# Patient Record
Sex: Female | Born: 1985 | Race: White | Hispanic: No | Marital: Single | Smoking: Former smoker
Health system: Southern US, Community
[De-identification: ages and names within clinical notes are randomized; demographics above are authoritative.]

## PROBLEM LIST (undated history)

## (undated) DIAGNOSIS — L7682 Other postprocedural complications of skin and subcutaneous tissue: Principal | ICD-10-CM

## (undated) DIAGNOSIS — R42 Dizziness and giddiness: Secondary | ICD-10-CM

## (undated) DIAGNOSIS — J45909 Unspecified asthma, uncomplicated: Secondary | ICD-10-CM

## (undated) HISTORY — DX: Unspecified asthma, uncomplicated: J45.909

## (undated) HISTORY — DX: Other postprocedural complications of skin and subcutaneous tissue: L76.82

---
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
## 2010-10-15 ENCOUNTER — Emergency Department (HOSPITAL_COMMUNITY)
Admission: EM | Admit: 2010-10-15 | Discharge: 2010-10-15 | Disposition: A | Discharge status: Home / Self Care | Payer: Medicaid Other | Attending: Emergency Medicine | Admitting: Emergency Medicine

## 2010-10-15 DIAGNOSIS — H7290 Unspecified perforation of tympanic membrane, unspecified ear: Secondary | ICD-10-CM | POA: Insufficient documentation

## 2010-10-15 DIAGNOSIS — R112 Nausea with vomiting, unspecified: Secondary | ICD-10-CM | POA: Insufficient documentation

## 2010-10-15 DIAGNOSIS — R42 Dizziness and giddiness: Secondary | ICD-10-CM | POA: Insufficient documentation

## 2010-10-15 DIAGNOSIS — R0602 Shortness of breath: Secondary | ICD-10-CM | POA: Insufficient documentation

## 2010-10-15 DIAGNOSIS — H9209 Otalgia, unspecified ear: Secondary | ICD-10-CM | POA: Insufficient documentation

## 2010-10-15 LAB — POCT PREGNANCY, URINE: Preg Test, Ur: NEGATIVE

## 2010-10-26 ENCOUNTER — Emergency Department (HOSPITAL_COMMUNITY)
Admission: EM | Admit: 2010-10-26 | Discharge: 2010-10-26 | Disposition: A | Discharge status: Home / Self Care | Payer: Medicaid Other | Attending: Emergency Medicine | Admitting: Emergency Medicine

## 2010-10-26 DIAGNOSIS — J019 Acute sinusitis, unspecified: Secondary | ICD-10-CM | POA: Insufficient documentation

## 2010-10-26 DIAGNOSIS — H9209 Otalgia, unspecified ear: Secondary | ICD-10-CM | POA: Insufficient documentation

## 2011-01-07 NOTE — H&P (Signed)
NAMEYENNY, KOSA NO.:  [id number]   MEDICAL RECORD NO.:  [id number]          PATIENT TYPE:  INP   LOCATION:  0105                          FACILITY:  [facility]   PHYSICIAN:  Beverly Milch, MD     DATE OF BIRTH:  29-Apr-1986   DATE OF ADMISSION:  08/28/2004  DATE OF DISCHARGE:                         PSYCHIATRIC ADMISSION ASSESSMENT   IDENTIFICATION:  A 25 year old female, 11th grade student at [employer] is admitted emergently involuntarily on [facility] petition  for commitment and transfer from [facility] emergency department,  Dr. Jerilynn Birkenhead for inpatient stabilization of suicide risk and depression. The  patient had presented to the emergency room apparently with father's  girlfriend after passing out when with father's girlfriend at the  girlfriend's day care job the evening of admission. The patient arrived in  emergency room at 2051 on August 27, 2004 describing syncope that was  otherwise unexplained including an episode of fainting at school in what she  considered heat the day before. However, there were no additional  contributing physical factors at the time of admission. After negative  medical workup including CT scan of the head, the patient gradually  disclosed to the emergency room physician that she was under significant  family stress and wanted to die. She had been hyperventilating and then  struck her chest resulting in syncope in order to kill herself.   HISTORY OF PRESENT ILLNESS:  The patient hesitates to address the conflicts  and consequences in her life. She seems anxious, as well as dysphoric in  this regard. She seems very stressed that she has not seen her biological  mother in 6 years, though she knows the biological mother resides in  [place]. She thinks that mother has substance abuse disorder and is now  likely sober. However, she notes that her father does see the biological  mother in [place] and  portrays that the mother is still having problems.  The patient resides with father and his girlfriend, who apparently functions  as the patient's surrogate mother. The patient suggests that she has  conflicts with the stepmother but suggests that both of them are afraid of  the father. The patient will not be more specific about her father's anger  and angry acting out. The patient, therefore, has become progressively  dysphoric in life and just wants to die. She has nightmares of being killed  and of other means of death. The patient will not discuss all these matters.  She does describe a long history of difficulty getting an adequate breath.  She reports that she has been told by biological mother that she has asthma  in the past. She is known to have had either breath-holding spells or  hyperventilation spells when she was little, with the family conclusion that  she was making herself pass out. She has gained 20 pounds in the last 2  months. She notes particular difficulty early in the morning, feeling that  she is not getting a deep enough breath. She therefore seems to describe  more generalized anxiety with somatic consequences that continue to  exacerbate over time. She does not acknowledge definite purging, but such  must be in the differential diagnosis. Overeating and binge overeating must  be considered. The patient is poorly physically kept, being overweight and  having dental malocclusion and dental caries. She states she needs to got to  the dentist, but either seems too anxious or not provided the resources to  go, likely the former. The patient does not acknowledge any drug or alcohol  use. She has had no previous mental health care. She denies hallucinations  and does not manifest paranoia or delusions. She is not manifesting mania or  definite dissociation.   PAST MEDICAL HISTORY:  The patient reports a weight gain of 20 pounds over  the last 2 months. She has striae  on axillae and the inguinal areas. She has  dental caries and dental malocclusion. She wears contact lenses and reports  that she has failed a hearing test at school in the past due to something  wrong with her eardrum. She reports a history of possible asthma, as well as  apparent breath-holding or hyperventilation episodes. Her last menses was 1  week ago,  and she is not sexually active. In the emergency room prior to  transfer, she had a CT scan of the head that was negative. Her hemoglobin  was low as was her hematocrit with microcytic indices, but she did not have  the differential and her overall RBC count was adequate. She had a large  amount of blood in her urinalysis with 5-10 RBCs, and her last menses was 1  week ago. The patient is on no medications.   SHE HAS NO MEDICATION ALLERGIES.   She has had no definite seizure. She has had no definite heart murmur or  arrhythmia. She did not have an EKG or an arterial blood gas in the  emergency room. She did not present other definite medical differentials at  that time.   REVIEW OF SYSTEMS:  The patient denies difficulty with gait, gaze or  continence. She denies exposure to communicable disease or toxins. She  denies rash, jaundice or purpura. There is no chest pain, palpitations, or  current dyspnea. There is no abdominal pain, nausea, vomiting or diarrhea.  There is no dysuria or arthralgia.   IMMUNIZATIONS:  Up-to-date.   FAMILY HISTORY:  The patient's biological mother apparently dropped the  patient off at father's house 6 years ago, and the patient has not seen her  mother again. She thinks that biological father does see the mother in  [place] but suggests that mother is still having problems. The patient  thinks her mother likely had drug abuse but is now doing better. Father's  girlfriend serves as a surrogate mother for the patient. The patient suggests conflict with the father's girlfriend but even more with  father.  She suggests that both of them are afraid of father who has anger problems.  The father's girlfriend reports the same. They do not acknowledge other  definite family history of major psychiatric disorder.   SOCIAL AND DEVELOPMENTAL HISTORY:  The patient is in the 11th grade of  [employer]. She indicates that others were  concerned when she  fainted at school the day before admission and suggested that a fan was  placed on her. The patient does not acknowledge any alcohol or drug use. She  denies sexual activity. She does not acknowledge any other legal  consequences or illegal behaviors. She seems significantly anxious  particularly about  further care such as dental care or the content of  therapy.   ASSETS:  The patient is respectful   MENTAL STATUS EXAM:  Height is 66-1/4 inches and weight is 196 pounds. The  patient reporting a 20-pound weight gain the last 2 months. Blood pressure  is 138/72 with heart rate of 76 sitting and 137/80 with heart rate of 87  standing. The patient is right-handed. She is alert and oriented with speech  intact. Cranial nerves and AMRs are 0/0. Muscle strength and tone are  normal. There is no pathologic reflexes or soft neurologic findings. There  is no abnormal involuntary movements. Gait and gaze are intact. The patient  has low self-esteem and an avoidant gaze. She seems at least moderately to  severely anxious in a generalized fashion, particularly about her breathing.  However, she acknowledges that her syncope was a self-injury more than an  anxiety. She describes pounding herself in the chest rather than throwing  herself on the floor, at this time though she has espoused both mechanisms  prior to arrival. She suggests that she was hyperventilating and then hit  herself in the chest, and it made her lose consciousness. The patient  indicates that she wanted to die. She suggested life is too stressful and  her problems too  significant to go on. She suggests that she and surrogate  mother, who is father's girlfriend, are afraid of father. The patient is  overeating. Her sleep is preserved and likely excessive. She has diminished  concentration and seems hopeless. She has severe dysphoria but states she is  stressed. She has moderate to severe generalized anxiety. She has not been  assaultive or homicidal, but she has been suicidal and self-destructive. She  has no manic diathesis. She has no dissociation or psychosis.   IMPRESSION:   AXIS I:  1.  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  2.  Generalized anxiety disorder.  3.  Rule out eating disorder not otherwise specified (provisional      diagnosis).  4.  Parent/child problem.  5.  Other specified family circumstances.   AXIS II:  Diagnosis deferred.   AXIS III:  1.  Syncope likely vagal associated with hyperventilation and chest      compression 2.  Overweight.  3.  Dental caries and dental malocclusion  4.  History of asthma.  5.  Contact lenses.  6.  Diminished hearing on school testing in the past.  7.  Microcytic anemia, etiology uncertain.   AXIS IV:  Family - extreme acute and chronic; medical - moderate, acute and  chronic; phase of life -  severe acute and chronic.   AXIS V:  Global assessment of functioning 38 with highest in the last year  74.   PLAN:  The patient is admitted for inpatient adolescent psychiatric and  multidisciplinary, multimodal behavioral treatment in team-based program at  a locked psychiatric unit. It is necessary to first document psychiatric  targets for treatment as well as medical stability. An EKG, serum iron and  binding capacity, and CBC with diff are indicated. Cognitive behavioral  therapy, mobilization of trauma and grief for desensitization and working  through, anger management, self-esteem and self concept, family and parent  management training therapies  are all considered.   ESTIMATED LENGTH OF STAY:  Seven days with target symptoms for discharge  being stabilization of suicide risk and mood, stabilization of anxiety and  associated risk  taking and generalization of the capacity for safe effect  this patient outpatient treatment and in the family.     Glen   GJ/MEDQ  D:  08/28/2004  T:  08/28/2004  Job:  [id number]

## 2011-01-07 NOTE — Discharge Summary (Signed)
NAMESIRA, ADSIT              ACCOUNT NO.:  [id number]   MEDICAL RECORD NO.:  [id number]          PATIENT TYPE:  INP   LOCATION:  0105                          FACILITY:  [facility]   PHYSICIAN:  Beverly Milch, MD     DATE OF BIRTH:  03-Jan-1986   DATE OF ADMISSION:  08/28/2004  DATE OF DISCHARGE:  09/03/2004                                 DISCHARGE SUMMARY   ADOLESCENT PSYCHIATRIC DISCHARGE SUMMARY.   IDENTIFICATION:  A 25 year old female, 11th grade student at [employer] was admitted emergently, involuntarily, [facility] petition  for commitment in transfer from [facility] Emergency Room for  inpatient stabilization of suicide risk and depression. The patient had  presented to the emergency room for syncopal episode in the presence of  father's girlfriend at the girlfriend's day care evening job. The patient  had reportedly fainted the day before at school and minimize the  significance of that episode. The emergency room found no abnormalities  including on CT scan of the head and in presenting that to the patient, the  patient clarified that she wanted to die and had hyperventilated and struck  herself in the chest in order to kill herself. For full details, please see  the typed admission assessment.   SYNOPSIS OF PRESENT ILLNESS:  The patient was initially angry and suspicious  about the treatment process seeming to suggest that she just needed to be  released. However, she quickly engaged and began to open up about the  problems. She and father presented differing histories, about the patient's  contact with biological mother, who reportedly left the patient with father  6 years ago to continue her life of drug abuse in [place]. Father seems  angry and disengaging, subsequently, and he declines to reside the nurturing  that a father and especially a mother jointly would provide. The patient is  stressed, therefore, by father and father's girlfriend;  and expects more  than they can provide but they do not provide what can be expected. The  patient will not talk over the problems. She has gained 20 pounds in the  last 2 months. She has nightmares of being killed and other means of death.  Biological mother told her she had asthma in the past. She has overeating,  without definite binge eating, or other eating disorder symptoms. She has  dental caries and dental malocclusion. She has no difficulty talking about  the emotional consequences of her object loss. She had a large amount of  occult blood in her urinalysis in the emergency room; and last menses  started 1 week ago. The patient is not sexually active; and she denies any  use of alcohol or illicit drugs.   INITIAL MENTAL STATUS EXAM:  The patient had moderate to severe generalized  anxiety particular about her breathing. She had low self-esteem and was  significantly avoidant; and, at times, irritable while obviously being in  need of nurturing. She reports life is to stressful and her problems are too  significant to keep going. She suggests that she and father's girlfriend are  afraid  of the father. She has diminished concentration and presents in a  hopeless style with severe dysphoria. She has no manic diathesis and no  evidence of dissociative or psychotic symptoms.   LABORATORY FINDINGS:  In [facility] [place] Emergency Department, CBC noted  hemoglobin low at 10.5, with lower limit of normal 11.5; hematocrit low at  30.9, with lower limit of normal 34; an MCV low at 73.3, with lower limit of  normal 80; while MCHC was 24.8, with lower limit of normal 27. White count  was normal at 9400 and platelet count 308,000. Basic metabolic panel was  normal including random glucose 99, sodium 136, potassium 3.6, creatinine  0.8 and calcium 9.2. Urine drug screen was negative. Urinalysis had a large  amount of occult blood with specific gravity of 1.025, otherwise negative  dip stick,  with 0-5 WBC and 0-5 epithelial cells, and 5-10 RBC, with small  amount of bacteria and yeast. Urine pregnancy test was negative.   At the [facility], repeat CBC revealed hemoglobin 11.1, with  lower limit of normal 12; hematocrit 34.5, with lower limit of normal 36;  and MCV 75.2, with lower limit of normal 82. White count was slightly  elevated at 10,700 with upper limit of normal 10,000 and platelet count at  331,000 with upper limit of normal 325,000. Hepatic function panel was  normal except albumin low at 3.1 with reference range 3.5-5.2. AST was  normal at 15, ALT 11, and GGT 12 with total protein 7.6. Free T4 was normal  1.04 and TSH at 1.287. Serum iron was normal at 62 with reference range 42-  135; and TIBC was normal at 373 mcg/dL with reference range 045-409.  However, percent saturation was low at 17% with reference range 20-55. RPR  was nonreactive. Urine probe for gonorrhea and chlamydia trichomatous by DNA  amplification were both negative.   HOSPITAL COURSE AND TREATMENT:  General medical exam by Vic Ripper,  P.A.C. noted no medication allergies. The patient reported at that time that  home in school were all right though she gets bored at school. She had a  cafe-au-lait pigmentation on the right posterior leg. She had menarche at  age 60 with regular menses and confirmed that she is not sexually active.  She was Tanner stage V. She was significantly overweight with height of 66-  1/4 inches and weight of 196 pounds with blood pressure on admission 138/72  with heart rate of 76 sitting and 137/80 with heart rate of 87 standing.  Final weight was 197-1/2 pounds. Final blood pressure was 108/61 with heart  rate of 80 supine and 121/67 with heart rate of 99 standing. The patient was  started on a multivitamin with iron. She did receive Anusol-HC suppositories b.i.d. for symptomatic internal hemorrhoids during hospital stay. She also  received Lotrimin  cream 2% to the intertriginous area of the toes  bilaterally and inguinal folds for a fungal dermatitis and she did have  yeast in her urine. The patient tolerated these therapies well and had  significant improvement in such symptoms. She did have an electrocardiogram  during hospital stay relative to possible need for cycle pharmacotherapy;  and none was performed in the emergency room for her syncopal symptoms. Her  EKG was normal sinus rhythm, normal EKG, with rate of 82, PR of 164, QRS of  92 and QTC 425 milliseconds. Father declined Prozac pharmacotherapy, once  the patient agreed to for Prozac, though the patient required 3  days to  reach this conclusion herself. The patient participated actively in  cognitive behavior among all other treatment program therapies. She made  excellent list of the strengths and weaknesses of relationships with father  and father's girlfriend. As she addressed all these issues, she became more  depressed and anxious particularly about returning to this living  environment. At one point she concluded she could not return to father's  home and would have to runaway or work out an alternative with father.  However, she did not talk to father about the visit of his sister and the  patient's apparent hope that she could move into the sister's home. The  patient at times would subsequently conclude that she could make it at  father's, and then at other times, that she could not. She and father did  talk more in the family therapy session about the status of the patient's  mother in [place].  However, father declined to become more nurturing to  the patient.  He sought only to extend parental decisions without  reciprocating all of her concerns otherwise. The patient was stressed by  such interfamily therapy work, though at the same time, somewhat validated  for understanding, at least partly why she became depressed. However, the  object loss of mother may  be even more significant.   Father reported that DSS got involved with the patient's mother when the  patient was 76 years of age; father concluded the patient does not like  following the rules. He felt that drug abuse was multigenerational on the  maternal side of the patient's family. Father considered himself married the  patient's stepmother rather than living with girlfriend; as the patient  formulated it. These issues could be mobilized but not resolved. However,  the patient did seem to feel an function better gaining an understanding of  the problem. I clarified to father, the need for ongoing family therapy, in  addition to individual therapy, particularly if father is not going to allow  the Prozac pharmacotherapy. The prescription for Prozac was sent with father  at the time of discharge, particularly as he manifested a lack of concern and interest, while stating that he would attend after care. The patient did  have another fainting episode during school on the day prior to discharge.  School staff and nursing had no concerns that this represented a medical  abnormality, but rather reassured the patient and worked with her on the  anxious and somatoform symptoms. The patient was able to use such nurturing  to resolve any symptoms without other testing or consequences. This is felt  to be part of the patient's termination phase of treatment. She otherwise  participated in group, milieu, behavioral, individual, special education,  occupational and therapeutic recreational, anger management, and substance  abuse prevention therapies. Suicidal ideation did not recur; and the patient  was discharged in improved condition.   FINAL DIAGNOSES   AXIS I:  1.  Major depression, single episode, moderate-to-severe with atypical      features.  2.  Generalized anxiety disorder.  3.  Parent child problem.  4.  Other specified family circumstances.   AXIS II:  Diagnosis deferred.    AXIS III:  1.  Conversion-type fainting.  2.  Hyperventilation and chest compression, self-injurious behavior.  3.  Overweight.  4.  Dental caries and dental malocclusion  5.  History of possible asthma.  6.  Occult hematuria likely residual of menses.  7.  Contact lenses.  8.  Diminished hearing on school testing in the past.  9.  Macrocytic anemia and hypoalbuminemia likely iron deficiency and      nutritional  10. Tinea pedis and corpus with a few yeast in urinalysis  11. Internal hemorrhoids.   AXIS IV:  Stressors family extreme, acute and chronic; medical moderate,  acute and chronic; phase of life severe, acute and chronic.   AXIS V:  GAF on admission 38 with highest in the last year 74 and discharge  GAF was 53.   PLAN:  The patient did not manifest symptoms of an eating disorder during  hospitalization, though she is overweight. Behavioral nutrition was  addressed. Prozac was offered, but father would not agree even though the  patient concluded that would be helpful.   DISCHARGE MEDICATIONS:  The patient was discharged on the following  medications:  1.  Multivitamin with iron to take 1 daily over the counter.  2.  Lotrimin cream, current supply, to apply twice daily to interdigital      toes and inguinal folds until rash resolved.  3.  Anusol-HC suppository b.i.d. for hemorrhoids resolved symptoms and no      further needed.  4.  Loxitane 20 mg to take one daily, prescription written, and can be      filled by father and patient if they find that counseling alone cannot      stabilize the self-sustaining depressive symptoms from lost      relationships, with 1 refill. They will see Florencia Reasons for individual      and family therapy at [facility] in      [place] on September 07, 2004 at 10:15. Crisis and safety plans are      outlined if needed. Iron and     protein sources in the diet are clarified; and the patient is encouraged       to be physically active and reduce weight. She required no seclusion,      restraint or equivalent such during hospital stay as documented at the      request of nursing administration     Glen   GJ/MEDQ  D:  09/06/2004  T:  09/06/2004  Job:  [id number]   cc:   Valinda Hoar [phone] [facility] -- [place]  [address]  [place]  [place]

## 2011-05-16 ENCOUNTER — Emergency Department (HOSPITAL_COMMUNITY)
Admission: EM | Admit: 2011-05-16 | Discharge: 2011-05-17 | Disposition: A | Discharge status: Home / Self Care | Payer: Self-pay | Attending: Emergency Medicine | Admitting: Emergency Medicine

## 2011-05-16 ENCOUNTER — Encounter: Payer: Self-pay | Admitting: Emergency Medicine

## 2011-05-16 DIAGNOSIS — F17200 Nicotine dependence, unspecified, uncomplicated: Secondary | ICD-10-CM | POA: Insufficient documentation

## 2011-05-16 DIAGNOSIS — H9209 Otalgia, unspecified ear: Secondary | ICD-10-CM | POA: Insufficient documentation

## 2011-05-16 DIAGNOSIS — J329 Chronic sinusitis, unspecified: Secondary | ICD-10-CM | POA: Insufficient documentation

## 2011-05-16 DIAGNOSIS — R42 Dizziness and giddiness: Secondary | ICD-10-CM

## 2011-05-16 DIAGNOSIS — R51 Headache: Secondary | ICD-10-CM | POA: Insufficient documentation

## 2011-05-16 DIAGNOSIS — J3489 Other specified disorders of nose and nasal sinuses: Secondary | ICD-10-CM | POA: Insufficient documentation

## 2011-05-16 NOTE — ED Notes (Signed)
Patient c/o dizziness, nausea and left ear pain x one week.

## 2011-05-16 NOTE — ED Notes (Signed)
Alert, nausea,without vomiting,  Pain rt ear.

## 2011-05-17 MED ORDER — ONDANSETRON HCL 4 MG PO TABS
4.0000 mg | ORAL_TABLET | Freq: Once | ORAL | Status: AC
  Administered 2011-05-17: 4 mg via ORAL
  Filled 2011-05-17: qty 1

## 2011-05-17 MED ORDER — ONDANSETRON HCL 4 MG PO TABS: 4.0000 mg | ORAL_TABLET | Freq: Three times a day (TID) | ORAL | Status: AC | PRN

## 2011-05-17 MED ORDER — FEXOFENADINE-PSEUDOEPHED ER 60-120 MG PO TB12: 1.0000 | ORAL_TABLET | Freq: Two times a day (BID) | ORAL | Status: DC

## 2011-05-17 MED ORDER — PSEUDOEPHEDRINE HCL 60 MG PO TABS
60.0000 mg | ORAL_TABLET | Freq: Once | ORAL | Status: AC
  Administered 2011-05-17: 60 mg via ORAL
  Filled 2011-05-17: qty 1

## 2011-05-17 MED ORDER — PREDNISONE 10 MG PO TABS: 20.0000 mg | ORAL_TABLET | Freq: Every day | ORAL | Status: DC

## 2011-05-17 MED ORDER — PREDNISONE 10 MG PO TABS: 20.0000 mg | ORAL_TABLET | Freq: Every day | ORAL | Status: AC

## 2011-05-17 MED ORDER — PREDNISONE 20 MG PO TABS
60.0000 mg | ORAL_TABLET | Freq: Once | ORAL | Status: AC
  Administered 2011-05-17: 60 mg via ORAL
  Filled 2011-05-17: qty 3

## 2011-05-17 MED ORDER — ONDANSETRON HCL 4 MG PO TABS: 4.0000 mg | ORAL_TABLET | Freq: Four times a day (QID) | ORAL | Status: DC

## 2011-05-17 NOTE — ED Provider Notes (Signed)
History     CSN: [id number] Arrival date & time: 05/16/2011 11:44 PM  Chief Complaint  Patient presents with  . Nausea  . Otalgia    HPI  (Consider location/radiation/quality/duration/timing/severity/associated sxs/prior treatment)  Patient is a 25 y.o. female presenting with ear pain. The history is provided by the patient.  Otalgia This is a new problem. The current episode started more than 1 week ago. There is pain in the left ear. The problem occurs daily. The problem has not changed since onset.Associated symptoms include headaches, rhinorrhea and cough. Pertinent negatives include no abdominal pain and no neck pain. Her past medical history does not include chronic ear infection.    History reviewed. No pertinent past medical history.  Past Surgical History  Procedure Date  . Cesarean section     No family history on file.  History  Substance Use Topics  . Smoking status: Current Everyday Smoker -- 0.5 packs/day  . Smokeless tobacco: Not on file  . Alcohol Use: Yes     occ    OB History    Grav Para Term Preterm Abortions TAB SAB Ect Mult Living                  Review of Systems  Review of Systems  Constitutional: Negative for activity change.       All ROS Neg except as noted in HPI  HENT: Positive for ear pain and rhinorrhea. Negative for nosebleeds and neck pain.   Eyes: Negative for photophobia and discharge.  Respiratory: Positive for cough. Negative for shortness of breath and wheezing.   Cardiovascular: Negative for chest pain and palpitations.  Gastrointestinal: Negative for abdominal pain and blood in stool.  Genitourinary: Negative for dysuria, frequency and hematuria.  Musculoskeletal: Negative for back pain and arthralgias.  Skin: Negative.   Neurological: Positive for headaches. Negative for dizziness, seizures and speech difficulty.  Psychiatric/Behavioral: Negative for hallucinations and confusion.    Allergies  Review of patient's  allergies indicates no known allergies.  Home Medications   Current Outpatient Rx  Name Route Sig Dispense Refill  . ETONOGESTREL 68 MG [place] IMPL Subcutaneous Inject into the skin once.        Physical Exam    BP 133/78  Pulse 80  Temp(Src) 98.6 F (37 C) (Oral)  Resp 20  Ht 5\' 7"  (1.702 m)  Wt 200 lb (90.719 kg)  BMI 31.32 kg/m2  SpO2 100%  LMP 05/13/2011  Physical Exam  Nursing note and vitals reviewed. Constitutional: She is oriented to person, place, and time. She appears well-developed and well-nourished.  Non-toxic appearance.  HENT:  Head: Normocephalic.  Right Ear: Tympanic membrane and external ear normal.  Left Ear: Tympanic membrane and external ear normal.       Nasal congestion noted. Mild increase redness of the posterior pharynx.  Eyes: EOM and lids are normal. Pupils are equal, round, and reactive to light.  Neck: Normal range of motion. Neck supple. Carotid bruit is not present.  Cardiovascular: Normal rate, regular rhythm, normal heart sounds, intact distal pulses and normal pulses.   Pulmonary/Chest: Breath sounds normal. No respiratory distress.  Abdominal: Soft. Bowel sounds are normal. There is no tenderness. There is no guarding.  Musculoskeletal: Normal range of motion.  Lymphadenopathy:       Head (right side): No submandibular adenopathy present.       Head (left side): No submandibular adenopathy present.    She has no cervical adenopathy.  Neurological: She is alert  and oriented to person, place, and time. She has normal strength. No cranial nerve deficit or sensory deficit.  Skin: Skin is warm and dry.  Psychiatric: She has a normal mood and affect. Her speech is normal.    ED Course  Procedures (including critical care time)  Labs Reviewed - No data to display No results found.   Dx: Sinusitis  MDM I have reviewed nursing notes, vital signs, and all appropriate lab and imaging results for this patient.        Kathie Dike,  [place] 05/17/11 0030

## 2011-05-17 NOTE — ED Provider Notes (Addendum)
Medical screening examination/treatment/procedure(s) were performed by non-physician practitioner and as supervising physician I was immediately available for consultation/collaboration.  Nicholes Stairs, MD 05/17/11 1610  Nicholes Stairs, MD 05/17/11 [phone]

## 2011-10-25 ENCOUNTER — Emergency Department (HOSPITAL_COMMUNITY)
Admission: EM | Admit: 2011-10-25 | Discharge: 2011-10-25 | Disposition: A | Discharge status: Home / Self Care | Payer: Medicaid Other | Attending: Emergency Medicine | Admitting: Emergency Medicine

## 2011-10-25 ENCOUNTER — Encounter (HOSPITAL_COMMUNITY): Payer: Self-pay | Admitting: Emergency Medicine

## 2011-10-25 ENCOUNTER — Emergency Department (HOSPITAL_COMMUNITY): Payer: Medicaid Other

## 2011-10-25 DIAGNOSIS — R1011 Right upper quadrant pain: Secondary | ICD-10-CM | POA: Insufficient documentation

## 2011-10-25 DIAGNOSIS — R1110 Vomiting, unspecified: Secondary | ICD-10-CM

## 2011-10-25 DIAGNOSIS — R112 Nausea with vomiting, unspecified: Secondary | ICD-10-CM | POA: Insufficient documentation

## 2011-10-25 DIAGNOSIS — R1013 Epigastric pain: Secondary | ICD-10-CM | POA: Insufficient documentation

## 2011-10-25 DIAGNOSIS — F17200 Nicotine dependence, unspecified, uncomplicated: Secondary | ICD-10-CM | POA: Insufficient documentation

## 2011-10-25 MED ORDER — HYDROCODONE-ACETAMINOPHEN 5-325 MG PO TABS: 1.0000 | ORAL_TABLET | ORAL | Status: AC | PRN

## 2011-10-25 MED ORDER — ONDANSETRON HCL 4 MG/2ML IJ SOLN
4.0000 mg | Freq: Once | INTRAMUSCULAR | Status: AC
  Administered 2011-10-25: 4 mg via INTRAVENOUS
  Filled 2011-10-25: qty 2

## 2011-10-25 MED ORDER — FAMOTIDINE IN NACL 20-0.9 MG/50ML-% IV SOLN
20.0000 mg | Freq: Once | INTRAVENOUS | Status: AC
  Administered 2011-10-25: 20 mg via INTRAVENOUS
  Filled 2011-10-25: qty 50

## 2011-10-25 MED ORDER — HYDROMORPHONE HCL PF 1 MG/ML IJ SOLN
1.0000 mg | Freq: Once | INTRAMUSCULAR | Status: AC
  Administered 2011-10-25: 1 mg via INTRAVENOUS
  Filled 2011-10-25: qty 1

## 2011-10-25 MED ORDER — SODIUM CHLORIDE 0.9 % IV SOLN
Freq: Once | INTRAVENOUS | Status: AC
  Administered 2011-10-25: 1000 mL via INTRAVENOUS

## 2011-10-25 MED ORDER — PROMETHAZINE HCL 25 MG PO TABS: 12.5000 mg | ORAL_TABLET | Freq: Four times a day (QID) | ORAL | Status: DC | PRN

## 2011-10-25 NOTE — ED Provider Notes (Signed)
History   This chart was scribed for [employer]. Colon Branch, MD by Clarita Crane. The patient was seen in room APA01/APA01. Patient's care was started at 1024.    CSN: [id number]  Arrival date & time 10/25/11  1024   First MD Initiated Contact with Patient 10/25/11 1156      Chief Complaint  Patient presents with  . Abdominal Pain    (Consider location/radiation/quality/duration/timing/severity/associated sxs/prior treatment) HPI Robin Perez is a 26 y.o. female who presents to the Emergency Department complaining of constant moderate epigastric abdominal pain with associated nausea and 1 episode of vomiting onset last night and persistent since. Patient states abdominal pain is aggravated by bending over. Reports last bowel movement occurred this morning and notes she had mild pain with BM. States she last ate last night just prior to onset of abdominal pain. Denies fever, chills, diarrhea, SOB, chest pain. Patient with h/o c-section.   History reviewed. No pertinent past medical history.  Past Surgical History  Procedure Date  . Cesarean section     No family history on file.  History  Substance Use Topics  . Smoking status: Current Everyday Smoker -- 0.5 packs/day  . Smokeless tobacco: Not on file  . Alcohol Use: Yes     occ    OB History    Grav Para Term Preterm Abortions TAB SAB Ect Mult Living                  Review of Systems 10 Systems reviewed and are negative for acute change except as noted in the HPI.  Allergies  Review of patient's allergies indicates no known allergies.  Home Medications   Current Outpatient Rx  Name Route Sig Dispense Refill  . ETONOGESTREL 68 MG [place] IMPL Subcutaneous Inject into the skin once.        BP 117/74  Pulse 69  Temp 98.3 F (36.8 C)  Resp 20  Ht 5\' 7"  (1.702 m)  Wt 211 lb (95.709 kg)  BMI 33.05 kg/m2  SpO2 98%  LMP 10/25/2011  Physical Exam  Nursing note and vitals reviewed. Constitutional: She is oriented to  person, place, and time. She appears well-developed and well-nourished. No distress.  HENT:  Head: Normocephalic and atraumatic.  Eyes: EOM are normal. Pupils are equal, round, and reactive to light.  Neck: Neck supple. No tracheal deviation present.  Cardiovascular: Normal rate and regular rhythm.  Exam reveals no gallop.   No murmur heard. Pulmonary/Chest: Effort normal. No respiratory distress. She has no wheezes. She has no rales.  Abdominal: Soft. She exhibits no distension. There is tenderness (epigastric).  Musculoskeletal: Normal range of motion. She exhibits no edema.  Neurological: She is alert and oriented to person, place, and time. No sensory deficit.  Skin: Skin is warm and dry.  Psychiatric: She has a normal mood and affect. Her behavior is normal.    ED Course  Procedures (including critical care time)  DIAGNOSTIC STUDIES: Oxygen Saturation is 98% on room air, normal by my interpretation.    COORDINATION OF CARE: 12:25PM- Patient informed of current plan for treatment and evaluation and agrees with plan at this time.  1:50PM- Patient notes pain has improved at this time. Still waiting on results of abdominal ultrasound.    Labs Reviewed - No data to display US Abdomen Limited Ruq  10/25/2011  *RADIOLOGY REPORT*  Clinical Data:  26 year old female with right upper quadrant pain.  LIMITED ABDOMINAL ULTRASOUND - RIGHT UPPER QUADRANT  Comparison:  None.  Findings:  Gallbladder:  Shadowing echogenic gallstone within the gallbladder measures 17 mm in diameter.  Gallbladder wall thickness remains normal limits at 2 mm.  No sonographic Murphy's sign or pericholecystic fluid.  Common bile duct:  Normal measuring 3 mm in diameter.  Liver:  Within normal limits for size and echotexture.  No intrahepatic biliary ductal dilatation.  IMPRESSION: Cholelithiasis (17 mm diameter stone) without sonographic evidence of acute cholecystitis.                   Original Report Authenticated By:  Harley Hallmark, M.D.   MDM  Patient with nausea, vomiting and RUQ and epigastric pain. Given IVF, analgesics, antiemetic with resolution. [place] without evidence of gall bladder disease. Pt feels improved after observation and/or treatment in ED.Pt stable in ED with no significant deterioration in condition.The patient appears r easonably screened and/or stabilized for discharge and I doubt any other medical condition or other [facility] requiring further screening, evaluation, or treatment in the ED at this time prior to discharge.  I personally performed the services described in this documentation, which was scribed in my presence. The recorded information has been reviewed and considered.   MDM Reviewed: nursing note and vitals Interpretation: labs and ultrasound          Nicoletta Dress. Colon Branch, MD 10/26/11 [phone]

## 2011-10-25 NOTE — ED Notes (Signed)
Pt c/o mid epigastric pain since last night with n. Denies v.

## 2011-10-25 NOTE — Discharge Instructions (Signed)
Your ultrasound did NOT show that your gall bladder was the cause of your pain. The most likely cause is a viral illness. Use the nausea and pain medicine as needed. Drink lots of fluids. Eat a bland diet for the next 6-8 ours then progress as you can tolerate. Use BRAT if you develop diarrhea.

## 2011-10-29 ENCOUNTER — Emergency Department (HOSPITAL_COMMUNITY)
Admission: EM | Admit: 2011-10-29 | Discharge: 2011-10-29 | Disposition: A | Discharge status: Home / Self Care | Payer: Medicaid Other | Attending: Emergency Medicine | Admitting: Emergency Medicine

## 2011-10-29 ENCOUNTER — Encounter (HOSPITAL_COMMUNITY): Payer: Self-pay | Admitting: *Deleted

## 2011-10-29 ENCOUNTER — Emergency Department (HOSPITAL_COMMUNITY): Payer: Medicaid Other

## 2011-10-29 DIAGNOSIS — F17200 Nicotine dependence, unspecified, uncomplicated: Secondary | ICD-10-CM | POA: Insufficient documentation

## 2011-10-29 DIAGNOSIS — M940 Chondrocostal junction syndrome [Tietze]: Secondary | ICD-10-CM | POA: Insufficient documentation

## 2011-10-29 DIAGNOSIS — R109 Unspecified abdominal pain: Secondary | ICD-10-CM | POA: Insufficient documentation

## 2011-10-29 LAB — BASIC METABOLIC PANEL
BUN: 12 mg/dL (ref 6–23)
Chloride: 103 mEq/L (ref 96–112)
Creatinine, Ser: 0.72 mg/dL (ref 0.50–1.10)
GFR calc Af Amer: >90 mL/min (ref >90)
GFR calc non Af Amer: >90 mL/min (ref >90)
Glucose, Bld: 92 mg/dL (ref 70–99)

## 2011-10-29 LAB — URINALYSIS, ROUTINE W REFLEX MICROSCOPIC
Ketones, ur: NEGATIVE mg/dL
Leukocytes, UA: NEGATIVE
Protein, ur: NEGATIVE mg/dL
Urobilinogen, UA: 0.2 mg/dL (ref 0.0–1.0)

## 2011-10-29 LAB — URINE MICROSCOPIC-ADD ON

## 2011-10-29 LAB — PREGNANCY, URINE: Preg Test, Ur: NEGATIVE

## 2011-10-29 MED ORDER — KETOROLAC TROMETHAMINE 30 MG/ML IJ SOLN
30.0000 mg | Freq: Once | INTRAMUSCULAR | Status: AC
  Administered 2011-10-29: 30 mg via INTRAVENOUS
  Filled 2011-10-29: qty 1

## 2011-10-29 MED ORDER — OXYCODONE-ACETAMINOPHEN 5-325 MG PO TABS: 2.0000 | ORAL_TABLET | ORAL | Status: AC | PRN

## 2011-10-29 MED ORDER — SODIUM CHLORIDE 0.9 % IV SOLN
INTRAVENOUS | Status: DC
  Administered 2011-10-29: 1000 mL via INTRAVENOUS

## 2011-10-29 NOTE — ED Notes (Addendum)
Pt c/o pain when she takes a deep breath and pt states she feels like she has to burp and when she does it hurts.

## 2011-10-29 NOTE — ED Notes (Signed)
Pt alert & oriented x4, stable gait. Pt given discharge instructions, paperwork & prescription(s). Patient instructed to stop at the registration desk to finish any additional paperwork. pt verbalized understanding. Pt left department w/ no further questions.  

## 2011-10-29 NOTE — ED Provider Notes (Signed)
History     CSN: [id number]  Arrival date & time 10/29/11  2725   First MD Initiated Contact with Patient 10/29/11 0344      Chief Complaint  Patient presents with  . Abdominal Pain    (Consider location/radiation/quality/duration/timing/severity/associated sxs/prior treatment) Patient is a 26 y.o. female presenting with abdominal pain. The history is provided by the patient.  Abdominal Pain The primary symptoms of the illness include fever and shortness of breath. The primary symptoms of the illness do not include abdominal pain, nausea, vomiting or dysuria.  Symptoms associated with the illness do not include chills.   The patient is a 26 year old, female, with no significant past medical history.  She smokes cigarettes and has birth control implanted in her left upper arm.  She complains of chest pain for a few days with shortness of breath.  She states the chest.  Pain increases when she takes a deep inspiration.  She has not had a cough.  2 days ago.  She did have a fever.  She denies leg pain, swelling, recent travel or surgery.  She has never had this before.  She denies nausea, vomiting, or urinary tract symptoms. History reviewed. No pertinent past medical history.  Past Surgical History  Procedure Date  . Cesarean section     History reviewed. No pertinent family history.  History  Substance Use Topics  . Smoking status: Current Everyday Smoker -- 0.5 packs/day  . Smokeless tobacco: Not on file  . Alcohol Use: Yes     occ    OB History    Grav Para Term Preterm Abortions TAB SAB Ect Mult Living                  Review of Systems  Constitutional: Positive for fever. Negative for chills.  Respiratory: Positive for shortness of breath. Negative for cough.   Cardiovascular: Positive for chest pain. Negative for palpitations.  Gastrointestinal: Negative for nausea, vomiting and abdominal pain.  Genitourinary: Negative for dysuria.  Neurological: Negative for  headaches.  All other systems reviewed and are negative.    Allergies  Review of patient's allergies indicates no known allergies.  Home Medications   Current Outpatient Rx  Name Route Sig Dispense Refill  . ETONOGESTREL 68 MG [place] IMPL Subcutaneous Inject into the skin once.      Marland Kitchen HYDROCODONE-ACETAMINOPHEN 5-325 MG PO TABS Oral Take 1 tablet by mouth every 4 (four) hours as needed for pain. 15 tablet 0  . PROMETHAZINE HCL 25 MG PO TABS Oral Take 0.5 tablets (12.5 mg total) by mouth every 6 (six) hours as needed for nausea. 10 tablet 0    BP 130/77  Pulse 81  Temp(Src) 97.8 F (36.6 C) (Oral)  Resp 20  Wt 211 lb (95.709 kg)  SpO2 100%  LMP 10/25/2011  Physical Exam  Vitals reviewed. Constitutional: She is oriented to person, place, and time.       Morbidly obese  HENT:  Head: Normocephalic and atraumatic.  Eyes: Conjunctivae are normal. Pupils are equal, round, and reactive to light.  Neck: Normal range of motion. Neck supple.  Cardiovascular: Normal rate.   No murmur heard. Pulmonary/Chest: Effort normal. No respiratory distress. She has no rales. She exhibits tenderness.       Bilateral parasternal tenderness  Abdominal: Soft. Bowel sounds are normal. She exhibits no distension. There is tenderness. There is no rebound and no guarding.  Musculoskeletal: Normal range of motion. She exhibits no edema and no tenderness.  Neurological: She is alert and oriented to person, place, and time. No cranial nerve deficit.  Skin: Skin is warm and dry.  Psychiatric: She has a normal mood and affect.    ED Course  Procedures (including critical care time) 26 year old smoker with intradermal birth control.  Presents with pleuritic chest pain, and shortness breath for several days.  Her physical examination is normal.  We will perform a d-dimer, and chest x-ray, for further evaluation and treat her symptoms with Toradol.   Labs Reviewed  URINALYSIS, ROUTINE W REFLEX MICROSCOPIC    PREGNANCY, URINE  BASIC METABOLIC PANEL  D-DIMER, QUANTITATIVE   No results found.   No diagnosis found.    MDM  Costochondritis No pneumonia.  No evidence of pulmonary embolism.  No respiratory distress or toxicity . She does not have urinary tract symptoms.  And there are epithelial cells in her urinalysis.  We will culture her urine rather than put her on an Rx at this time.        Cheri Guppy, MD 10/29/11 [phone]

## 2011-10-30 LAB — URINE CULTURE: Culture  Setup Time: 201303092057

## 2012-01-22 ENCOUNTER — Emergency Department (HOSPITAL_COMMUNITY)
Admission: EM | Admit: 2012-01-22 | Discharge: 2012-01-22 | Disposition: A | Discharge status: Home / Self Care | Payer: Medicaid Other | Attending: Emergency Medicine | Admitting: Emergency Medicine

## 2012-01-22 ENCOUNTER — Encounter (HOSPITAL_COMMUNITY): Payer: Self-pay | Admitting: *Deleted

## 2012-01-22 DIAGNOSIS — R110 Nausea: Secondary | ICD-10-CM | POA: Insufficient documentation

## 2012-01-22 DIAGNOSIS — N390 Urinary tract infection, site not specified: Secondary | ICD-10-CM | POA: Insufficient documentation

## 2012-01-22 DIAGNOSIS — R42 Dizziness and giddiness: Secondary | ICD-10-CM | POA: Insufficient documentation

## 2012-01-22 DIAGNOSIS — R21 Rash and other nonspecific skin eruption: Secondary | ICD-10-CM | POA: Insufficient documentation

## 2012-01-22 DIAGNOSIS — M7989 Other specified soft tissue disorders: Secondary | ICD-10-CM | POA: Insufficient documentation

## 2012-01-22 LAB — URINE MICROSCOPIC-ADD ON

## 2012-01-22 LAB — BASIC METABOLIC PANEL
BUN: 13 mg/dL (ref 6–23)
CO2: 25 mEq/L (ref 19–32)
Calcium: 9.3 mg/dL (ref 8.4–10.5)
Creatinine, Ser: 0.87 mg/dL (ref 0.50–1.10)
GFR calc non Af Amer: >90 mL/min (ref >90)
Glucose, Bld: 79 mg/dL (ref 70–99)
Sodium: 135 mEq/L (ref 135–145)

## 2012-01-22 LAB — URINALYSIS, ROUTINE W REFLEX MICROSCOPIC
Bilirubin Urine: NEGATIVE
Ketones, ur: NEGATIVE mg/dL
Nitrite: NEGATIVE
pH: 6 (ref 5.0–8.0)

## 2012-01-22 LAB — CBC
HCT: 32.7 % — ABNORMAL LOW (ref 36.0–46.0)
Hemoglobin: 10.7 g/dL — ABNORMAL LOW (ref 12.0–15.0)
MCH: 24.2 pg — ABNORMAL LOW (ref 26.0–34.0)
MCHC: 32.7 g/dL (ref 30.0–36.0)
MCV: 74 fL — ABNORMAL LOW (ref 78.0–100.0)
RBC: 4.42 MIL/uL (ref 3.87–5.11)

## 2012-01-22 MED ORDER — MECLIZINE HCL 12.5 MG PO TABS
25.0000 mg | ORAL_TABLET | Freq: Once | ORAL | Status: AC
  Administered 2012-01-22: 25 mg via ORAL
  Filled 2012-01-22: qty 2

## 2012-01-22 MED ORDER — MECLIZINE HCL 50 MG PO TABS: 25.0000 mg | ORAL_TABLET | Freq: Three times a day (TID) | ORAL | Status: AC | PRN

## 2012-01-22 MED ORDER — NITROFURANTOIN MONOHYD MACRO 100 MG PO CAPS: 100.0000 mg | ORAL_CAPSULE | Freq: Two times a day (BID) | ORAL | Status: AC

## 2012-01-22 NOTE — ED Notes (Addendum)
Pt c/o right lower leg swelling since 2009, had ultrasound done two weeks ago and it was negative per pt, pt also c/o dizziness for "a long time", has been seen at [facility] two weeks ago, had ct scan done a year ago, was given antibiotics, states that the dizziness became better and not it is worse again. Pt also c/o "bumps" to lower abd area that started two weeks ago, c/o itching.

## 2012-01-22 NOTE — Discharge Instructions (Signed)
Benign Positional Vertigo Vertigo means you feel like you or your surroundings are moving when they are not. Benign positional vertigo is the most common form of vertigo. Benign means that the cause of your condition is not serious. Benign positional vertigo is more common in older adults. CAUSES  Benign positional vertigo is the result of an upset in the labyrinth system. This is an area in the middle ear that helps control your balance. This may be caused by a viral infection, head injury, or repetitive motion. However, often no specific cause is found. SYMPTOMS  Symptoms of benign positional vertigo occur when you move your head or eyes in different directions. Some of the symptoms may include:  Loss of balance and falls.   Vomiting.   Blurred vision.   Dizziness.   Nausea.   Involuntary eye movements (nystagmus).  DIAGNOSIS  Benign positional vertigo is usually diagnosed by physical exam. If the specific cause of your benign positional vertigo is unknown, your caregiver may perform imaging tests, such as magnetic resonance imaging (MRI) or computed tomography (CT). TREATMENT  Your caregiver may recommend movements or procedures to correct the benign positional vertigo. Medicines such as meclizine, benzodiazepines, and medicines for nausea may be used to treat your symptoms. In rare cases, if your symptoms are caused by certain conditions that affect the inner ear, you may need surgery. HOME CARE INSTRUCTIONS   Follow your caregiver's instructions.   Move slowly. Do not make sudden body or head movements.   Avoid driving.   Avoid operating heavy machinery.   Avoid performing any tasks that would be dangerous to you or others during a vertigo episode.   Drink enough fluids to keep your urine clear or pale yellow.  SEEK IMMEDIATE MEDICAL CARE IF:   You develop problems with walking, weakness, numbness, or using your arms, hands, or legs.   You have difficulty speaking.   You  develop severe headaches.   Your nausea or vomiting continues or gets worse.   You develop visual changes.   Your family or friends notice any behavioral changes.   Your condition gets worse.   You have a fever.   You develop a stiff neck or sensitivity to light.  MAKE SURE YOU:   Understand these instructions.   Will watch your condition.   Will get help right away if you are not doing well or get worse.  Document Released: 05/16/2006 Document Revised: 07/28/2011 Document Reviewed: 04/28/2011 [facility] Patient Information 2012 [place], [place].Urinary Tract Infection Infections of the urinary tract can start in several places. A bladder infection (cystitis), a kidney infection (pyelonephritis), and a prostate infection (prostatitis) are different types of urinary tract infections (UTIs). They usually get better if treated with medicines (antibiotics) that kill germs. Take all the medicine until it is gone. You or your child may feel better in a few days, but TAKE ALL MEDICINE or the infection may not respond and may become more difficult to treat. HOME CARE INSTRUCTIONS   Drink enough water and fluids to keep the urine clear or pale yellow. Cranberry juice is especially recommended, in addition to large amounts of water.   Avoid caffeine, tea, and carbonated beverages. They tend to irritate the bladder.   Alcohol may irritate the prostate.   Only take over-the-counter or prescription medicines for pain, discomfort, or fever as directed by your caregiver.  To prevent further infections:  Empty the bladder often. Avoid holding urine for long periods of time.   After a  bowel movement, women should cleanse from front to back. Use each tissue only once.   Empty the bladder before and after sexual intercourse.  FINDING OUT THE RESULTS OF YOUR TEST Not all test results are available during your visit. If your or your child's test results are not back during the visit, make an  appointment with your caregiver to find out the results. Do not assume everything is normal if you have not heard from your caregiver or the medical facility. It is important for you to follow up on all test results. SEEK MEDICAL CARE IF:   There is back pain.   Your baby is older than 3 months with a rectal temperature of 100.5 F (38.1 C) or higher for more than 1 day.   Your or your child's problems (symptoms) are no better in 3 days. Return sooner if you or your child is getting worse.  SEEK IMMEDIATE MEDICAL CARE IF:   There is severe back pain or lower abdominal pain.   You or your child develops chills.   You have a fever.   Your baby is older than 3 months with a rectal temperature of 102 F (38.9 C) or higher.   Your baby is 84 months old or younger with a rectal temperature of 100.4 F (38 C) or higher.   There is nausea or vomiting.   There is continued burning or discomfort with urination.  MAKE SURE YOU:   Understand these instructions.   Will watch your condition.   Will get help right away if you are not doing well or get worse.  Document Released: 05/18/2005 Document Revised: 07/28/2011 Document Reviewed: 12/21/2006 [facility] Patient Information 2012 [place], [place].    Make sure you are drinking plenty of fluids to flush out the infection in your bladder, take the entire course of antibiotics.  You should get rechecked if he develops any worse symptoms including fevers, vomiting or pain that radiates into your back.  You may use the antivert as needed for your chronic intermittent dizziness.  Referred to the resource guide below for locating a primary medical doctor and get rechecked if your symptoms persist or worsen in any way.    RESOURCE GUIDE  Chronic Pain Problems: Contact [facility]  [phone] Patients need to be referred by their primary care doctor.  Insufficient Money for Medicine: Contact United Way:  call "211" or [facility] [phone].  No Primary Care Doctor: - Call Health Connect  [phone] - can help you locate a primary care doctor that  accepts your insurance, provides certain services, etc. - Physician Referral [phone]  Agencies that provide inexpensive medical care: - [facility]  [phone] - [facility]  [phone] - [facility]  [phone] [facility]  [phone] - [facility]  [phone] [facility]  [phone]  Medicaid-accepting [facility] Providers: - [facility]- [address]  [phone], Mon-Fri 9am-7pm, Sat 9am-1pm - [facility]- [address] [place], [address] [place]  [phone] - [facility]- [address] [place]  [phone] [facility]- [address]  [phone] - Renaye Rakers- [address] [place], [address], [phone]  Only accepts [place] patients after they have their name  applied to their card  Self Pay (no insurance) in [facility]: - Sickle Cell Patients: Dr Willey Blade, [place]  [facility]  [address] [place], [phone] - [facility]- [address] [place]  [phone]       [facility] [place]- [address]       -     [facility]- see information above (Speak to [employer] if you do not have insurance)       -  [facility]- [address] [place], [phone]       -  [facility] [place]- [address],  [phone]       -  [facility]- [address] [phone]       -  Dr Julio Sicks-  [address], [address], [place], [phone]       -  [facility]- [address] [phone]       -  [facility]- [address] [phone], also [address] [phone]       -    [facility]- [address] [place], [phone], 1st & 3rd Saturday   every month,  10am-1pm  1) Find a Doctor and Pay Out of Pocket Although you won't have to find out who is covered by your insurance plan, it is a good idea to ask around and get recommendations. You will then need to call the office and see if the doctor you have chosen will accept you as a new patient and what types of options they offer for patients who are self-pay. Some doctors offer discounts or will set up payment plans for their patients who do not have insurance, but you will need to ask so you aren't surprised when you get to your appointment.  2) Contact Your Local Health Department Not all health departments have doctors that can see patients for sick visits, but many do, so it is worth a call to see if yours does. If you don't know where your local health department is, you can check in your phone book. The CDC also has a tool to help you locate your state's health department, and many state websites also have listings of all of their local health departments.  3) Find a Walk-in Clinic If your illness is not likely to be very severe or complicated, you may want to try a walk in clinic. These are popping up all over the country in pharmacies, drugstores, and shopping centers. They're usually staffed by nurse practitioners or physician assistants that have been trained to treat common illnesses and complaints. They're usually fairly quick and inexpensive. However, if you have serious medical issues or chronic medical problems, these are probably not your best option  STD Testing - [facility] of [facility], STD Clinic, [address], [place], phone [phone] or [phone].  Monday - Friday, call for an appointment. [facility] of [employer], STD Clinic, [address], [place], phone [phone] or [phone].  Monday - Friday, call for an appointment.  Abuse/Neglect: [facility] Child Abuse Hotline [phone] [facility] Child Abuse Hotline 763-592-2240 (After Hours)  Emergency Shelter:  [facility] (828)[phone]  Maternity Homes: - [facility] [place] [facility] [phone] - [facility] 2561439609  MRSA Hotline #:   601-767-1331  [facility]  United Way [facility]  Dept. [address]                 [address]         [address] [place] [address]  Blondell Reveal Phone:  [phone]                                  Phone:  [phone]                   Phone:  [phone]  [facility], [phone] - [phone]       -     [facility] in [place], [address]                                  [phone], [facility] Child Abuse Hotline 314-509-8683 or 6361589590 (After Hours)   Behavioral Health Services  Substance Abuse Resources: - [facility] Drug [facility]  ([phone] - [facility] [phone] - [facility] [phone] Floydene Flock ([phone] - Residential & Outpatient Substance Abuse Program  [phone]  Psychological Services: [facility]  [phone] Services  [phone] - [facility] [place], [phone] [place]. [address], [place], ACCESS LINE: 330-245-0239 or [phone], [employer]  Dental Assistance  If unable to pay or uninsured, contact:  [facility] or [facility]. to become qualified for the adult dental clinic.  Patients with Medicaid: [facility] [phone] [address], [phone], [phone]  If unable to pay, or uninsured, contact [facility] [phone]) or [facility]  Department [phone] in [place], [phone] in [facility]) to become qualified for the adult dental clinic  Other Low-Cost Community Dental Services: - [facility]- [address] [place], [place], [place], [address], [phone], [address], 2nd and 4th Thursday of the month at 6:30am.  10 clients each day by appointment, can sometimes see walk-in patients if someone does not show for an appointment. [facility]- [address] [place], [place], [address], [phone] - [facility]- [address], [place], [place], [address], [phone] - [facility]- [phone] [facility] Health Department- [phone] [facility] Department- [phone]

## 2012-01-22 NOTE — ED Notes (Signed)
Pt presents with Rt leg pain and swelling. Pt states rt leg has been swelling when up and weight bearing since 2009. Pt reports having an [facility] 1 [facility] prior. Pt states US was negative for clots and was told to elevate her legs. NAD noted

## 2012-01-23 NOTE — ED Provider Notes (Signed)
History     CSN: [id number]  Arrival date & time 01/22/12  1132   First MD Initiated Contact with Patient 01/22/12 1235      Chief Complaint  Patient presents with  . Dizziness  . Leg Swelling    (Consider location/radiation/quality/duration/timing/severity/associated sxs/prior treatment) HPI Comments: Robin Perez presents with multiple complaints,  The first being intermittent episodes of dizziness,  Stating she can be sitting still,  And suddenly the room will spin,  Lasting a minute,  Then resolves.  She was seen for this last year at [facility],  States she had a normal head Ct scan and she was treated for vertigo.  She denies fevers,  Chills or headache.  She also denies any visual changes,  Numbness or focal weakness.  Her second complaint is for ongoing chronic bilateral lower extremity swelling that worsens when she is on her feet.  This has occurred for the past 4 years since she had her baby.  She was seen [facility] at [facility] for this 2 weeks ago and she states an ultrasound of her legs was normal.  She also has concern she may be pregnant - her implanon is about 6 months out of date - she is overdue for her period by 1 week and her last period was light.  She denies abdominal and pelvic pain,  No nausea,  Vomiting,  Denies shortness of breath and chest pain. She has not seen a primary doctor about any of her concerns.  The history is provided by the patient.    History reviewed. No pertinent past medical history.  Past Surgical History  Procedure Date  . Cesarean section   . Cesarean section     No family history on file.  History  Substance Use Topics  . Smoking status: Current Everyday Smoker -- 0.5 packs/day  . Smokeless tobacco: Not on file  . Alcohol Use: Yes     occ    OB History    Grav Para Term Preterm Abortions TAB SAB Ect Mult Living                  Review of Systems  Constitutional: Negative for fever and fatigue.  HENT:  Negative for congestion, sore throat and neck pain.   Eyes: Negative.   Respiratory: Negative for chest tightness, shortness of breath and wheezing.   Cardiovascular: Negative for chest pain.  Gastrointestinal: Positive for nausea. Negative for vomiting and abdominal pain.  Genitourinary: Negative.   Musculoskeletal: Negative for joint swelling and arthralgias.  Skin: Positive for rash. Negative for wound.  Neurological: Negative for dizziness, weakness, light-headedness, numbness and headaches.  Hematological: Negative.   Psychiatric/Behavioral: Negative.     Allergies  Review of patient's allergies indicates no known allergies.  Home Medications   Current Outpatient Rx  Name Route Sig Dispense Refill  . VITAMIN B-12 PO Oral Take 1 tablet by mouth daily.    . ETONOGESTREL 68 MG [place] IMPL Subcutaneous Inject into the skin once.      Marland Kitchen MECLIZINE HCL 50 MG PO TABS Oral Take 0.5 tablets (25 mg total) by mouth 3 (three) times daily as needed for dizziness. 30 tablet 0  . NITROFURANTOIN MONOHYD MACRO 100 MG PO CAPS Oral Take 1 capsule (100 mg total) by mouth 2 (two) times daily. 14 capsule 0    BP 120/72  Pulse 78  Temp 97.9 F (36.6 C)  Resp 20  Ht 5\' 7"  (1.702 m)  Wt 211 lb (  95.709 kg)  BMI 33.05 kg/m2  SpO2 98%  LMP 01/08/2012  Physical Exam  Nursing note and vitals reviewed. Constitutional: She appears well-developed and well-nourished.  HENT:  Head: Normocephalic and atraumatic.  Eyes: Conjunctivae are normal.  Neck: Normal range of motion.  Cardiovascular: Normal rate, regular rhythm, normal heart sounds and intact distal pulses.   Pulmonary/Chest: Effort normal and breath sounds normal. She has no wheezes. She has no rales.  Abdominal: Soft. Bowel sounds are normal. There is no tenderness. There is no rebound and no guarding.  Musculoskeletal: Normal range of motion. She exhibits no edema.       No peripheral edema,  Cords,  Or tenderness noted in lower extremities.      Neurological: She is alert. She has normal strength. No sensory deficit.  Reflex Scores:      Bicep reflexes are 2+ on the right side and 2+ on the left side.      Patellar reflexes are 2+ on the right side and 2+ on the left side.      Equal grip strength,  Cranial nerves 3-12 intact.  Negative pronator drift,  Negative nystagmus.  Skin: Skin is warm and dry.       Several scattered papules noted along suprapubic skin,  No drainage,  Surrounding erythema , induration or fluctuance.  Psychiatric: She has a normal mood and affect.    ED Course  Procedures (including critical care time)  Labs Reviewed  URINALYSIS, ROUTINE W REFLEX MICROSCOPIC - Abnormal; Notable for the following:    APPearance CLOUDY (*)    Hgb urine dipstick LARGE (*)    All other components within normal limits  CBC - Abnormal; Notable for the following:    WBC 11.0 (*)    Hemoglobin 10.7 (*)    HCT 32.7 (*)    MCV 74.0 (*)    MCH 24.2 (*)    All other components within normal limits  URINE MICROSCOPIC-ADD ON - Abnormal; Notable for the following:    Squamous Epithelial / LPF FEW (*)    Bacteria, UA MANY (*)    All other components within normal limits  BASIC METABOLIC PANEL  POCT PREGNANCY, URINE  URINE CULTURE   No results found.   1. UTI (lower urinary tract infection)   2. Vertigo       MDM  Labs reviewed.  Pt given meclizine in ed and she states it did help her dizziness.  Normal neuro exam with no exam findings suggestive of emergent neurologist condition.  Pt was prescribed meclizine.  Encouraged triple abx ointment on bikini line folliculitis.  Referrals given for pcp.        Burgess Amor, [place] 01/24/12 [phone]

## 2012-01-24 LAB — URINE CULTURE: Colony Count: 30000

## 2012-01-24 NOTE — ED Provider Notes (Signed)
Medical screening examination/treatment/procedure(s) were performed by non-physician practitioner and as supervising physician I was immediately available for consultation/collaboration.   Sondos Wolfman B. Bernette Mayers, MD 01/24/12 [phone]

## 2012-03-16 ENCOUNTER — Emergency Department (HOSPITAL_COMMUNITY)
Admission: EM | Admit: 2012-03-16 | Discharge: 2012-03-17 | Disposition: A | Discharge status: Home / Self Care | Payer: Medicaid Other | Attending: Emergency Medicine | Admitting: Emergency Medicine

## 2012-03-16 ENCOUNTER — Encounter (HOSPITAL_COMMUNITY): Payer: Self-pay | Admitting: Emergency Medicine

## 2012-03-16 ENCOUNTER — Emergency Department (HOSPITAL_COMMUNITY): Payer: Medicaid Other

## 2012-03-16 DIAGNOSIS — R42 Dizziness and giddiness: Secondary | ICD-10-CM | POA: Insufficient documentation

## 2012-03-16 DIAGNOSIS — F0781 Postconcussional syndrome: Secondary | ICD-10-CM

## 2012-03-16 DIAGNOSIS — R51 Headache: Secondary | ICD-10-CM | POA: Insufficient documentation

## 2012-03-16 DIAGNOSIS — Z87891 Personal history of nicotine dependence: Secondary | ICD-10-CM | POA: Insufficient documentation

## 2012-03-16 HISTORY — DX: Dizziness and giddiness: R42

## 2012-03-16 MED ORDER — LORAZEPAM 2 MG/ML IJ SOLN
1.0000 mg | Freq: Once | INTRAMUSCULAR | Status: DC
  Filled 2012-03-16: qty 1

## 2012-03-16 NOTE — ED Provider Notes (Signed)
History     CSN: [id number]  Arrival date & time 03/16/12  2031   First MD Initiated Contact with Patient 03/16/12 2111      9:52 PM HPI Patient reports that approximately 3 weeks ago she fell back off her bed and hit her head on the entertainment stands. Denies loss of consciousness. Reports since then has had a "fuzzy feeling in her head", increased vertiginous symptoms, nausea, and a headache. Reports she is already taking meclizine for vertigo But states this has not helped her symptoms. Denies neck pain, fever, numbness, tingling, weakness. Reports difficulty with speech and memory at times  Patient is a 26 y.o. female presenting with headaches. The history is provided by the patient.  Headache  This is a new problem. Episode onset: 3 weeks ago. The problem occurs constantly. The problem has not changed since onset.Associated with: a head injury. The pain is located in the right unilateral region. The quality of the pain is described as throbbing. The pain is moderate. The pain does not radiate. Associated symptoms include nausea. Pertinent negatives include no fever, no near-syncope, no orthopnea, no palpitations, no syncope, no shortness of breath and no vomiting. Treatments tried: meclizine. The treatment provided no relief.    Past Medical History  Diagnosis Date  . Vertigo     Past Surgical History  Procedure Date  . Cesarean section   . Cesarean section     No family history on file.  History  Substance Use Topics  . Smoking status: Former Smoker -- 0.5 packs/day  . Smokeless tobacco: Not on file  . Alcohol Use: Yes     occ    OB History    Grav Para Term Preterm Abortions TAB SAB Ect Mult Living                  Review of Systems  Constitutional: Negative for fever and chills.  HENT: Negative for congestion, sore throat, rhinorrhea, trouble swallowing, neck pain, neck stiffness, postnasal drip and sinus pressure.   Respiratory: Negative for cough and  shortness of breath.   Cardiovascular: Negative for palpitations, orthopnea, syncope and near-syncope.  Gastrointestinal: Positive for nausea. Negative for vomiting.  Musculoskeletal: Negative for back pain.  Neurological: Positive for dizziness, light-headedness and headaches. Negative for seizures, speech difficulty, weakness and numbness.  All other systems reviewed and are negative.    Allergies  Review of patient's allergies indicates no known allergies.  Home Medications   Current Outpatient Rx  Name Route Sig Dispense Refill  . ETONOGESTREL 68 MG [place] IMPL Subcutaneous Inject into the skin once.      Marland Kitchen MECLIZINE HCL 25 MG PO TABS Oral Take 25 mg by mouth 3 (three) times daily as needed. For dizziness      BP 124/73  Pulse 61  Temp 98.5 F (36.9 C) (Oral)  Resp 18  SpO2 99%  LMP 03/02/2012  Physical Exam  Vitals reviewed. Constitutional: She is oriented to person, place, and time. Vital signs are normal. She appears well-developed and well-nourished.  HENT:  Head: Normocephalic and atraumatic.  Right Ear: External ear normal. No hemotympanum.  Left Ear: External ear normal. No hemotympanum.  Nose: Nose normal.  Mouth/Throat: Uvula is midline, oropharynx is clear and moist and mucous membranes are normal.  Eyes: Conjunctivae are normal. Pupils are equal, round, and reactive to light.  Neck: Normal range of motion. Neck supple. No spinous process tenderness and no muscular tenderness present. No rigidity. Normal range of motion  present.  Cardiovascular: Normal rate, regular rhythm and normal heart sounds.  Exam reveals no friction rub.   No murmur heard. Pulmonary/Chest: Effort normal and breath sounds normal. She has no wheezes. She has no rhonchi. She has no rales. She exhibits no tenderness.  Musculoskeletal: Normal range of motion.  Neurological: She is alert and oriented to person, place, and time. She has normal strength. No cranial nerve deficit (tested CN III  through XII) or sensory deficit (Normal facial sensation). She exhibits normal muscle tone (Normal hand grip). Coordination and gait normal.  Skin: Skin is warm and dry. No rash noted. No erythema. No pallor.    ED Course  Procedures  Ct Head Wo Contrast  03/16/2012  *RADIOLOGY REPORT*  Clinical Data: Lightheaded, nausea, recent fall.  CT HEAD WITHOUT CONTRAST  Technique:  Contiguous axial images were obtained from the base of the skull through the vertex without contrast.  Comparison: 10/10/2010  Findings: There is no evidence for acute hemorrhage, hydrocephalus, mass lesion, or abnormal extra-axial fluid collection.  No definite CT evidence for acute infarction.  The visualized paranasal sinuses and mastoid air cells are predominately clear.  No displaced calvarial fracture.  IMPRESSION: No acute intracranial abnormality.  Original Report Authenticated By: Waneta Martins, M.D.     MDM  Suspect postconcussive syndrome however with the recent history of head injury and worsening symptoms will obtain CT to rule out hemorrhage. Low suspicion for this since it has been 3 weeks since initial injury.  12:23 AM Post concussive syndrome likely. CT negative. Reports while waiting symptoms have improved. Will d/c with Ativan for dizziness, naproxen for pain and Neuro referral. Pt voices understanding and is ready for d/c       Thomasene Lot, PA-C 03/17/12 0055

## 2012-03-16 NOTE — ED Notes (Signed)
Pt states she fell off bed 3 weeks ago and hit the back of her head on entertainment center.  Reports head feeling fuzzy, lightheadedness, and nausea.  Takes Meclizine for vertigo.

## 2012-03-17 MED ORDER — LORAZEPAM 1 MG PO TABS: 1.0000 mg | ORAL_TABLET | Freq: Three times a day (TID) | ORAL | Status: AC | PRN

## 2012-03-17 MED ORDER — NAPROXEN 500 MG PO TABS: 500.0000 mg | ORAL_TABLET | Freq: Two times a day (BID) | ORAL | Status: DC

## 2012-03-18 NOTE — ED Provider Notes (Signed)
Medical screening examination/treatment/procedure(s) were performed by non-physician practitioner and as supervising physician I was immediately available for consultation/collaboration.    Nelia Shi, MD 03/18/12 [phone]

## 2012-10-18 LAB — OB RESULTS CONSOLE ANTIBODY SCREEN: Antibody Screen: NEGATIVE

## 2012-10-18 LAB — OB RESULTS CONSOLE GC/CHLAMYDIA
Chlamydia: NEGATIVE
Gonorrhea: NEGATIVE

## 2012-10-18 LAB — OB RESULTS CONSOLE ABO/RH: RH Type: POSITIVE

## 2012-10-18 LAB — OB RESULTS CONSOLE VARICELLA ZOSTER ANTIBODY, IGG: Varicella: IMMUNE

## 2012-10-24 ENCOUNTER — Encounter: Payer: Self-pay | Admitting: *Deleted

## 2012-10-24 DIAGNOSIS — J45909 Unspecified asthma, uncomplicated: Secondary | ICD-10-CM | POA: Insufficient documentation

## 2012-11-14 ENCOUNTER — Encounter: Payer: Self-pay | Admitting: Advanced Practice Midwife

## 2012-11-20 ENCOUNTER — Encounter: Payer: Self-pay | Admitting: Advanced Practice Midwife

## 2012-11-20 ENCOUNTER — Ambulatory Visit (INDEPENDENT_AMBULATORY_CARE_PROVIDER_SITE_OTHER): Payer: Medicaid Other | Admitting: Advanced Practice Midwife

## 2012-11-20 VITALS — BP 128/70 | Wt 253.0 lb

## 2012-11-20 DIAGNOSIS — Z331 Pregnant state, incidental: Secondary | ICD-10-CM

## 2012-11-20 DIAGNOSIS — O34219 Maternal care for unspecified type scar from previous cesarean delivery: Secondary | ICD-10-CM

## 2012-11-20 DIAGNOSIS — Z1389 Encounter for screening for other disorder: Secondary | ICD-10-CM

## 2012-11-20 DIAGNOSIS — Z3482 Encounter for supervision of other normal pregnancy, second trimester: Secondary | ICD-10-CM

## 2012-11-20 DIAGNOSIS — Z3480 Encounter for supervision of other normal pregnancy, unspecified trimester: Secondary | ICD-10-CM | POA: Insufficient documentation

## 2012-11-20 DIAGNOSIS — Z3481 Encounter for supervision of other normal pregnancy, first trimester: Secondary | ICD-10-CM

## 2012-11-20 LAB — POCT URINALYSIS DIPSTICK: Protein, UA: 1

## 2012-11-20 NOTE — Progress Notes (Signed)
'  Soreness' in pelvis, sounds like RLP- relief measures discussed.  Denies uti s/s. Routine questions about pregnancy answered.  F/U in 2 weeks for anatomy scan and routine pnv.

## 2012-11-22 LAB — URINE CULTURE: Colony Count: 50000

## 2012-12-04 ENCOUNTER — Ambulatory Visit (INDEPENDENT_AMBULATORY_CARE_PROVIDER_SITE_OTHER): Payer: Medicaid Other

## 2012-12-04 ENCOUNTER — Other Ambulatory Visit: Payer: Self-pay | Admitting: Advanced Practice Midwife

## 2012-12-04 ENCOUNTER — Encounter: Payer: Self-pay | Admitting: Obstetrics & Gynecology

## 2012-12-04 ENCOUNTER — Ambulatory Visit (INDEPENDENT_AMBULATORY_CARE_PROVIDER_SITE_OTHER): Payer: Medicaid Other | Admitting: Obstetrics & Gynecology

## 2012-12-04 VITALS — BP 110/60 | Wt 256.0 lb

## 2012-12-04 DIAGNOSIS — O99320 Drug use complicating pregnancy, unspecified trimester: Secondary | ICD-10-CM

## 2012-12-04 DIAGNOSIS — O34219 Maternal care for unspecified type scar from previous cesarean delivery: Secondary | ICD-10-CM

## 2012-12-04 DIAGNOSIS — Z3482 Encounter for supervision of other normal pregnancy, second trimester: Secondary | ICD-10-CM

## 2012-12-04 DIAGNOSIS — Z1389 Encounter for screening for other disorder: Secondary | ICD-10-CM

## 2012-12-04 DIAGNOSIS — Z363 Encounter for antenatal screening for malformations: Secondary | ICD-10-CM

## 2012-12-04 DIAGNOSIS — F1920 Other psychoactive substance dependence, uncomplicated: Secondary | ICD-10-CM

## 2012-12-04 DIAGNOSIS — J45909 Unspecified asthma, uncomplicated: Secondary | ICD-10-CM

## 2012-12-04 DIAGNOSIS — Z331 Pregnant state, incidental: Secondary | ICD-10-CM

## 2012-12-04 LAB — US OB DETAIL + 14 WK

## 2012-12-04 LAB — POCT URINALYSIS DIPSTICK
Blood, UA: 4
Ketones, UA: NEGATIVE
Protein, UA: 1

## 2012-12-04 NOTE — Progress Notes (Signed)
BP weight and urine results all reviewed and noted. Patient reports good fetal movement, denies any bleeding and no rupture of membranes symptoms or regular contractions. Patient is without complaints. All questions were answered. Sonogram normal reviewed in full with patient

## 2012-12-04 NOTE — Progress Notes (Signed)
U/S (20+1wks)-single active fetus, meas c/w dates, ant gr 0 plac, no major abnl noted, fluid wnl, cx long and closed, bilateral adnexa wnl, female fetus

## 2012-12-04 NOTE — Patient Instructions (Signed)
Pregnancy - Second Trimester The second trimester of pregnancy (3 to 6 months) is a period of rapid growth for you and your baby. At the end of the sixth month, your baby is about 9 inches long and weighs 1 1/2 pounds. You will begin to feel the baby move between 18 and 20 weeks of the pregnancy. This is called quickening. Weight gain is faster. A clear fluid (colostrum) may leak out of your breasts. You may feel small contractions of the womb (uterus). This is known as false labor or Braxton-Hicks contractions. This is like a practice for labor when the baby is ready to be born. Usually, the problems with morning sickness have usually passed by the end of your first trimester. Some women develop small dark blotches (called cholasma, mask of pregnancy) on their face that usually goes away after the baby is born. Exposure to the sun makes the blotches worse. Acne may also develop in some pregnant women and pregnant women who have acne, may find that it goes away. PRENATAL EXAMS  Blood work may continue to be done during prenatal exams. These tests are done to check on your health and the probable health of your baby. Blood work is used to follow your blood levels (hemoglobin). Anemia (low hemoglobin) is common during pregnancy. Iron and vitamins are given to help prevent this. You will also be checked for diabetes between 24 and 28 weeks of the pregnancy. Some of the previous blood tests may be repeated.  The size of the uterus is measured during each visit. This is to make sure that the baby is continuing to grow properly according to the dates of the pregnancy.  Your blood pressure is checked every prenatal visit. This is to make sure you are not getting toxemia.  Your urine is checked to make sure you do not have an infection, diabetes or protein in the urine.  Your weight is checked often to make sure gains are happening at the suggested rate. This is to ensure that both you and your baby are growing  normally.  Sometimes, an ultrasound is performed to confirm the proper growth and development of the baby. This is a test which bounces harmless sound waves off the baby so your caregiver can more accurately determine due dates. Sometimes, a specialized test is done on the amniotic fluid surrounding the baby. This test is called an amniocentesis. The amniotic fluid is obtained by sticking a needle into the belly (abdomen). This is done to check the chromosomes in instances where there is a concern about possible genetic problems with the baby. It is also sometimes done near the end of pregnancy if an early delivery is required. In this case, it is done to help make sure the baby's lungs are mature enough for the baby to live outside of the womb. CHANGES OCCURING IN THE SECOND TRIMESTER OF PREGNANCY Your body goes through many changes during pregnancy. They vary from person to person. Talk to your caregiver about changes you notice that you are concerned about.  During the second trimester, you will likely have an increase in your appetite. It is normal to have cravings for certain foods. This varies from person to person and pregnancy to pregnancy.  Your lower abdomen will begin to bulge.  You may have to urinate more often because the uterus and baby are pressing on your bladder. It is also common to get more bladder infections during pregnancy (pain with urination). You can help this by   drinking lots of fluids and emptying your bladder before and after intercourse.  You may begin to get stretch marks on your hips, abdomen, and breasts. These are normal changes in the body during pregnancy. There are no exercises or medications to take that prevent this change.  You may begin to develop swollen and bulging veins (varicose veins) in your legs. Wearing support hose, elevating your feet for 15 minutes, 3 to 4 times a day and limiting salt in your diet helps lessen the problem.  Heartburn may develop  as the uterus grows and pushes up against the stomach. Antacids recommended by your caregiver helps with this problem. Also, eating smaller meals 4 to 5 times a day helps.  Constipation can be treated with a stool softener or adding bulk to your diet. Drinking lots of fluids, vegetables, fruits, and whole grains are helpful.  Exercising is also helpful. If you have been very active up until your pregnancy, most of these activities can be continued during your pregnancy. If you have been less active, it is helpful to start an exercise program such as walking.  Hemorrhoids (varicose veins in the rectum) may develop at the end of the second trimester. Warm sitz baths and hemorrhoid cream recommended by your caregiver helps hemorrhoid problems.  Backaches may develop during this time of your pregnancy. Avoid heavy lifting, wear low heal shoes and practice good posture to help with backache problems.  Some pregnant women develop tingling and numbness of their hand and fingers because of swelling and tightening of ligaments in the wrist (carpel tunnel syndrome). This goes away after the baby is born.  As your breasts enlarge, you may have to get a bigger bra. Get a comfortable, cotton, support bra. Do not get a nursing bra until the last month of the pregnancy if you will be nursing the baby.  You may get a dark line from your belly button to the pubic area called the linea nigra.  You may develop rosy cheeks because of increase blood flow to the face.  You may develop spider looking lines of the face, neck, arms and chest. These go away after the baby is born. HOME CARE INSTRUCTIONS   It is extremely important to avoid all smoking, herbs, alcohol, and unprescribed drugs during your pregnancy. These chemicals affect the formation and growth of the baby. Avoid these chemicals throughout the pregnancy to ensure the delivery of a healthy infant.  Most of your home care instructions are the same as  suggested for the first trimester of your pregnancy. Keep your caregiver's appointments. Follow your caregiver's instructions regarding medication use, exercise and diet.  During pregnancy, you are providing food for you and your baby. Continue to eat regular, well-balanced meals. Choose foods such as meat, fish, milk and other low fat dairy products, vegetables, fruits, and whole-grain breads and cereals. Your caregiver will tell you of the ideal weight gain.  A physical sexual relationship may be continued up until near the end of pregnancy if there are no other problems. Problems could include early (premature) leaking of amniotic fluid from the membranes, vaginal bleeding, abdominal pain, or other medical or pregnancy problems.  Exercise regularly if there are no restrictions. Check with your caregiver if you are unsure of the safety of some of your exercises. The greatest weight gain will occur in the last 2 trimesters of pregnancy. Exercise will help you:  Control your weight.  Get you in shape for labor and delivery.  Lose weight   after you have the baby.  Wear a good support or jogging bra for breast tenderness during pregnancy. This may help if worn during sleep. Pads or tissues may be used in the bra if you are leaking colostrum.  Do not use hot tubs, steam rooms or saunas throughout the pregnancy.  Wear your seat belt at all times when driving. This protects you and your baby if you are in an accident.  Avoid raw meat, uncooked cheese, cat litter boxes and soil used by cats. These carry germs that can cause birth defects in the baby.  The second trimester is also a good time to visit your dentist for your dental health if this has not been done yet. Getting your teeth cleaned is OK. Use a soft toothbrush. Brush gently during pregnancy.  It is easier to loose urine during pregnancy. Tightening up and strengthening the pelvic muscles will help with this problem. Practice stopping your  urination while you are going to the bathroom. These are the same muscles you need to strengthen. It is also the muscles you would use as if you were trying to stop from passing gas. You can practice tightening these muscles up 10 times a set and repeating this about 3 times per day. Once you know what muscles to tighten up, do not perform these exercises during urination. It is more likely to contribute to an infection by backing up the urine.  Ask for help if you have financial, counseling or nutritional needs during pregnancy. Your caregiver will be able to offer counseling for these needs as well as refer you for other special needs.  Your skin may become oily. If so, wash your face with mild soap, use non-greasy moisturizer and oil or cream based makeup. MEDICATIONS AND DRUG USE IN PREGNANCY  Take prenatal vitamins as directed. The vitamin should contain 1 milligram of folic acid. Keep all vitamins out of reach of children. Only a couple vitamins or tablets containing iron may be fatal to a baby or young child when ingested.  Avoid use of all medications, including herbs, over-the-counter medications, not prescribed or suggested by your caregiver. Only take over-the-counter or prescription medicines for pain, discomfort, or fever as directed by your caregiver. Do not use aspirin.  Let your caregiver also know about herbs you may be using.  Alcohol is related to a number of birth defects. This includes fetal alcohol syndrome. All alcohol, in any form, should be avoided completely. Smoking will cause low birth rate and premature babies.  Street or illegal drugs are very harmful to the baby. They are absolutely forbidden. A baby born to an addicted mother will be addicted at birth. The baby will go through the same withdrawal an adult does. SEEK MEDICAL CARE IF:  You have any concerns or worries during your pregnancy. It is better to call with your questions if you feel they cannot wait, rather  than worry about them. SEEK IMMEDIATE MEDICAL CARE IF:   An unexplained oral temperature above 102 F (38.9 C) develops, or as your caregiver suggests.  You have leaking of fluid from the vagina (birth canal). If leaking membranes are suspected, take your temperature and tell your caregiver of this when you call.  There is vaginal spotting, bleeding, or passing clots. Tell your caregiver of the amount and how many pads are used. Light spotting in pregnancy is common, especially following intercourse.  You develop a bad smelling vaginal discharge with a change in the color from clear   to white.  You continue to feel sick to your stomach (nauseated) and have no relief from remedies suggested. You vomit blood or coffee ground-like materials.  You lose more than 2 pounds of weight or gain more than 2 pounds of weight over 1 week, or as suggested by your caregiver.  You notice swelling of your face, hands, feet, or legs.  You get exposed to German measles and have never had them.  You are exposed to fifth disease or chickenpox.  You develop belly (abdominal) pain. Round ligament discomfort is a common non-cancerous (benign) cause of abdominal pain in pregnancy. Your caregiver still must evaluate you.  You develop a bad headache that does not go away.  You develop fever, diarrhea, pain with urination, or shortness of breath.  You develop visual problems, blurry, or double vision.  You fall or are in a car accident or any kind of trauma.  There is mental or physical violence at home. Document Released: 08/02/2001 Document Revised: 10/31/2011 Document Reviewed: 02/04/2009 [employer] Patient Information 2013 [employer].  

## 2013-01-01 ENCOUNTER — Encounter: Payer: Medicaid Other | Admitting: Women's Health

## 2013-01-04 ENCOUNTER — Encounter: Payer: Medicaid Other | Admitting: Obstetrics & Gynecology

## 2013-01-15 ENCOUNTER — Encounter: Payer: Medicaid Other | Admitting: Obstetrics & Gynecology

## 2013-01-17 ENCOUNTER — Encounter: Payer: Medicaid Other | Admitting: Obstetrics & Gynecology

## 2013-01-22 ENCOUNTER — Encounter: Payer: Self-pay | Admitting: Women's Health

## 2013-01-22 ENCOUNTER — Ambulatory Visit (INDEPENDENT_AMBULATORY_CARE_PROVIDER_SITE_OTHER): Payer: Medicaid Other | Admitting: Women's Health

## 2013-01-22 VITALS — BP 120/58 | Wt 257.0 lb

## 2013-01-22 DIAGNOSIS — Z331 Pregnant state, incidental: Secondary | ICD-10-CM

## 2013-01-22 DIAGNOSIS — Z3482 Encounter for supervision of other normal pregnancy, second trimester: Secondary | ICD-10-CM

## 2013-01-22 DIAGNOSIS — Z1389 Encounter for screening for other disorder: Secondary | ICD-10-CM

## 2013-01-22 DIAGNOSIS — O34219 Maternal care for unspecified type scar from previous cesarean delivery: Secondary | ICD-10-CM

## 2013-01-22 NOTE — Progress Notes (Signed)
Unable to void. Reports good fm. Denies uc's, lof, vb, urinary frequency, urgency, hesitancy, or dysuria.  Has occasional cramping- recommended increasing po intake, resting. Has missed 2 appts d/t transportation issues- recommneded RCATs if needed. Reviewed ptl s/s and fetal kick counts.  All questions answered. F/U in 1wk for PN2.

## 2013-01-22 NOTE — Patient Instructions (Addendum)
You will have your sugar test next visit.  Please do not eat or drink anything after midnight the night before you come, not even water.  You will be here for at least two hours.    Pregnancy - Third Trimester The third trimester of pregnancy (the last 3 months) is a period of the most rapid growth for you and your baby. The baby approaches a length of 20 inches and a weight of 6 to 10 pounds. The baby is adding on fat and getting ready for life outside your body. While inside, babies have periods of sleeping and waking, sucking thumbs, and hiccuping. You can often feel small contractions of the uterus. This is false labor. It is also called Braxton-Hicks contractions. This is like a practice for labor. The usual problems in this stage of pregnancy include more difficulty breathing, swelling of the hands and feet from water retention, and having to urinate more often because of the uterus and baby pressing on your bladder.  PRENATAL EXAMS  Blood work may continue to be done during prenatal exams. These tests are done to check on your health and the probable health of your baby. Blood work is used to follow your blood levels (hemoglobin). Anemia (low hemoglobin) is common during pregnancy. Iron and vitamins are given to help prevent this. You may also continue to be checked for diabetes. Some of the past blood tests may be done again.  The size of the uterus is measured during each visit. This makes sure your baby is growing properly according to your pregnancy dates.  Your blood pressure is checked every prenatal visit. This is to make sure you are not getting toxemia.  Your urine is checked every prenatal visit for infection, diabetes, and protein.  Your weight is checked at each visit. This is done to make sure gains are happening at the suggested rate and that you and your baby are growing normally.  Sometimes, an ultrasound is performed to confirm the position and the proper growth and  development of the baby. This is a test done that bounces harmless sound waves off the baby so your caregiver can more accurately determine a due date.  Discuss the type of pain medicine and anesthesia you will have during your labor and delivery.  Discuss the possibility and anesthesia if a cesarean section might be necessary.  Inform your caregiver if there is any mental or physical violence at home. Sometimes, a specialized non-stress test, contraction stress test, and biophysical profile are done to make sure the baby is not having a problem. Checking the amniotic fluid surrounding the baby is called an amniocentesis. The amniotic fluid is removed by sticking a needle into the belly (abdomen). This is sometimes done near the end of pregnancy if an early delivery is required. In this case, it is done to help make sure the baby's lungs are mature enough for the baby to live outside of the womb. If the lungs are not mature and it is unsafe to deliver the baby, an injection of cortisone medicine is given to the mother 1 to 2 days before the delivery. This helps the baby's lungs mature and makes it safer to deliver the baby. CHANGES OCCURING IN THE THIRD TRIMESTER OF PREGNANCY Your body goes through many changes during pregnancy. They vary from person to person. Talk to your caregiver about changes you notice and are concerned about.  During the last trimester, you have probably had an increase in your appetite. It   is normal to have cravings for certain foods. This varies from person to person and pregnancy to pregnancy.  You may begin to get stretch marks on your hips, abdomen, and breasts. These are normal changes in the body during pregnancy. There are no exercises or medicines to take which prevent this change.  Constipation may be treated with a stool softener or adding bulk to your diet. Drinking lots of fluids, fiber in vegetables, fruits, and whole grains are helpful.  Exercising is also  helpful. If you have been very active up until your pregnancy, most of these activities can be continued during your pregnancy. If you have been less active, it is helpful to start an exercise program such as walking. Consult your caregiver before starting exercise programs.  Avoid all smoking, alcohol, non-prescribed drugs, herbs and "street drugs" during your pregnancy. These chemicals affect the formation and growth of the baby. Avoid chemicals throughout the pregnancy to ensure the delivery of a healthy infant.  Backache, varicose veins, and hemorrhoids may develop or get worse.  You will tire more easily in the third trimester, which is normal.  The baby's movements may be stronger and more often.  You may become short of breath easily.  Your belly button may stick out.  A yellow discharge may leak from your breasts called colostrum.  You may have a bloody mucus discharge. This usually occurs a few days to a week before labor begins. HOME CARE INSTRUCTIONS   Keep your caregiver's appointments. Follow your caregiver's instructions regarding medicine use, exercise, and diet.  During pregnancy, you are providing food for you and your baby. Continue to eat regular, well-balanced meals. Choose foods such as meat, fish, milk and other low fat dairy products, vegetables, fruits, and whole-grain breads and cereals. Your caregiver will tell you of the ideal weight gain.  A physical sexual relationship may be continued throughout pregnancy if there are no other problems such as early (premature) leaking of amniotic fluid from the membranes, vaginal bleeding, or belly (abdominal) pain.  Exercise regularly if there are no restrictions. Check with your caregiver if you are unsure of the safety of your exercises. Greater weight gain will occur in the last 2 trimesters of pregnancy. Exercising helps:  Control your weight.  Get you in shape for labor and delivery.  You lose weight after you  deliver.  Rest a lot with legs elevated, or as needed for leg cramps or low back pain.  Wear a good support or jogging bra for breast tenderness during pregnancy. This may help if worn during sleep. Pads or tissues may be used in the bra if you are leaking colostrum.  Do not use hot tubs, steam rooms, or saunas.  Wear your seat belt when driving. This protects you and your baby if you are in an accident.  Avoid raw meat, cat litter boxes and soil used by cats. These carry germs that can cause birth defects in the baby.  It is easier to leak urine during pregnancy. Tightening up and strengthening the pelvic muscles will help with this problem. You can practice stopping your urination while you are going to the bathroom. These are the same muscles you need to strengthen. It is also the muscles you would use if you were trying to stop from passing gas. You can practice tightening these muscles up 10 times a set and repeating this about 3 times per day. Once you know what muscles to tighten up, do not perform these exercises   during urination. It is more likely to cause an infection by backing up the urine.  Ask for help if you have financial, counseling, or nutritional needs during pregnancy. Your caregiver will be able to offer counseling for these needs as well as refer you for other special needs.  Make a list of emergency phone numbers and have them available.  Plan on getting help from family or friends when you go home from the hospital.  Make a trial run to the hospital.  Take prenatal classes with the father to understand, practice, and ask questions about the labor and delivery.  Prepare the baby's room or nursery.  Do not travel out of the city unless it is absolutely necessary and with the advice of your caregiver.  Wear only low or no heal shoes to have better balance and prevent falling. MEDICINES AND DRUG USE IN PREGNANCY  Take prenatal vitamins as directed. The vitamin  should contain 1 milligram of folic acid. Keep all vitamins out of reach of children. Only a couple vitamins or tablets containing iron may be fatal to a baby or young child when ingested.  Avoid use of all medicines, including herbs, over-the-counter medicines, not prescribed or suggested by your caregiver. Only take over-the-counter or prescription medicines for pain, discomfort, or fever as directed by your caregiver. Do not use aspirin, ibuprofen or naproxen unless approved by your caregiver.  Let your caregiver also know about herbs you may be using.  Alcohol is related to a number of birth defects. This includes fetal alcohol syndrome. All alcohol, in any form, should be avoided completely. Smoking will cause low birth rate and premature babies.  Illegal drugs are very harmful to the baby. They are absolutely forbidden. A baby born to an addicted mother will be addicted at birth. The baby will go through the same withdrawal an adult does. SEEK MEDICAL CARE IF: You have any concerns or worries during your pregnancy. It is better to call with your questions if you feel they cannot wait, rather than worry about them. SEEK IMMEDIATE MEDICAL CARE IF:   An unexplained oral temperature above 102 F (38.9 C) develops, or as your caregiver suggests.  You have leaking of fluid from the vagina. If leaking membranes are suspected, take your temperature and tell your caregiver of this when you call.  There is vaginal spotting, bleeding or passing clots. Tell your caregiver of the amount and how many pads are used.  You develop a bad smelling vaginal discharge with a change in the color from clear to white.  You develop vomiting that lasts more than 24 hours.  You develop chills or fever.  You develop shortness of breath.  You develop burning on urination.  You loose more than 2 pounds of weight or gain more than 2 pounds of weight or as suggested by your caregiver.  You notice sudden  swelling of your face, hands, and feet or legs.  You develop belly (abdominal) pain. Round ligament discomfort is a common non-cancerous (benign) cause of abdominal pain in pregnancy. Your caregiver still must evaluate you.  You develop a severe headache that does not go away.  You develop visual problems, blurred or double vision.  If you have not felt your baby move for more than 1 hour. If you think the baby is not moving as much as usual, eat something with sugar in it and lie down on your left side for an hour. The baby should move at least 4 to   5 times per hour. Call right away if your baby moves less than that.  You fall, are in a car accident, or any kind of trauma.  There is mental or physical violence at home. Document Released: 08/02/2001 Document Revised: 05/02/2012 Document Reviewed: 02/04/2009 [employer] Patient Information 2014 [employer].  

## 2013-01-22 NOTE — Progress Notes (Signed)
Pain and pressure in bottom of stomach and into right side.

## 2013-01-30 ENCOUNTER — Other Ambulatory Visit: Payer: Medicaid Other

## 2013-01-30 ENCOUNTER — Encounter: Payer: Medicaid Other | Admitting: Obstetrics and Gynecology

## 2013-02-28 ENCOUNTER — Encounter: Payer: Medicaid Other | Admitting: Obstetrics and Gynecology

## 2013-03-01 ENCOUNTER — Ambulatory Visit (INDEPENDENT_AMBULATORY_CARE_PROVIDER_SITE_OTHER): Payer: Medicaid Other | Admitting: Obstetrics and Gynecology

## 2013-03-01 ENCOUNTER — Encounter: Payer: Self-pay | Admitting: Obstetrics and Gynecology

## 2013-03-01 VITALS — BP 128/80 | Wt 253.8 lb

## 2013-03-01 DIAGNOSIS — O34219 Maternal care for unspecified type scar from previous cesarean delivery: Secondary | ICD-10-CM

## 2013-03-01 NOTE — Patient Instructions (Signed)
Please review [place] consent form and bring back with you.

## 2013-03-01 NOTE — Progress Notes (Signed)
Will schedule 2 hr gtt asap, and get pt back in for discussion of pro/con for tolac.  Will request records.   May need to consider recomending repeat c/s. Pt "already got my mind made up" wants a tolac. Will request records from [place].

## 2013-03-01 NOTE — Progress Notes (Signed)
Pt here today for routine visit. Pt denies any problems or issues at this time. Pt was unable to void. Pt has not done sugar test yet!!!

## 2013-03-05 ENCOUNTER — Other Ambulatory Visit: Payer: Medicaid Other

## 2013-03-05 DIAGNOSIS — Z3480 Encounter for supervision of other normal pregnancy, unspecified trimester: Secondary | ICD-10-CM

## 2013-03-06 LAB — CBC
HCT: 28.2 % — ABNORMAL LOW (ref 36.0–46.0)
MCH: 24.1 pg — ABNORMAL LOW (ref 26.0–34.0)
MCHC: 34 g/dL (ref 30.0–36.0)
MCV: 70.9 fL — ABNORMAL LOW (ref 78.0–100.0)
Platelets: 218 10*3/uL (ref 150–400)
RDW: 15 % (ref 11.5–15.5)
WBC: 11.3 10*3/uL — ABNORMAL HIGH (ref 4.0–10.5)

## 2013-03-06 LAB — ANTIBODY SCREEN: Antibody Screen: NEGATIVE

## 2013-03-06 LAB — HSV 2 ANTIBODY, IGG: HSV 2 Glycoprotein G Ab, IgG: <0.1 IV

## 2013-03-15 ENCOUNTER — Encounter: Payer: Self-pay | Admitting: Obstetrics and Gynecology

## 2013-03-15 ENCOUNTER — Ambulatory Visit (INDEPENDENT_AMBULATORY_CARE_PROVIDER_SITE_OTHER): Payer: Medicaid Other | Admitting: Obstetrics and Gynecology

## 2013-03-15 ENCOUNTER — Other Ambulatory Visit: Payer: Self-pay | Admitting: Obstetrics and Gynecology

## 2013-03-15 ENCOUNTER — Ambulatory Visit (INDEPENDENT_AMBULATORY_CARE_PROVIDER_SITE_OTHER): Payer: Medicaid Other

## 2013-03-15 VITALS — BP 120/82 | Wt 253.4 lb

## 2013-03-15 DIAGNOSIS — O34219 Maternal care for unspecified type scar from previous cesarean delivery: Secondary | ICD-10-CM

## 2013-03-15 DIAGNOSIS — Z1389 Encounter for screening for other disorder: Secondary | ICD-10-CM

## 2013-03-15 DIAGNOSIS — O0993 Supervision of high risk pregnancy, unspecified, third trimester: Secondary | ICD-10-CM

## 2013-03-15 DIAGNOSIS — O26849 Uterine size-date discrepancy, unspecified trimester: Secondary | ICD-10-CM

## 2013-03-15 DIAGNOSIS — Z3009 Encounter for other general counseling and advice on contraception: Secondary | ICD-10-CM | POA: Insufficient documentation

## 2013-03-15 DIAGNOSIS — Z331 Pregnant state, incidental: Secondary | ICD-10-CM

## 2013-03-15 LAB — POCT URINALYSIS DIPSTICK
Glucose, UA: NEGATIVE
Leukocytes, UA: NEGATIVE
Nitrite, UA: NEGATIVE

## 2013-03-15 NOTE — Patient Instructions (Addendum)
TOLAC Consult Note     [facility]  27 y.o. [id number] at [redacted]w[redacted]d with Estimated Date of Delivery: 04/22/13 was seen today in office to discuss trial of labor after cesarean section (TOLAC) versus elective repeat cesarean delivery (ERCD). The following risks were discussed with the patient.  Risk of uterine rupture at term is   0.78 percent with TOLAC (7.03/999)                                               And   0.22 percent with ERCD. (2.09/998)  1 in 10 uterine ruptures will result in neonatal death or  neurological injury to the baby.  The benefits of a trial of labor after cesarean (TOLAC) resulting in a vaginal birth after cesarean (VBAC) include the following:      .shorter length of hospital stay and postpartum recovery (in most cases);      .fewer complications, such as postpartum fever, wound or uterine infection,             thromboembolism (blood clots in the leg or lung), need for blood transfusion      And fewer neonatal breathing problems. The risks of an attempted VBAC or TOLAC include the following: Risk of failed trial of labor after cesarean (TOLAC) without a vaginal birth after cesarean (VBAC) resulting in repeat cesarean delivery (RCD) in about 20 to 40 percent of women who attempt VBAC.  In other words, you may labor, with the goal of vaginal delivery, and still end up needing a cesarean delivery 20 to 40 percent of the time. Risk of rupture of uterus resulting in an emergency cesarean delivery. The risk of uterine rupture may be related in part to the type of uterine incision made during the first cesarean delivery. A previous transverse uterine incision has the lowest risk of rupture (0.2 to 1.5 percent risk). Vertical or T-shaped uterine incisions have a higher risk of uterine rupture (4 to 9 percent risk)The risk of fetal death is very low with both VBAC and elective repeat cesarean delivery (ERCD), but the likelihood of fetal death is higher with VBAC than with ERCD.  Maternal death is very rare with either type of delivery. The risks of an elective repeat cesarean delivery (ERCD) were reviewed with the patient including but not limited to: 09/998 risk of uterine rupture which could have serious consequences, bleeding which may require transfusion; infection which may require antibiotics; injury to bowel, bladder or other surrounding organs (bowel, bladder, ureters); injury to the fetus; need for additional procedures including hysterectomy in the event of a life-threatening hemorrhage; thromboembolic phenomenon; abnormal placentation; incisional problems; death and other postoperative or anesthesia complications.  The benefits of elective repeat cesarean deliver (ERCD) reviewed with the patient. These benefits vary from person to person, and may include: convenience of scheduling on a specific date on or after 39 weeks, or other individual patient's concerns.  These risks and benefits are summarized on the consent form, which was reviewed with the patient during the visit.  All her questions answered and she signed a consent indicating a preference for TOLAC/ERCD. A copy of the consent was given to the patient.  Robin Perez does say that she would/ want tubal sterilization in the event a cesarean section is performed due to any maternal-fetal indication.)  (Patient also desires bilateral tubal sterilization (BTS)  at the time of RCD, also discussed the additional risks of regret, failure rate of 0.5-1%  with increased risk of ectopic .  Other forms of reversible BCM discussed, also discussed vasectomy, patient desires BTS).  Will continue routine postpartum care at [facility].

## 2013-03-15 NOTE — Progress Notes (Signed)
U/S(34+4wks)-vtx active fetus, approp growth EFW 5 lb 10 oz (51st%tile), fluid wnl AFI=10.4cm, ant gr 1 plac, female fetus

## 2013-03-15 NOTE — Progress Notes (Signed)
Pt here today for routine visit. Pt states that she is having pain in her vagina, she has the pain all the time. Pt states she has good fetal movement and denies any bleeding. Pt is just concerned about the vaginal pain. Pt had 1+ protein and 3+ blood in her urine today. Pt also given [facility] prenatal vit samples.

## 2013-03-15 NOTE — Progress Notes (Signed)
1. Desires BTL papers--> sign today 2. R/o uti--> C&S sent 3  Desires TOLAC after 2 prior c/s.  Risk issues reviewed again, will give copy of VBAC discussion forms in Pt Instructions. Will continue to discuss, especially with pt wanting BTL in hospital.

## 2013-04-01 ENCOUNTER — Ambulatory Visit (INDEPENDENT_AMBULATORY_CARE_PROVIDER_SITE_OTHER): Payer: Medicaid Other | Admitting: Obstetrics & Gynecology

## 2013-04-01 ENCOUNTER — Encounter: Payer: Self-pay | Admitting: Obstetrics & Gynecology

## 2013-04-01 VITALS — BP 120/60 | Wt 256.0 lb

## 2013-04-01 DIAGNOSIS — Z331 Pregnant state, incidental: Secondary | ICD-10-CM

## 2013-04-01 DIAGNOSIS — O34219 Maternal care for unspecified type scar from previous cesarean delivery: Secondary | ICD-10-CM

## 2013-04-01 DIAGNOSIS — Z1389 Encounter for screening for other disorder: Secondary | ICD-10-CM

## 2013-04-01 DIAGNOSIS — Z3483 Encounter for supervision of other normal pregnancy, third trimester: Secondary | ICD-10-CM

## 2013-04-01 LAB — POCT URINALYSIS DIPSTICK
Glucose, UA: NEGATIVE
Nitrite, UA: NEGATIVE
Protein, UA: NEGATIVE

## 2013-04-01 NOTE — Patient Instructions (Signed)

## 2013-04-01 NOTE — Progress Notes (Signed)
BP weight and urine results all reviewed and noted. Patient reports good fetal movement, denies any bleeding and no rupture of membranes symptoms or regular contractions. Patient is without complaints. All questions were answered.  

## 2013-04-01 NOTE — Progress Notes (Signed)
FOR GBS/GC/CHL TODAY. 

## 2013-04-01 NOTE — Addendum Note (Signed)
Addended by: Colen Darling on: 04/01/2013 12:15 PM   Modules accepted: Orders

## 2013-04-02 LAB — GC/CHLAMYDIA PROBE AMP
CT Probe RNA: NEGATIVE
GC Probe RNA: NEGATIVE

## 2013-04-08 ENCOUNTER — Encounter: Payer: Medicaid Other | Admitting: Women's Health

## 2013-04-15 ENCOUNTER — Telehealth: Payer: Self-pay | Admitting: Women's Health

## 2013-04-15 NOTE — Telephone Encounter (Signed)
Spoke with pt. Due 04/22/13. Has had 2 previous c sections. Pt states she wants to try and labor. Pt states dr said he was not going to induce pt. Are you going to just schedule a c-section? Thanks!!

## 2013-04-16 NOTE — Telephone Encounter (Signed)
Returned call and answered questions.  Pt wants to continue to wait for spontaneous labor.  I told her that with 2 C sections we would be very hesitant to induce at all.  I informed her if she is not delivered by 41 weeks then a C section wouod be scheduled "around" that time, ie 9/8/2014ish.  Pt understands

## 2013-04-17 ENCOUNTER — Encounter: Payer: Medicaid Other | Admitting: Advanced Practice Midwife

## 2013-04-23 ENCOUNTER — Inpatient Hospital Stay (HOSPITAL_COMMUNITY): Payer: Medicaid Other | Admitting: Anesthesiology

## 2013-04-23 ENCOUNTER — Encounter (HOSPITAL_COMMUNITY): Payer: Self-pay | Admitting: *Deleted

## 2013-04-23 ENCOUNTER — Inpatient Hospital Stay (HOSPITAL_COMMUNITY)
Admission: AD | Admit: 2013-04-23 | Discharge: 2013-04-26 | DRG: 766 | Disposition: A | Discharge status: Home / Self Care | Payer: Medicaid Other | Source: Ambulatory Visit | Attending: Obstetrics & Gynecology | Admitting: Obstetrics & Gynecology

## 2013-04-23 ENCOUNTER — Encounter (HOSPITAL_COMMUNITY): Payer: Self-pay | Admitting: Anesthesiology

## 2013-04-23 ENCOUNTER — Encounter (HOSPITAL_COMMUNITY)
Admission: AD | Disposition: A | Discharge status: Home / Self Care | Payer: Self-pay | Source: Ambulatory Visit | Attending: Obstetrics & Gynecology

## 2013-04-23 DIAGNOSIS — O99892 Other specified diseases and conditions complicating childbirth: Secondary | ICD-10-CM | POA: Diagnosis present

## 2013-04-23 DIAGNOSIS — O34219 Maternal care for unspecified type scar from previous cesarean delivery: Principal | ICD-10-CM

## 2013-04-23 DIAGNOSIS — Z3009 Encounter for other general counseling and advice on contraception: Secondary | ICD-10-CM

## 2013-04-23 DIAGNOSIS — Z302 Encounter for sterilization: Secondary | ICD-10-CM

## 2013-04-23 DIAGNOSIS — O9989 Other specified diseases and conditions complicating pregnancy, childbirth and the puerperium: Secondary | ICD-10-CM

## 2013-04-23 DIAGNOSIS — Z3483 Encounter for supervision of other normal pregnancy, third trimester: Secondary | ICD-10-CM

## 2013-04-23 DIAGNOSIS — Z22330 Carrier of Group B streptococcus: Secondary | ICD-10-CM

## 2013-04-23 LAB — CBC
HCT: 32.3 % — ABNORMAL LOW (ref 36.0–46.0)
Hemoglobin: 10.8 g/dL — ABNORMAL LOW (ref 12.0–15.0)
MCH: 24.6 pg — ABNORMAL LOW (ref 26.0–34.0)
MCHC: 33.4 g/dL (ref 30.0–36.0)
RDW: 15.1 % (ref 11.5–15.5)

## 2013-04-23 LAB — PREPARE RBC (CROSSMATCH)

## 2013-04-23 SURGERY — Surgical Case
Anesthesia: Spinal | Site: Abdomen | Wound class: Clean Contaminated

## 2013-04-23 MED ORDER — PENICILLIN G POTASSIUM 5000000 UNITS IJ SOLR
2.5000 10*6.[IU] | INTRAVENOUS | Status: DC
  Filled 2013-04-23 (×3): qty 2.5

## 2013-04-23 MED ORDER — MEPERIDINE HCL 25 MG/ML IJ SOLN
6.2500 mg | INTRAMUSCULAR | Status: AC | PRN
  Administered 2013-04-23 (×2): 6.25 mg via INTRAVENOUS

## 2013-04-23 MED ORDER — HYDROMORPHONE HCL PF 1 MG/ML IJ SOLN
0.2500 mg | INTRAMUSCULAR | Status: DC | PRN
  Administered 2013-04-23 (×2): 0.5 mg via INTRAVENOUS

## 2013-04-23 MED ORDER — FENTANYL CITRATE 0.05 MG/ML IJ SOLN
INTRAMUSCULAR | Status: DC | PRN
  Administered 2013-04-23: 15 ug via INTRATHECAL

## 2013-04-23 MED ORDER — MORPHINE SULFATE 0.5 MG/ML IJ SOLN
INTRAMUSCULAR | Status: AC
  Filled 2013-04-23: qty 10

## 2013-04-23 MED ORDER — OXYTOCIN 40 UNITS IN LACTATED RINGERS INFUSION - SIMPLE MED: 62.5000 mL/h | INTRAVENOUS | Status: DC

## 2013-04-23 MED ORDER — KETOROLAC TROMETHAMINE 60 MG/2ML IM SOLN
60.0000 mg | Freq: Once | INTRAMUSCULAR | Status: AC | PRN
  Administered 2013-04-23: 60 mg via INTRAMUSCULAR

## 2013-04-23 MED ORDER — MEPERIDINE HCL 25 MG/ML IJ SOLN
INTRAMUSCULAR | Status: AC
  Filled 2013-04-23: qty 1

## 2013-04-23 MED ORDER — DEXTROSE 5 % IV SOLN
2.0000 g | INTRAVENOUS | Status: AC
  Administered 2013-04-23: 2 g via INTRAVENOUS
  Filled 2013-04-23: qty 2

## 2013-04-23 MED ORDER — MORPHINE SULFATE (PF) 0.5 MG/ML IJ SOLN
INTRAMUSCULAR | Status: DC | PRN
  Administered 2013-04-23: .1 mg via INTRATHECAL

## 2013-04-23 MED ORDER — FENTANYL CITRATE 0.05 MG/ML IJ SOLN
100.0000 ug | INTRAMUSCULAR | Status: DC | PRN
  Administered 2013-04-23 (×2): 100 ug via INTRAVENOUS
  Filled 2013-04-23 (×2): qty 2

## 2013-04-23 MED ORDER — MEPERIDINE HCL 25 MG/ML IJ SOLN
INTRAMUSCULAR | Status: DC | PRN
  Administered 2013-04-23: 25 mg via INTRAVENOUS

## 2013-04-23 MED ORDER — FENTANYL CITRATE 0.05 MG/ML IJ SOLN
INTRAMUSCULAR | Status: AC
  Filled 2013-04-23: qty 2

## 2013-04-23 MED ORDER — BUPIVACAINE HCL (PF) 0.25 % IJ SOLN
INTRAMUSCULAR | Status: AC
  Filled 2013-04-23: qty 30

## 2013-04-23 MED ORDER — ONDANSETRON HCL 4 MG/2ML IJ SOLN: 4.0000 mg | Freq: Four times a day (QID) | INTRAMUSCULAR | Status: DC | PRN

## 2013-04-23 MED ORDER — CITRIC ACID-SODIUM CITRATE 334-500 MG/5ML PO SOLN
30.0000 mL | ORAL | Status: DC | PRN
  Administered 2013-04-23: 30 mL via ORAL
  Filled 2013-04-23: qty 15

## 2013-04-23 MED ORDER — IBUPROFEN 600 MG PO TABS: 600.0000 mg | ORAL_TABLET | Freq: Four times a day (QID) | ORAL | Status: DC | PRN

## 2013-04-23 MED ORDER — OXYCODONE-ACETAMINOPHEN 5-325 MG PO TABS: 1.0000 | ORAL_TABLET | ORAL | Status: DC | PRN

## 2013-04-23 MED ORDER — SCOPOLAMINE 1 MG/3DAYS TD PT72
MEDICATED_PATCH | TRANSDERMAL | Status: AC
  Filled 2013-04-23: qty 1

## 2013-04-23 MED ORDER — OXYTOCIN BOLUS FROM INFUSION: 500.0000 mL | INTRAVENOUS | Status: DC

## 2013-04-23 MED ORDER — LACTATED RINGERS IV SOLN
INTRAVENOUS | Status: DC | PRN
  Administered 2013-04-23 (×3): via INTRAVENOUS

## 2013-04-23 MED ORDER — LACTATED RINGERS IV SOLN: 500.0000 mL | INTRAVENOUS | Status: DC | PRN

## 2013-04-23 MED ORDER — BUPIVACAINE HCL (PF) 0.5 % IJ SOLN
INTRAMUSCULAR | Status: DC | PRN
  Administered 2013-04-23: 30 mL

## 2013-04-23 MED ORDER — BUPIVACAINE IN DEXTROSE 0.75-8.25 % IT SOLN
INTRATHECAL | Status: DC | PRN
  Administered 2013-04-23: 11.75 mg via INTRATHECAL

## 2013-04-23 MED ORDER — BUPIVACAINE HCL (PF) 0.5 % IJ SOLN
INTRAMUSCULAR | Status: AC
  Filled 2013-04-23: qty 30

## 2013-04-23 MED ORDER — PENICILLIN G POTASSIUM 5000000 UNITS IJ SOLR
5.0000 10*6.[IU] | Freq: Once | INTRAMUSCULAR | Status: DC
  Filled 2013-04-23: qty 5

## 2013-04-23 MED ORDER — LACTATED RINGERS IV SOLN: INTRAVENOUS | Status: DC

## 2013-04-23 MED ORDER — HYDROMORPHONE HCL PF 1 MG/ML IJ SOLN
INTRAMUSCULAR | Status: AC
  Filled 2013-04-23: qty 1

## 2013-04-23 MED ORDER — OXYTOCIN 10 UNIT/ML IJ SOLN
40.0000 [IU] | INTRAVENOUS | Status: DC | PRN
  Administered 2013-04-23: 40 [IU] via INTRAVENOUS

## 2013-04-23 MED ORDER — LIDOCAINE HCL (PF) 1 % IJ SOLN: 30.0000 mL | INTRAMUSCULAR | Status: DC | PRN

## 2013-04-23 MED ORDER — KETOROLAC TROMETHAMINE 60 MG/2ML IM SOLN
INTRAMUSCULAR | Status: AC
  Filled 2013-04-23: qty 2

## 2013-04-23 MED ORDER — SCOPOLAMINE 1 MG/3DAYS TD PT72
1.0000 | MEDICATED_PATCH | Freq: Once | TRANSDERMAL | Status: DC
  Administered 2013-04-23: 1.5 mg via TRANSDERMAL

## 2013-04-23 MED ORDER — ACETAMINOPHEN 325 MG PO TABS: 650.0000 mg | ORAL_TABLET | ORAL | Status: DC | PRN

## 2013-04-23 SURGICAL SUPPLY — 42 items
BARRIER ADHS 3X4 INTERCEED (GAUZE/BANDAGES/DRESSINGS) ×3 IMPLANT
BENZOIN TINCTURE PRP APPL 2/3 (GAUZE/BANDAGES/DRESSINGS) ×3 IMPLANT
BINDER ABD UNIV 10 28-50 (GAUZE/BANDAGES/DRESSINGS) ×2 IMPLANT
BINDER ABD UNIV 12 45-62 (WOUND CARE) IMPLANT
BINDER ABDOM UNIV 10 (GAUZE/BANDAGES/DRESSINGS) ×3
BINDER ABDOMINAL 46IN 62IN (WOUND CARE)
CLAMP CORD UMBIL (MISCELLANEOUS) IMPLANT
CLIP FILSHIE TUBAL LIGA STRL (Clip) ×3 IMPLANT
CLOTH BEACON ORANGE TIMEOUT ST (SAFETY) ×3 IMPLANT
DRAPE LG THREE QUARTER DISP (DRAPES) ×3 IMPLANT
DRSG OPSITE POSTOP 4X10 (GAUZE/BANDAGES/DRESSINGS) ×3 IMPLANT
DURAPREP 26ML APPLICATOR (WOUND CARE) ×3 IMPLANT
ELECT REM PT RETURN 9FT ADLT (ELECTROSURGICAL) ×3
ELECTRODE REM PT RTRN 9FT ADLT (ELECTROSURGICAL) ×2 IMPLANT
EXTRACTOR VACUUM M CUP 4 TUBE (SUCTIONS) IMPLANT
GLOVE BIO SURGEON STRL SZ7 (GLOVE) ×3 IMPLANT
GLOVE BIOGEL PI IND STRL 7.0 (GLOVE) ×2 IMPLANT
GLOVE BIOGEL PI INDICATOR 7.0 (GLOVE) ×1
GLOVE SURG SS PI 7.5 STRL IVOR (GLOVE) ×3 IMPLANT
GOWN STRL REIN XL XLG (GOWN DISPOSABLE) ×6 IMPLANT
KIT ABG SYR 3ML LUER SLIP (SYRINGE) IMPLANT
NEEDLE HYPO 22GX1.5 SAFETY (NEEDLE) ×3 IMPLANT
NEEDLE HYPO 25X5/8 SAFETYGLIDE (NEEDLE) IMPLANT
NS IRRIG 1000ML POUR BTL (IV SOLUTION) ×3 IMPLANT
PACK C SECTION WH (CUSTOM PROCEDURE TRAY) ×3 IMPLANT
PAD OB MATERNITY 4.3X12.25 (PERSONAL CARE ITEMS) ×3 IMPLANT
RTRCTR C-SECT PINK 25CM LRG (MISCELLANEOUS) IMPLANT
SPONGE LAP 18X18 X RAY DECT (DISPOSABLE) ×3 IMPLANT
SPONGE SURGIFOAM ABS GEL 12-7 (HEMOSTASIS) IMPLANT
STAPLER VISISTAT 35W (STAPLE) IMPLANT
STRIP CLOSURE SKIN 1/2X4 (GAUZE/BANDAGES/DRESSINGS) ×3 IMPLANT
SUT PDS AB 0 CTX 60 (SUTURE) IMPLANT
SUT PLAIN 0 NONE (SUTURE) IMPLANT
SUT SILK 0 TIES 10X30 (SUTURE) IMPLANT
SUT VIC AB 0 CT1 36 (SUTURE) ×9 IMPLANT
SUT VIC AB 3-0 CT1 27 (SUTURE) ×1
SUT VIC AB 3-0 CT1 TAPERPNT 27 (SUTURE) ×2 IMPLANT
SUT VIC AB 4-0 KS 27 (SUTURE) IMPLANT
SYR CONTROL 10ML LL (SYRINGE) ×3 IMPLANT
TOWEL OR 17X24 6PK STRL BLUE (TOWEL DISPOSABLE) ×3 IMPLANT
TRAY FOLEY CATH 14FR (SET/KITS/TRAYS/PACK) ×3 IMPLANT
WATER STERILE IRR 1000ML POUR (IV SOLUTION) IMPLANT

## 2013-04-23 NOTE — H&P (Signed)
Robin Perez is a 27 y.o. female [phone] with IUP at [redacted]w[redacted]d presenting for SROM and contractions.   Pt states she has been having regular, every 2-4 minutes contractions, associated with no vaginal bleeding.  Membranes are ruptured, with active fetal movement.    PNCare at [facility] since 18 wks. Pt has a hx of c/s x2- first for dropped cord and then for repeat.  No complications this pregnancy except for GBS +.  Desires tubal and signed papers 03/15/13 per report.   Prenatal History/Complications:  Past Medical History: Past Medical History  Diagnosis Date  . Vertigo   . Asthma     Past Surgical History: Past Surgical History  Procedure Laterality Date  . Cesarean section    . Cesarean section      Obstetrical History: OB History   Grav Para Term Preterm Abortions TAB SAB Ect Mult Living   3 2 2       2       Social History: History   Social History  . Marital Status: Single    Spouse Name: N/A    Number of Children: N/A  . Years of Education: N/A   Social History Main Topics  . Smoking status: Former Smoker -- 0.50 packs/day    Types: Cigarettes  . Smokeless tobacco: Never Used  . Alcohol Use: No  . Drug Use: Yes    Special: Benzodiazepines     Comment: not now.  . Sexual Activity: Yes    Birth Control/ Protection: None   Other Topics Concern  . None   Social History Narrative  . None    Family History: History reviewed. No pertinent family history.  Allergies: No Known Allergies  Prescriptions prior to admission  Medication Sig Dispense Refill  . Iron-Vitamin C (FE C PO) Take 1 tablet by mouth daily.       . Prenatal Vit-Fe Fumarate-FA (PRENATAL MULTIVITAMIN) TABS tablet Take 1 tablet by mouth daily at 12 noon.         Review of Systems   Constitutional: Negative for fever, chills, weight loss, malaise/fatigue and diaphoresis.  HENT: Negative for hearing loss, ear pain, nosebleeds, congestion, sore throat, neck pain, tinnitus and ear discharge.    Eyes: Negative for blurred vision, double vision, photophobia, pain, discharge and redness.  Respiratory: Negative for cough, hemoptysis, sputum production, shortness of breath, wheezing and stridor.   Cardiovascular: Negative for chest pain, palpitations, orthopnea,  leg swelling  Gastrointestinal: Positive for abdominal pain. Negative for heartburn, nausea, vomiting, diarrhea, constipation, blood in stool Genitourinary: Negative for dysuria, urgency, frequency, hematuria and flank pain.  Musculoskeletal: Negative for myalgias, back pain, joint pain and falls.  Skin: Negative for itching and rash.  Neurological: Negative for dizziness, tingling, tremors, sensory change, speech change, focal weakness, seizures, loss of consciousness, weakness and headaches.  Endo/Heme/Allergies: Negative for environmental allergies and polydipsia. Does not bruise/bleed easily.  Psychiatric/Behavioral: Negative for depression, suicidal ideas, hallucinations, memory loss and substance abuse. The patient is not nervous/anxious and does not have insomnia.       Blood pressure 140/90, pulse 86, temperature 98.1 F (36.7 C), temperature source Oral, resp. rate 18, height 5\' 7"  (1.702 m), weight 115.214 kg (254 lb), last menstrual period 07/16/2012, SpO2 98.00%. General appearance: alert, cooperative and no distress Lungs: clear to auscultation bilaterally Heart: regular rate and rhythm Abdomen: soft, non-tender; bowel sounds normal Extremities: Homans sign is negative, no sign of DVT Presentation: cephalic Fetal monitoringBaseline: 140 bpm, Variability: Fair (1-6 bpm), Accelerations:  Reactive and Decelerations: Absent Uterine activity every 4 min  Dilation: Fingertip Effacement (%): Thick Exam by:: Bernita Buffy, CNM   Prenatal labs: ABO, Rh: B/Positive/-- (02/27 0000) Antibody: NEG (07/15 0925) Rubella:   RPR: NON REAC (07/15 0925)  HBsAg: Negative (02/27 0000)  HIV: NON REACTIVE (07/15 0925)  GBS: POSITIVE  (08/11 1238)  1 hr Glucola 74/144/75 Genetic screening  declined Anatomy US normal  .resultrcnt[24h  Assessment: Robin Perez is a 27 y.o. [phone] with an IUP at [redacted]w[redacted]d presenting for SROM and contractions  Plan: - admit to L&D - pt with two previous c/s. Discussed high risk for uterine rupture.  - given this, recommended c/s which pt agreed  The risks of cesarean section discussed with the patient included but were not limited to: bleeding which may require transfusion or reoperation; infection which may require antibiotics; injury to bowel, bladder, ureters or other surrounding organs; injury to the fetus; need for additional procedures including hysterectomy in the event of a life-threatening hemorrhage; placental abnormalities wth subsequent pregnancies, incisional problems, thromboembolic phenomenon and other postoperative/anesthesia complications. The patient concurred with the proposed plan, giving informed written consent for the procedure.   Patient has been NPO since noon she will remain NPO for procedure. Anesthesia and OR aware. Preoperative prophylactic antibiotics and SCDs ordered on call to the OR.  To OR when ready.     Vale Haven, MD 04/23/2013, 5:33 PM

## 2013-04-23 NOTE — MAU Note (Signed)
Patient reported water broke at 2pm today. Patient c/o of pain with contractions 7/10. Patient denies vaginal bleeding. Pants appear to be wet with light meconium fluid.

## 2013-04-23 NOTE — H&P (Signed)
I spoke with patient about the risks of a vaginal birth following 2 prev c-sections.  Pt said that she wants to 'deliver vaginally but, does not want a complication'  I explained to the patient that there was no way I could guarantee her a vaginal delivery without the risk of complications.  She also wants a sterilization.  Patient wants to proceed with repeat c-section with a bilateral tubal ligation.  Darielle Hancher L. Harraway-Smith, M.D., [facility]

## 2013-04-23 NOTE — Transfer of Care (Signed)
Immediate Anesthesia Transfer of Care Note  Patient: Robin Perez  Procedure(s) Performed: Procedure(s): CESAREAN SECTION WITH BILATERAL TUBAL LIGATION (N/A)  Patient Location: PACU  Anesthesia Type:Spinal  Level of Consciousness: awake, alert  and oriented  Airway & Oxygen Therapy: Patient Spontanous Breathing  Post-op Assessment: Report given to PACU RN and Post -op Vital signs reviewed and stable  Post vital signs: Reviewed and stable  Complications: No apparent anesthesia complications

## 2013-04-23 NOTE — Op Note (Signed)
Sharmon Cheramie L. Harraway-Smith, M.D., FACOG  

## 2013-04-23 NOTE — Anesthesia Preprocedure Evaluation (Addendum)
Anesthesia Evaluation  Patient identified by MRN, date of birth, ID band Patient awake    Reviewed: Allergy & Precautions, H&P , NPO status , Patient's Chart, lab work & pertinent test results, reviewed documented beta blocker date and time   History of Anesthesia Complications Negative for: history of anesthetic complications  Airway Mallampati: III TM Distance: >3 FB Neck ROM: full    Dental no notable dental hx. (+) Teeth Intact   Pulmonary asthma ,  breath sounds clear to auscultation  Pulmonary exam normal       Cardiovascular Exercise Tolerance: Good negative cardio ROS  Rhythm:regular Rate:Normal     Neuro/Psych negative neurological ROS  negative psych ROS   GI/Hepatic negative GI ROS, Neg liver ROS,   Endo/Other  Morbid obesity  Renal/GU negative Renal ROS  negative genitourinary   Musculoskeletal   Abdominal   Peds  Hematology  (+) anemia ,   Anesthesia Other Findings Large tonsils  Reproductive/Obstetrics (+) Pregnancy (h/o C/S x2, SROM, in labor, for repeat C/S)                         Anesthesia Physical Anesthesia Plan  ASA: III and emergent  Anesthesia Plan: Spinal   Post-op Pain Management:    Induction:   Airway Management Planned:   Additional Equipment:   Intra-op Plan:   Post-operative Plan:   Informed Consent: I have reviewed the patients History and Physical, chart, labs and discussed the procedure including the risks, benefits and alternatives for the proposed anesthesia with the patient or authorized representative who has indicated his/her understanding and acceptance.   Dental Advisory Given  Plan Discussed with: CRNA and Surgeon  Anesthesia Plan Comments: (Lab work confirmed with CRNA in room. Platelets okay. Discussed spinal anesthetic, and patient consents to the procedure:  included risk of possible headache,backache, failed block, allergic  reaction, and nerve injury. This patient was asked if she had any questions or concerns before the procedure started. )       Anesthesia Quick Evaluation

## 2013-04-23 NOTE — Op Note (Signed)
Robin Perez PROCEDURE DATE: 04/23/2013  PREOPERATIVE DIAGNOSIS: Intrauterine pregnancy at  [redacted]w[redacted]d weeks gestation; SROM, contractions. previous LTCS x2, desire for sterilization  POSTOPERATIVE DIAGNOSIS: The same  PROCEDURE: Repeat Low Transverse Cesarean Section, bilateral tubal sterilization with filshie clips.   SURGEON:  Dr. Eber Jones L. Harraway-Smith  ASSISTANT:  Dr. Rulon Abide   INDICATIONS: Robin Perez is a 27 y.o. G3P2002 at [redacted]w[redacted]d here for cesarean section secondary to the indications listed under preoperative diagnosis; please see preoperative note for further details.  The risks of cesarean section were discussed with the patient including but were not limited to: bleeding which may require transfusion or reoperation; infection which may require antibiotics; injury to bowel, bladder, ureters or other surrounding organs; injury to the fetus; need for additional procedures including hysterectomy in the event of a life-threatening hemorrhage; placental abnormalities wth subsequent pregnancies, incisional problems, thromboembolic phenomenon and other postoperative/anesthesia complications.   The patient concurred with the proposed plan, giving informed written consent for the procedure. Reviewed with patient in detail the risks of regret with a tubal sterilization.  Confirmed with the patient that a tubal ligation is meant to be permanent and reviewed the risk of failure as 3-11/998.  Patient confirmed that she does want a permanent sterilization with her primary c-section. The patient concurred with the proposed plan, giving informed written consent for the procedure.      FINDINGS:  Viable female infant in cephalic presentation with a nuchal x3.  Apgars 8 and 9.  Meconium stained amniotic fluid.  Intact placenta, three vessel cord.  Normal uterus, fallopian tubes and ovaries bilaterally.  ANESTHESIA: Spinal/Epidural INTRAVENOUS FLUIDS: 2500 ml ESTIMATED BLOOD LOSS: 600 ml URINE  OUTPUT:  100 ml SPECIMENS: Placenta sent to pathology/L&D COMPLICATIONS: None immediate  PROCEDURE IN DETAIL:  The patient preoperatively received intravenous antibiotics and had sequential compression devices applied to her lower extremities.  She was then taken to the operating room where spinal anesthesia was administered and was found to be adequate. She was then placed in a dorsal supine position with a leftward tilt, and prepped and draped in a sterile manner.  A foley catheter was placed into her bladder and attached to constant gravity.  After an adequate timeout was performed, a Pfannenstiel skin incision was made with scalpel and carried through to the underlying layer of fascia. The fascia was incised in the midline, and this incision was extended bilaterally using the Mayo scissors.  Kocher clamps were applied to the superior aspect of the fascial incision and the underlying rectus muscles were dissected off bluntly. A similar process was carried out on the inferior aspect of the fascial incision. The rectus muscles were separated in the midline bluntly and the peritoneum was entered bluntly. Attention was turned to the lower uterine segment where a low transverse hysterotomy incision was made with a scalpel and extended bilaterally bluntly.  The infant was successfully delivered, the cord was clamped and cut and the infant was handed over to awaiting neonatology team. Uterine massage was then administered, and the placenta delivered intact with a three-vessel cord. The uterus was then cleared of clot and debris.  The hysterotomy was closed with 0 Vicryl in a running locked fashion. A layer of 0 chromic was used to reapproximate the uterovesicular reflection.  The Fallopian tubes were identified bilaterally.  A Filshie clip was placed on each tube without difficulty 3 cm from the cornua.  There was no bleeding.  The uterus was returned to the pelvis. The  pelvis was cleared of all clot and debris.  Hemostasis was confirmed on all surfaces.  The peritoneum and the muscles were reapproximated using 0 Vicryl in 1 interrupted suture. The fascia was then closed using 0 Vicryl in a running fashion.  The subcutaneous layer was irrigated, then reapproximated with 3-0 vicryl in a running fashion, and the skin was closed with a 4-0 Vicryl subcuticular stitch. The incision was injected with 30cc of  0.5% Marcaine. The patient tolerated the procedure well. Sponge, lap, instrument and needle counts were correct x 2.  She was taken to the recovery room awake and in stable condition.

## 2013-04-23 NOTE — Anesthesia Procedure Notes (Signed)
Spinal  Patient location during procedure: OR Start time: 04/23/2013 8:32 PM Staffing Performed by: anesthesiologist  Preanesthetic Checklist Completed: patient identified, site marked, surgical consent, pre-op evaluation, timeout performed, IV checked, risks and benefits discussed and monitors and equipment checked Spinal Block Patient position: sitting Prep: site prepped and draped and DuraPrep Patient monitoring: heart rate, cardiac monitor, continuous pulse ox and blood pressure Approach: midline Location: L3-4 Injection technique: single-shot Needle Needle type: Pencan  Needle gauge: 24 G Needle length: 9 cm Assessment Sensory level: T4 Additional Notes Clear free flow CSF on first attempt.  No paresthesia.  Patient tolerated procedure well with no apparent complications.  Jasmine December, MD

## 2013-04-24 ENCOUNTER — Encounter (HOSPITAL_COMMUNITY): Payer: Self-pay | Admitting: *Deleted

## 2013-04-24 LAB — CBC
HCT: 27.4 % — ABNORMAL LOW (ref 36.0–46.0)
MCH: 24.6 pg — ABNORMAL LOW (ref 26.0–34.0)
MCHC: 32.8 g/dL (ref 30.0–36.0)
RDW: 15.1 % (ref 11.5–15.5)

## 2013-04-24 MED ORDER — LACTATED RINGERS IV SOLN: INTRAVENOUS | Status: DC

## 2013-04-24 MED ORDER — NALOXONE HCL 1 MG/ML IJ SOLN
1.0000 ug/kg/h | INTRAVENOUS | Status: DC | PRN
  Filled 2013-04-24: qty 2

## 2013-04-24 MED ORDER — DIBUCAINE 1 % RE OINT: 1.0000 "application " | TOPICAL_OINTMENT | RECTAL | Status: DC | PRN

## 2013-04-24 MED ORDER — NALBUPHINE SYRINGE 5 MG/0.5 ML
5.0000 mg | INJECTION | INTRAMUSCULAR | Status: DC | PRN
  Filled 2013-04-24: qty 1

## 2013-04-24 MED ORDER — ZOLPIDEM TARTRATE 5 MG PO TABS: 5.0000 mg | ORAL_TABLET | Freq: Every evening | ORAL | Status: DC | PRN

## 2013-04-24 MED ORDER — OXYCODONE-ACETAMINOPHEN 5-325 MG PO TABS
1.0000 | ORAL_TABLET | ORAL | Status: DC | PRN
Start: 2013-04-24 — End: 2013-04-26
  Administered 2013-04-24 – 2013-04-25 (×3): 2 via ORAL
  Administered 2013-04-25: 1 via ORAL
  Administered 2013-04-25: 2 via ORAL
  Administered 2013-04-25 – 2013-04-26 (×4): 1 via ORAL
  Filled 2013-04-24 (×2): qty 2
  Filled 2013-04-24 (×2): qty 1
  Filled 2013-04-24: qty 2
  Filled 2013-04-24 (×3): qty 1
  Filled 2013-04-24: qty 2

## 2013-04-24 MED ORDER — LANOLIN HYDROUS EX OINT: 1.0000 "application " | TOPICAL_OINTMENT | CUTANEOUS | Status: DC | PRN

## 2013-04-24 MED ORDER — MEASLES, MUMPS & RUBELLA VAC ~~LOC~~ INJ
0.5000 mL | INJECTION | Freq: Once | SUBCUTANEOUS | Status: DC
  Filled 2013-04-24: qty 0.5

## 2013-04-24 MED ORDER — NALOXONE HCL 0.4 MG/ML IJ SOLN: 0.4000 mg | INTRAMUSCULAR | Status: DC | PRN

## 2013-04-24 MED ORDER — SENNOSIDES-DOCUSATE SODIUM 8.6-50 MG PO TABS
2.0000 | ORAL_TABLET | Freq: Every day | ORAL | Status: DC
  Administered 2013-04-24 – 2013-04-25 (×2): 2 via ORAL

## 2013-04-24 MED ORDER — DIPHENHYDRAMINE HCL 25 MG PO CAPS: 25.0000 mg | ORAL_CAPSULE | ORAL | Status: DC | PRN

## 2013-04-24 MED ORDER — ONDANSETRON HCL 4 MG PO TABS: 4.0000 mg | ORAL_TABLET | ORAL | Status: DC | PRN

## 2013-04-24 MED ORDER — SODIUM CHLORIDE 0.9 % IJ SOLN: 3.0000 mL | INTRAMUSCULAR | Status: DC | PRN

## 2013-04-24 MED ORDER — IBUPROFEN 600 MG PO TABS
600.0000 mg | ORAL_TABLET | Freq: Four times a day (QID) | ORAL | Status: DC
  Administered 2013-04-24 – 2013-04-26 (×9): 600 mg via ORAL
  Filled 2013-04-24 (×10): qty 1

## 2013-04-24 MED ORDER — ONDANSETRON HCL 4 MG/2ML IJ SOLN: 4.0000 mg | INTRAMUSCULAR | Status: DC | PRN

## 2013-04-24 MED ORDER — METOCLOPRAMIDE HCL 5 MG/ML IJ SOLN: 10.0000 mg | Freq: Three times a day (TID) | INTRAMUSCULAR | Status: DC | PRN

## 2013-04-24 MED ORDER — SIMETHICONE 80 MG PO CHEW
80.0000 mg | CHEWABLE_TABLET | Freq: Three times a day (TID) | ORAL | Status: DC
  Administered 2013-04-24 – 2013-04-25 (×6): 80 mg via ORAL

## 2013-04-24 MED ORDER — NALBUPHINE SYRINGE 5 MG/0.5 ML
5.0000 mg | INJECTION | INTRAMUSCULAR | Status: DC | PRN
Start: 2013-04-24 — End: 2013-04-26
  Filled 2013-04-24: qty 1

## 2013-04-24 MED ORDER — WITCH HAZEL-GLYCERIN EX PADS: 1.0000 "application " | MEDICATED_PAD | CUTANEOUS | Status: DC | PRN

## 2013-04-24 MED ORDER — DIPHENHYDRAMINE HCL 50 MG/ML IJ SOLN: 25.0000 mg | INTRAMUSCULAR | Status: DC | PRN

## 2013-04-24 MED ORDER — KETOROLAC TROMETHAMINE 30 MG/ML IJ SOLN: 30.0000 mg | Freq: Four times a day (QID) | INTRAMUSCULAR | Status: AC | PRN

## 2013-04-24 MED ORDER — PRENATAL MULTIVITAMIN CH
1.0000 | ORAL_TABLET | Freq: Every day | ORAL | Status: DC
  Administered 2013-04-24: 1 via ORAL

## 2013-04-24 MED ORDER — DIPHENHYDRAMINE HCL 25 MG PO CAPS: 25.0000 mg | ORAL_CAPSULE | Freq: Four times a day (QID) | ORAL | Status: DC | PRN

## 2013-04-24 MED ORDER — TETANUS-DIPHTH-ACELL PERTUSSIS 5-2.5-18.5 LF-MCG/0.5 IM SUSP: 0.5000 mL | Freq: Once | INTRAMUSCULAR | Status: DC

## 2013-04-24 MED ORDER — SIMETHICONE 80 MG PO CHEW
80.0000 mg | CHEWABLE_TABLET | ORAL | Status: DC | PRN
  Administered 2013-04-26: 80 mg via ORAL

## 2013-04-24 MED ORDER — MENTHOL 3 MG MT LOZG: 1.0000 | LOZENGE | OROMUCOSAL | Status: DC | PRN

## 2013-04-24 MED ORDER — ONDANSETRON HCL 4 MG/2ML IJ SOLN: 4.0000 mg | Freq: Three times a day (TID) | INTRAMUSCULAR | Status: DC | PRN

## 2013-04-24 MED ORDER — MAGNESIUM HYDROXIDE 400 MG/5ML PO SUSP: 30.0000 mL | ORAL | Status: DC | PRN

## 2013-04-24 MED ORDER — OXYTOCIN 40 UNITS IN LACTATED RINGERS INFUSION - SIMPLE MED: 62.5000 mL/h | INTRAVENOUS | Status: AC

## 2013-04-24 MED ORDER — DIPHENHYDRAMINE HCL 50 MG/ML IJ SOLN: 12.5000 mg | INTRAMUSCULAR | Status: DC | PRN

## 2013-04-24 NOTE — Anesthesia Postprocedure Evaluation (Signed)
  Anesthesia Post-op Note  Patient: Robin Perez  Procedure(s) Performed: Procedure(s): CESAREAN SECTION WITH BILATERAL TUBAL LIGATION (N/A)  Patient Location: Mother/Baby  Anesthesia Type:Spinal  Level of Consciousness: awake  Airway and Oxygen Therapy: Patient Spontanous Breathing  Post-op Pain: mild  Post-op Assessment: Patient's Cardiovascular Status Stable and Respiratory Function Stable  Post-op Vital Signs: stable  Complications: No apparent anesthesia complications

## 2013-04-24 NOTE — Progress Notes (Signed)
UR chart review completed.  

## 2013-04-24 NOTE — Progress Notes (Signed)
Subjective: Postpartum Day 1: Cesarean Delivery [phone] patient reports incisional pain. She states that she has experienced vaginal bleeding similar in amount to her menses. She denies any discharge. Patient currently has foley catheter in place. She denies any nausea, vomiting, SOB, chest pain, dizziness, or edema.   Objective: Vital signs in last 24 hours: Temp:  [97.7 F (36.5 C)-99 F (37.2 C)] 98.3 F (36.8 C) (09/03 0655) Pulse Rate:  [51-101] 64 (09/03 0655) Resp:  [13-23] 18 (09/03 0655) BP: (115-147)/(63-90) 116/67 mmHg (09/03 0655) SpO2:  [96 %-99 %] 98 % (09/03 0655) Weight:  [115.214 kg (254 lb)] 115.214 kg (254 lb) (09/02 1639)  Physical Exam:  General: alert, cooperative and no distress Lochia: appropriate Uterine Fundus: firm Incision: no significant drainage, no dehiscence, no significant erythema DVT Evaluation: No evidence of DVT seen on physical exam. Negative Homan's sign. No cords or calf tenderness. No significant calf/ankle edema.   Recent Labs  04/23/13 1700 04/24/13 0550  HGB 10.8* 9.0*  HCT 32.3* 27.4*    Assessment/Plan: Status post Cesarean section. Doing well postoperatively.  Continue current care. Patient plans to bottle feed and would like to have circumcision of her baby performed here at the hospital.   Coy Saunas 04/24/2013, 9:30 AM  Pt was seen and examined by me.  No concerns identified.  Proceed with routine post op care. Tavaughn Silguero L. Harraway-Smith, M.D., [facility]

## 2013-04-24 NOTE — Anesthesia Postprocedure Evaluation (Signed)
  Anesthesia Post-op Note  Anesthesia Post Note  Patient: Robin Perez  Procedure(s) Performed: Procedure(s) (LRB): CESAREAN SECTION WITH BILATERAL TUBAL LIGATION (N/A)  Anesthesia type: Spinal  Patient location: PACU  Post pain: Pain level controlled  Post assessment: Post-op Vital signs reviewed  Last Vitals:  Filed Vitals:   04/24/13 0000  BP: 128/63  Pulse: 51  Temp: 36.7 C  Resp: 13    Post vital signs: Reviewed  Level of consciousness: awake  Complications: No apparent anesthesia complications

## 2013-04-25 ENCOUNTER — Encounter (HOSPITAL_COMMUNITY): Payer: Self-pay | Admitting: Obstetrics & Gynecology

## 2013-04-25 NOTE — Progress Notes (Signed)
Subjective: Postpartum Day 1: Cesarean Delivery Patient reports incisional pain, tolerating PO and no problems voiding.    Objective: Vital signs in last 24 hours: Temp:  [97.5 F (36.4 C)-98.3 F (36.8 C)] 97.5 F (36.4 C) (09/04 0636) Pulse Rate:  [56-76] 56 (09/04 0636) Resp:  [18] 18 (09/04 0636) BP: (107-124)/(64-80) 117/64 mmHg (09/04 0636) SpO2:  [97 %-98 %] 98 % (09/04 0636)  Physical Exam:  General: alert, cooperative and no distress Lochia: appropriate Uterine Fundus: firm Incision: healing well DVT Evaluation: No evidence of DVT seen on physical exam.   Recent Labs  04/23/13 1700 04/24/13 0550  HGB 10.8* 9.0*  HCT 32.3* 27.4*    Assessment/Plan: Status post Cesarean section. Doing well postoperatively.  Plan to discharge home tomorrow. S/p BTL.  Kelseigh Diver H 04/25/2013, 7:43 AM

## 2013-04-26 MED ORDER — OXYCODONE-ACETAMINOPHEN 5-325 MG PO TABS: 1.0000 | ORAL_TABLET | ORAL | Status: DC | PRN

## 2013-04-26 MED ORDER — IBUPROFEN 600 MG PO TABS: 600.0000 mg | ORAL_TABLET | Freq: Four times a day (QID) | ORAL | Status: DC

## 2013-04-26 NOTE — Discharge Summary (Signed)
Obstetric Discharge Summary Reason for Admission: onset of labor and cesarean section Prenatal Procedures: none Intrapartum Procedures: cesarean: low cervical, transverse and tubal ligation Postpartum Procedures: none Complications-Operative and Postpartum: none Hemoglobin  Date Value Range Status  04/24/2013 9.0* 12.0 - 15.0 g/dL Final     HCT  Date Value Range Status  04/24/2013 27.4* 36.0 - 46.0 % Final    Physical Exam:  General: alert, cooperative and mild distress Lochia: appropriate Uterine Fundus: firm Incision: healing well, no significant drainage, no significant erythema DVT Evaluation: Negative Homan's sign. Calf/Ankle edema is present bilateral (Mild 1+ to mid shin)  Discharge Diagnoses: Term Pregnancy-delivered  Discharge Information: Date: 04/26/2013 Activity: unrestricted Diet: routine Medications: Ibuprofen and Percocet Condition: stable Instructions: refer to practice specific booklet Discharge to: home  Robin Perez  is a 27yo [id number] POD #3 who presented with SROM and frequent contractions. She had previous LTCS x 2 and it was decided to have RLTCS with BTL. She is currently doing well other than some breakthrough pain between doses of Percocet and Ibuprofen. Bleeding is comparable to her periods, and she is active.  She is urinating well and passing gas, but has yet to have a BM since surg. She denies N, V, CP, dizziness, and edema. She plans on bottle feeding and having her newborn son circumcised at [facility].  Newborn Data: Live born female  Birth Weight: 8 lb 4.5 oz (3755 g) APGAR: 8, 9  Home with mother.  Robin Perez 04/26/2013, 8:05 AM  I have seen and examined this patient and agree with above documentation in the PA student's note.   Rulon Abide, M.D. [facility] Fellow 04/26/2013 9:06 AM

## 2013-04-27 LAB — TYPE AND SCREEN

## 2013-04-29 ENCOUNTER — Telehealth: Payer: Self-pay | Admitting: *Deleted

## 2013-04-29 ENCOUNTER — Encounter: Payer: Medicaid Other | Admitting: Advanced Practice Midwife

## 2013-04-29 NOTE — Telephone Encounter (Signed)
Robin Perez, [facility] WIC states pt HGB 9.2 and pt taking PNV. Joellyn Haff, CNM informed of HGB of 9.2 and pt taking PNV. No new orders given.

## 2013-04-30 ENCOUNTER — Telehealth: Payer: Self-pay | Admitting: Obstetrics & Gynecology

## 2013-04-30 NOTE — Telephone Encounter (Signed)
Pt states c-section and tubal ligation on 04/23/2013. Requesting refill on Oxycodone 5/325 mg. Pt informed will need to pick up RX.

## 2013-05-01 ENCOUNTER — Telehealth: Payer: Self-pay | Admitting: Obstetrics & Gynecology

## 2013-05-01 MED ORDER — OXYCODONE-ACETAMINOPHEN 5-325 MG PO TABS: 1.0000 | ORAL_TABLET | ORAL | Status: DC | PRN

## 2013-05-01 NOTE — Telephone Encounter (Signed)
Pt stated that the front office told her that the rx was at the front waiting on her.

## 2013-05-08 ENCOUNTER — Telehealth: Payer: Self-pay | Admitting: Obstetrics & Gynecology

## 2013-05-08 NOTE — Telephone Encounter (Signed)
Pt states had c-section, Sept 2, 2014, took off dressing notice redness and "insides tissue" no fever. Pt stated had started new job. Pt informed she was not to be working until she could be seen by a provider and given an ok to return to work. Call transferred to front staff for an appt to be made asap. Pt informed to keep incision site clean and dry. Pt verbalized understanding.

## 2013-05-09 ENCOUNTER — Ambulatory Visit (INDEPENDENT_AMBULATORY_CARE_PROVIDER_SITE_OTHER): Payer: Medicaid Other | Admitting: Adult Health

## 2013-05-09 ENCOUNTER — Encounter: Payer: Self-pay | Admitting: Adult Health

## 2013-05-09 VITALS — BP 128/80 | Ht 67.0 in | Wt 233.0 lb

## 2013-05-09 DIAGNOSIS — O909 Complication of the puerperium, unspecified: Secondary | ICD-10-CM

## 2013-05-09 DIAGNOSIS — L7682 Other postprocedural complications of skin and subcutaneous tissue: Secondary | ICD-10-CM

## 2013-05-09 HISTORY — DX: Other postprocedural complications of skin and subcutaneous tissue: L76.82

## 2013-05-09 MED ORDER — PRENATAL MULTIVITAMIN CH: 1.0000 | ORAL_TABLET | Freq: Every day | ORAL | Status: DC

## 2013-05-09 MED ORDER — CIPROFLOXACIN HCL 500 MG PO TABS: 500.0000 mg | ORAL_TABLET | Freq: Two times a day (BID) | ORAL | Status: DC

## 2013-05-09 NOTE — Progress Notes (Signed)
Subjective:     Patient ID: Robin Perez, female   DOB: [birth year], 27 y.o.   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  Review of Systems See HPI Reviewed past medical,surgical, social and family history. Reviewed medications and allergies.     Objective:   Physical Exam BP 128/80  Ht 5\' 7"  (1.702 m)  Wt 233 lb (105.688 kg)  BMI 36.48 kg/m2  Breastfeeding? No   Skin warm and dry, C-section scar edges together but there is small area of "proud flesh", no drainage or discharge, is tender with palpation, no redness or heat, and Dr Despina Hidden in to co examine.  Assessment:     Incisional pain    Plan:    Take motrin regularly and rest,can take 2 percocet if needed,( and she says they do not help her) Rx Cirpo 500 mg 1 bid x 7 days #14 no refills   Refilled prenatal vitamins x 6 Return in 2 weeks for postpartum check NO work til follow up Use pillow or hand for support on abdomen when rising

## 2013-05-09 NOTE — Patient Instructions (Addendum)
Take cipro as directed Take motrin regularly NO work  Return in 2 weeks for postpartum

## 2013-05-14 ENCOUNTER — Telehealth: Payer: Self-pay | Admitting: Obstetrics and Gynecology

## 2013-05-14 NOTE — Telephone Encounter (Signed)
Spoke with pt. She was not seen on 04/29/13 but was seen on 05/09/13. In the note, Robin Perez stated no work until after postpartum visit. I explained this to pt. To call tomorrow to schedule postpartum visit. [name]

## 2013-05-23 ENCOUNTER — Ambulatory Visit: Payer: Medicaid Other | Admitting: Adult Health

## 2013-05-28 ENCOUNTER — Ambulatory Visit: Payer: Medicaid Other | Admitting: Adult Health

## 2013-05-31 ENCOUNTER — Ambulatory Visit: Payer: Medicaid Other | Admitting: Adult Health

## 2013-06-05 ENCOUNTER — Ambulatory Visit: Payer: Medicaid Other | Admitting: Adult Health

## 2013-07-31 ENCOUNTER — Encounter: Payer: Self-pay | Admitting: *Deleted

## 2013-07-31 NOTE — Progress Notes (Unsigned)
Patient ID: Robin Perez, female   DOB: 28-Jan-1986, 27 y.o.   MRN: [id number]   Called pt to rs her postpartum appt.  Had to leave message with her children's aunt for her to call us back.  07/31/13  AS

## 2014-03-05 ENCOUNTER — Encounter (HOSPITAL_COMMUNITY): Payer: Self-pay | Admitting: Emergency Medicine

## 2014-03-05 ENCOUNTER — Emergency Department (HOSPITAL_COMMUNITY)
Admission: EM | Admit: 2014-03-05 | Discharge: 2014-03-05 | Disposition: A | Discharge status: Home / Self Care | Payer: Medicaid Other | Attending: Emergency Medicine | Admitting: Emergency Medicine

## 2014-03-05 DIAGNOSIS — Z87891 Personal history of nicotine dependence: Secondary | ICD-10-CM | POA: Insufficient documentation

## 2014-03-05 DIAGNOSIS — H579 Unspecified disorder of eye and adnexa: Secondary | ICD-10-CM | POA: Diagnosis present

## 2014-03-05 DIAGNOSIS — H02402 Unspecified ptosis of left eyelid: Secondary | ICD-10-CM

## 2014-03-05 DIAGNOSIS — H02409 Unspecified ptosis of unspecified eyelid: Secondary | ICD-10-CM | POA: Diagnosis not present

## 2014-03-05 DIAGNOSIS — H5789 Other specified disorders of eye and adnexa: Secondary | ICD-10-CM

## 2014-03-05 DIAGNOSIS — J45909 Unspecified asthma, uncomplicated: Secondary | ICD-10-CM | POA: Insufficient documentation

## 2014-03-05 MED ORDER — TETRACAINE HCL 0.5 % OP SOLN
2.0000 [drp] | Freq: Once | OPHTHALMIC | Status: AC
  Administered 2014-03-05: 2 [drp] via OPHTHALMIC
  Filled 2014-03-05: qty 2

## 2014-03-05 MED ORDER — HYPROMELLOSE (GONIOSCOPIC) 2.5 % OP SOLN: 2.0000 [drp] | Freq: Three times a day (TID) | OPHTHALMIC | Status: DC

## 2014-03-05 MED ORDER — FLUORESCEIN SODIUM 1 MG OP STRP
1.0000 | ORAL_STRIP | Freq: Once | OPHTHALMIC | Status: AC
Start: 2014-03-05 — End: 2014-03-05
  Administered 2014-03-05: 1 via OPHTHALMIC
  Filled 2014-03-05: qty 1

## 2014-03-05 NOTE — Discharge Instructions (Signed)
Ptosis  Ptosis is drooping of an eyelid, usually the upper eyelid. When the lower eyelid is droopy and turns outward, the condition is called ectropion. Ptosis is common in the elderly, but also may be present at birth (congenital). Or it may be due to many other disorders, diseases, or injuries. Ptosis may occur in one or both upper eyelids. CAUSES   Aging. With age, the muscles that hold the eyelid up may become weak, causing one or both upper eyelids to droop.  Trauma. Injury can cause damage to the nerve that controls the upper eyelid muscle or to the muscle itself.  Previous eye surgery. Ptosis is often seen after eye surgery, even if the eyelid itself was not operated on.  Myasthenia Gravis. This is a disease that causes weakening of the body's muscles, as they tire.  Horner's Syndrome. A combination of findings that can be from nerve damage, present at birth, or from unknown causes. With Horner Syndrome, one upper eyelid is droopy and the pupil (black circle) on the same side is smaller.  Bell's Palsy (appears like Ptosis of the opposite eye). Bell's palsy can occur from a virus infection or for unknown reasons. In this condition, one side of the face droops, including the cheek and side of the mouth. Usually, a person with Bell's Palsy cannot shut the eye on the affected side, so that eye must be watched very carefully for permanent damage that can happen from exposure. In Bell's palsy, the eye on the weakened side is actually more open than the eye on the unaffected side, making the normal eye look like it may be droopy. This is a "false ptosis."  Unknown causes.  Blepharochalsis. This is due to age and too much skin on the upper eyelids. It is not a true ptosis. The extra skin weighs the upper lids down and hangs over the eyelid margin, covering the upper part of the eye.  Eyelid tumors.  Brain tumors.  Tumors behind the eye, in the eye socket.  Diabetes.  Certain toxins or  snake venoms.  Certain drugs. TREATMENT  The treatment of ptosis depends on its cause. In acquired ptosis (developing in a previously normal eyelid) a full medical evaluation must be done to discover the cause. The treatment of this condition requires an operation, or treatment for the disease that caused the condition. If surgery is suggested, there are several types of surgery. Your eye surgeon will explain the advantages and disadvantages of each.   SEEK IMMEDIATE MEDICAL CARE IF:   You have a drooping upper eyelid that has developed suddenly, or over a short period of time, with or without other symptoms. After surgery:  There is increasing redness, swelling, or pain in the wound or around the eye.  Pus is coming from the wound.  You or your child has an oral temperature above 102 F (38.9 C), not controlled by medicine.  The wound breaks open (edges not staying together) after stitches (sutures) have been removed.  There is a change in your vision. Document Released: 05/03/2001 Document Revised: 10/31/2011 Document Reviewed: 06/30/2009 [facility] Patient Information 2015 [place], [place]. This information is not intended to replace advice given to you by your health care provider. Make sure you discuss any questions you have with your health care provider.   Artificial Tears eye solution What is this medicine? ARTIFICIAL TEARS (ahr tuh FISH uhl teerz) eye solution soothes irritation and discomfort caused by dry eyes. This medicine may be used for other purposes;  ask your health care provider or pharmacist if you have questions. COMMON BRAND NAME(S): Akwa Tears, Akwa Tears Renewed, Artificial Tears, Bion Tears, Blink Tears, Clear eyes, Clear eyes Outdoor Dry Eye Protection, FreshKote, GenTeal Mild, GenTeal Moderate, GenTeal PF, Gonak, Goniosoft, Hypo Tears, Isopto Tears, LiquiTears, Lubricating Plus, Moisture Eyes, Moisture Eyes Preservative Free, Natural Balance Tears, Nature's  Tears, Puralube Tears, Refresh, Refresh Celluvisc, Refresh Contacts Comfort, Refresh Endura, Refresh Optive, Refresh Optive Sensitive, Refresh Plus, Refresh Tears, Retaine CMC, Systane Balance, Teargen, Tears Naturale Forte, Tears Naturale II, Tears Renewed, TheraTears, Visine Advanced, Visine Pure Tears, Visine Tears, Visine Tired Eye Relief, Viva What should I tell my health care provider before I take this medicine? -change in vision -eye infection or trauma -wear contact lenses -an unusual or allergic reaction to artificial tears, other medicines, foods, dyes, or preservatives -pregnant or trying to get pregnant -breast-feeding How should I use this medicine? This medicine is only for use in the eye. Do not take by mouth. Follow the directions on the label. Wash hands before and after use. Tilt the head back slightly and pull down the lower eyelid with your index finger to form a pouch. Try not to touch the tip of the dropper to your eye, fingertips, or any other surface. Squeeze the prescribed number of drops (usually one or two drops) into the pouch. Close the eye gently for a few moments to allow the drops to be in contact with the eye. Use your medicine at regular intervals. Do not use your medicine more often than directed. Talk to your pediatrician regarding the use of this medicine in children. While this medicine may be used in children as young as 6 years for selected conditions, precautions do apply. Overdosage: If you think you have taken too much of this medicine contact a poison control center or emergency room at once. NOTE: This medicine is only for you. Do not share this medicine with others. What if I miss a dose? If you miss a dose, use it as soon as you can. If it is almost time for your next dose, use only that dose. Do not use double or extra doses. What may interact with this medicine? Interactions are not expected. If you are using other eye drops with this medicine,  separate the application of the different eye drops by roughly 5 minutes. This ensures that the eye drops do not interfere with each other. If you are using both eye drops and an eye ointment, use the eye drops 10 minutes before the eye ointment so that the eye ointment does not interfere with the action of the drops. This list may not describe all possible interactions. Give your health care provider a list of all the medicines, herbs, non-prescription drugs, or dietary supplements you use. Also tell them if you smoke, drink alcohol, or use illegal drugs. Some items may interact with your medicine. What should I watch for while using this medicine? If you experience eye pain, changes in vision, continued redness or irritation of the eye, or if your eye condition gets worse or lasts longer than 72 hours, discontinue use and consult your health care professional. To avoid contamination of this product, do not touch the tip of the container to any surface. Do not share this medicine with others. If the product changes color or becomes cloudy, do not use. If you wear contact lenses, you should remove them before putting the drops in your eyes. Wait at least 15 minutes after putting  the drops in your eyes before putting your contact lenses back in. What side effects may I notice from receiving this medicine? Side effects that you should report to your doctor or health care professional as soon as possible: -allergic reactions like skin rash, itching or hives, swelling of the face, lips, or tongue -change in vision -eye irritation or redness that gets worse or lasts more than 72 hours -eye pain Side effects that usually do not require medical attention (report to your doctor or health care professional if they continue or are bothersome): -temporary stinging or blurred vision when applying the eye drops This list may not describe all possible side effects. Call your doctor for medical advice about side  effects. You may report side effects to FDA at 1-800-FDA-1088. Where should I keep my medicine? Keep out of the reach of children. Store at room temperature between 15 and 30 degrees C (59 and 86 degrees F). Do not freeze. Throw away any unused medicine after the expiration date. Once the product is opened, most experts recommend discarding the product after 30 days. NOTE: This sheet is a summary. It may not cover all possible information. If you have questions about this medicine, talk to your doctor, pharmacist, or health care provider.  2015, Elsevier/Gold Standard. (2008-02-08 14:24:03)

## 2014-03-05 NOTE — ED Provider Notes (Addendum)
TIME SEEN: 6:45 PM  CHIEF COMPLAINT: Eye pain, blurry vision  HPI: Patient is a 28 year old female with history of asthma who presents to the emergency department with foreign body sensation in her left eye that she's had since she was in 11th grade. She states she was seen by Dr. Mayford Knife in [place] with ophthalmology and told she had "allergic bumps" on the inside of her upper eyelid. She states that she's always knows that her eyelid slightly drooped and she states "I'm tired of being made fun of for this". She states she's been on antibiotics several times without relief. She is requesting that we perform surgery today to remove what she feels like is a cyst in the inside of her upper left eyelid. She denies any other trauma. She states she will have occasional tearing but no purulent drainage. She states that she has had some blurry vision for several years. She does wear contacts. No headache, nausea or vomiting. No vision loss.  ROS: See HPI Constitutional: no fever  Eyes: no drainage  ENT: no runny nose   Cardiovascular:  no chest pain  Resp: no SOB  GI: no vomiting GU: no dysuria Integumentary: no rash  Allergy: no hives  Musculoskeletal: no leg swelling  Neurological: no slurred speech ROS otherwise negative  PAST MEDICAL HISTORY/PAST SURGICAL HISTORY:  Past Medical History  Diagnosis Date  . Vertigo   . Asthma   . Incisional pain 05/09/2013    Will rx cipro    MEDICATIONS:  Prior to Admission medications   Not on File    ALLERGIES:  No Known Allergies  SOCIAL HISTORY:  History  Substance Use Topics  . Smoking status: Former Smoker -- 0.50 packs/day    Types: Cigarettes  . Smokeless tobacco: Never Used  . Alcohol Use: No    FAMILY HISTORY: No family history on file.  EXAM: BP 163/100  Pulse 79  Temp(Src) 98.5 F (36.9 C) (Oral)  Resp 24  Ht 5\' 7"  (1.702 m)  Wt 222 lb (100.699 kg)  BMI 34.76 kg/m2  SpO2 97%  LMP 02/21/2014 CONSTITUTIONAL: Alert and  oriented and responds appropriately to questions. Well-appearing; well-nourished HEAD: Normocephalic EYES: Conjunctivae clear, PERRL, extraocular movements intact, patient has very mild ptosis of the left upper eyelid when compared to the right, no pain with consensual light response; funduscopic exam is difficult in this patient but no sign of retinal detachment or papilledema, eyelids everted and patient does have a 1 mm yellow papule on the inside of her left upper lid with no drainage that is tender when palpated with a Q-tip; no lesions on the eyelid margins, no evidence swelling or erythema, no hyphema or hypopyon, no hordeolum or chalazion, no signs of cellulitis, no foreign body appreciated ENT: normal nose; no rhinorrhea; moist mucous membranes; pharynx without lesions noted NECK: Supple, no meningismus, no LAD  CARD: RRR; S1 and S2 appreciated; no murmurs, no clicks, no rubs, no gallops RESP: Normal chest excursion without splinting or tachypnea; breath sounds clear and equal bilaterally; no wheezes, no rhonchi, no rales,  ABD/GI: Normal bowel sounds; non-distended; soft, non-tender, no rebound, no guarding BACK:  The back appears normal and is non-tender to palpation, there is no CVA tenderness EXT: Normal ROM in all joints; non-tender to palpation; no edema; normal capillary refill; no cyanosis    SKIN: Normal color for age and race; warm NEURO: Moves all extremities equally, mild ptosis of the left upper eyelid otherwise cranial nerves II through XII intact,  sensation to light touch intact diffusely PSYCH: The patient's mood and manner are appropriate. Grooming and personal hygiene are appropriate.  MEDICAL DECISION MAKING: Patient here with mild ptosis of her left upper eyelid which has been present for years and a small papule inside the upper eyelid. No sign of infection, site, periorbital cellulitis. No obvious sign of infection on exam. Have discussed with patient that given she has  had the symptoms for almost 10 years, I am not concerned for a life-threatening illness. Will check visual acuity. We'll also check for corneal abrasion. We'll give Dr. Gwen PoundsKowalski followup information for ophthalmology she states she does not want to follow up with Dr. Mayford Knifeurner anymore.  ED PROGRESS: Patient has no corneal abrasion or ulcer seen with fluorescein staining. She states that when her eye has been anesthetized using tetracaine, she no longer feels pain when closing her eyelids but still can feel something touching on the upper medial part of her eye.  Visual acuity is normal. Discussed with patient that I feel she is safe to go home and followup with ophthalmology as an outpatient. She verbalized understanding.     Layla MawKristen N Ward, DO 03/05/14 1931  Layla MawKristen N Ward, DO 03/05/14 1935  Layla MawKristen N Ward, DO 03/05/14 1938

## 2014-03-05 NOTE — ED Notes (Signed)
Hx of cyst of left eye for "years". Wants to see about "getting surgery to get it removed". Admits to pain, blurred vision and clear morning eye drainage. Patient states blurred vision progressively worsen since last two weeks.

## 2014-03-05 NOTE — ED Notes (Signed)
Visual acuity 20/25 both eyes

## 2014-06-23 ENCOUNTER — Encounter (HOSPITAL_COMMUNITY): Payer: Self-pay | Admitting: Emergency Medicine

## 2014-07-15 ENCOUNTER — Encounter (HOSPITAL_COMMUNITY): Payer: Self-pay | Admitting: Emergency Medicine

## 2014-07-15 ENCOUNTER — Emergency Department (HOSPITAL_COMMUNITY)
Admission: EM | Admit: 2014-07-15 | Discharge: 2014-07-16 | Disposition: A | Discharge status: Home / Self Care | Payer: Medicaid Other | Attending: Emergency Medicine | Admitting: Emergency Medicine

## 2014-07-15 ENCOUNTER — Emergency Department (HOSPITAL_COMMUNITY): Payer: Medicaid Other

## 2014-07-15 DIAGNOSIS — N390 Urinary tract infection, site not specified: Secondary | ICD-10-CM | POA: Insufficient documentation

## 2014-07-15 DIAGNOSIS — R0981 Nasal congestion: Secondary | ICD-10-CM | POA: Diagnosis not present

## 2014-07-15 DIAGNOSIS — Z3202 Encounter for pregnancy test, result negative: Secondary | ICD-10-CM | POA: Diagnosis not present

## 2014-07-15 DIAGNOSIS — Z792 Long term (current) use of antibiotics: Secondary | ICD-10-CM | POA: Diagnosis not present

## 2014-07-15 DIAGNOSIS — Z79899 Other long term (current) drug therapy: Secondary | ICD-10-CM | POA: Diagnosis not present

## 2014-07-15 DIAGNOSIS — R109 Unspecified abdominal pain: Secondary | ICD-10-CM | POA: Diagnosis not present

## 2014-07-15 DIAGNOSIS — R300 Dysuria: Secondary | ICD-10-CM | POA: Diagnosis present

## 2014-07-15 LAB — URINALYSIS, ROUTINE W REFLEX MICROSCOPIC
Bilirubin Urine: NEGATIVE
GLUCOSE, UA: NEGATIVE mg/dL
KETONES UR: NEGATIVE mg/dL
NITRITE: POSITIVE — AB
PROTEIN: 100 mg/dL — AB
Specific Gravity, Urine: 1.025 (ref 1.005–1.030)
UROBILINOGEN UA: 0.2 mg/dL (ref 0.0–1.0)
pH: 6 (ref 5.0–8.0)

## 2014-07-15 LAB — COMPREHENSIVE METABOLIC PANEL
ALT: 6 U/L (ref 0–35)
ANION GAP: 12 (ref 5–15)
AST: 12 U/L (ref 0–37)
Albumin: 3.4 g/dL — ABNORMAL LOW (ref 3.5–5.2)
Alkaline Phosphatase: 88 U/L (ref 39–117)
BILIRUBIN TOTAL: 0.2 mg/dL — AB (ref 0.3–1.2)
BUN: 13 mg/dL (ref 6–23)
CHLORIDE: 102 meq/L (ref 96–112)
CO2: 26 meq/L (ref 19–32)
CREATININE: 1.02 mg/dL (ref 0.50–1.10)
Calcium: 9.3 mg/dL (ref 8.4–10.5)
GFR calc Af Amer: 87 mL/min — ABNORMAL LOW (ref >90)
GFR, EST NON AFRICAN AMERICAN: 75 mL/min — AB (ref >90)
Glucose, Bld: 87 mg/dL (ref 70–99)
Potassium: 3.7 mEq/L (ref 3.7–5.3)
Sodium: 140 mEq/L (ref 137–147)
Total Protein: 8 g/dL (ref 6.0–8.3)

## 2014-07-15 LAB — CBC WITH DIFFERENTIAL/PLATELET
BASOS ABS: 0 10*3/uL (ref 0.0–0.1)
BASOS PCT: 0 % (ref 0–1)
EOS ABS: 0.2 10*3/uL (ref 0.0–0.7)
Eosinophils Relative: 2 % (ref 0–5)
HCT: 32.3 % — ABNORMAL LOW (ref 36.0–46.0)
HEMOGLOBIN: 10.5 g/dL — AB (ref 12.0–15.0)
Lymphocytes Relative: 22 % (ref 12–46)
Lymphs Abs: 2.6 10*3/uL (ref 0.7–4.0)
MCH: 24 pg — AB (ref 26.0–34.0)
MCHC: 32.5 g/dL (ref 30.0–36.0)
MCV: 73.9 fL — ABNORMAL LOW (ref 78.0–100.0)
MONO ABS: 0.8 10*3/uL (ref 0.1–1.0)
Monocytes Relative: 7 % (ref 3–12)
NEUTROS PCT: 69 % (ref 43–77)
Neutro Abs: 8.2 10*3/uL — ABNORMAL HIGH (ref 1.7–7.7)
Platelets: ADEQUATE 10*3/uL (ref 150–400)
RBC: 4.37 MIL/uL (ref 3.87–5.11)
RDW: 15.7 % — AB (ref 11.5–15.5)
Smear Review: ADEQUATE
WBC: 11.8 10*3/uL — ABNORMAL HIGH (ref 4.0–10.5)

## 2014-07-15 LAB — URINE MICROSCOPIC-ADD ON

## 2014-07-15 LAB — LIPASE, BLOOD: LIPASE: 24 U/L (ref 11–59)

## 2014-07-15 LAB — WET PREP, GENITAL
Trich, Wet Prep: NONE SEEN
Yeast Wet Prep HPF POC: NONE SEEN

## 2014-07-15 LAB — PREGNANCY, URINE: Preg Test, Ur: NEGATIVE

## 2014-07-15 MED ORDER — DEXTROSE 5 % IV SOLN
1.0000 g | Freq: Once | INTRAVENOUS | Status: AC
  Administered 2014-07-15: 1 g via INTRAVENOUS
  Filled 2014-07-15: qty 10

## 2014-07-15 MED ORDER — ONDANSETRON HCL 4 MG PO TABS: 4.0000 mg | ORAL_TABLET | Freq: Four times a day (QID) | ORAL | Status: DC

## 2014-07-15 MED ORDER — MORPHINE SULFATE 4 MG/ML IJ SOLN
4.0000 mg | Freq: Once | INTRAMUSCULAR | Status: AC
  Administered 2014-07-15: 4 mg via INTRAVENOUS
  Filled 2014-07-15: qty 1

## 2014-07-15 MED ORDER — ONDANSETRON HCL 4 MG/2ML IJ SOLN
4.0000 mg | Freq: Once | INTRAMUSCULAR | Status: AC
  Administered 2014-07-15: 4 mg via INTRAVENOUS
  Filled 2014-07-15: qty 2

## 2014-07-15 MED ORDER — CEPHALEXIN 500 MG PO CAPS: 500.0000 mg | ORAL_CAPSULE | Freq: Four times a day (QID) | ORAL | Status: DC

## 2014-07-15 NOTE — ED Provider Notes (Signed)
CSN: [id number]     Arrival date & time 07/15/14  1912 History  This chart was scribed for Robin OctaveStephen Kiowa Peifer, MD by Murriel HopperAlec Bankhead, ED Scribe. This patient was seen in room APA15/APA15 and the patient's care was started at 7:56 PM.    Chief Complaint  Patient presents with  . Dysuria    The history is provided by the patient. No language interpreter was used.     HPI Comments: Robin Perez is a 28 y.o. female who presents to the Emergency Department complaining of constant, worsening abdominal pain with associated lower back pain, hematuria, and dysuria that has been present since yesterday. Pt has had tubal ligation in the past. Pt denies vaginal bleeding, vaginal discharge, vomiting, or diarrhea. Pt also reports having a sinus congestion with an associated cough that has been present for two to three weeks.     Past Medical History  Diagnosis Date  . Vertigo   . Asthma   . Incisional pain 05/09/2013    Will rx cipro   Past Surgical History  Procedure Laterality Date  . Cesarean section    . Cesarean section    . Cesarean section with bilateral tubal ligation N/A 04/23/2013    Procedure: CESAREAN SECTION WITH BILATERAL TUBAL LIGATION;  Surgeon: Robin Rosenthalarolyn Harraway-Smith, MD;  Location: [facility];  Service: Obstetrics;  Laterality: N/A;   History reviewed. No pertinent family history. History  Substance Use Topics  . Smoking status: Current Every Day Smoker -- 0.50 packs/day    Types: Cigarettes  . Smokeless tobacco: Never Used  . Alcohol Use: No   OB History    Gravida Para Term Preterm AB TAB SAB Ectopic Multiple Living   3 3 3       3      Review of Systems  HENT: Positive for congestion.   Respiratory: Positive for cough.   Gastrointestinal: Positive for abdominal pain. Negative for vomiting and diarrhea.  Genitourinary: Positive for dysuria, hematuria and difficulty urinating. Negative for vaginal bleeding and vaginal discharge.  Musculoskeletal: Positive for back pain.     A complete 10 system review of systems was obtained and all systems are negative except as noted in the HPI and PMH.    Allergies  Review of patient's allergies indicates no known allergies.  Home Medications   Prior to Admission medications   Medication Sig Start Date End Date Taking? Authorizing Provider  cephALEXin (KEFLEX) 500 MG capsule Take 1 capsule (500 mg total) by mouth 4 (four) times daily. 07/15/14   Robin OctaveStephen Keishawna Carranza, MD  hydroxypropyl methylcellulose (ISOPTO TEARS) 2.5 % ophthalmic solution Place 2 drops into both eyes 3 (three) times daily. Patient not taking: Reported on 07/15/2014 03/05/14   Kristen N Ward, DO  ondansetron (ZOFRAN) 4 MG tablet Take 1 tablet (4 mg total) by mouth every 6 (six) hours. 07/15/14   Robin OctaveStephen Annabelle Rexroad, MD   BP 151/106 mmHg  Pulse 89  Temp(Src) 96.8 F (36 C) (Oral)  Resp 20  Ht 5\' 8"  (1.727 m)  Wt 222 lb (100.699 kg)  BMI 33.76 kg/m2  SpO2 98%  LMP 07/07/2014 Physical Exam  Constitutional: She is oriented to person, place, and time. She appears well-developed and well-nourished. No distress.  Congested voice  HENT:  Head: Normocephalic and atraumatic.  Mouth/Throat: Oropharynx is clear and moist. No oropharyngeal exudate.  Eyes: Conjunctivae and EOM are normal. Pupils are equal, round, and reactive to light.  Neck: Normal range of motion. Neck supple.  No meningismus.  Cardiovascular:  Normal rate, regular rhythm, normal heart sounds and intact distal pulses.   No murmur heard. Pulmonary/Chest: Effort normal and breath sounds normal. No respiratory distress.  Abdominal: Soft. There is no tenderness. There is no rebound and no guarding.  Diffuse abdominal pain Worse suprapubic No CVA tenderness   Genitourinary:  Chaperone present. Normal external genitalia. Scant white vaginal discharge. No CMT. No adnexal tenderness.  Musculoskeletal: Normal range of motion. She exhibits no edema or tenderness.  Right lower leg swelling greater  than left (chronic per patient) Intact DP and PT pulses  Neurological: She is alert and oriented to person, place, and time. No cranial nerve deficit. She exhibits normal muscle tone. Coordination normal.  No ataxia on finger to nose bilaterally. No pronator drift. 5/5 strength throughout. CN 2-12 intact. Negative Romberg. Equal grip strength. Sensation intact. Gait is normal.   Skin: Skin is warm.  Psychiatric: She has a normal mood and affect. Her behavior is normal.  Nursing note and vitals reviewed.   ED Course  Procedures (including critical care time)  DIAGNOSTIC STUDIES: Oxygen Saturation is 98% on RA, normal by my interpretation.    COORDINATION OF CARE: 8:00 PM Discussed treatment plan with pt at bedside and pt agreed to plan.   Labs Review Labs Reviewed  WET PREP, GENITAL - Abnormal; Notable for the following:    Clue Cells Wet Prep HPF POC FEW (*)    WBC, Wet Prep HPF POC FEW (*)    All other components within normal limits  URINALYSIS, ROUTINE W REFLEX MICROSCOPIC - Abnormal; Notable for the following:    APPearance HAZY (*)    Hgb urine dipstick LARGE (*)    Protein, ur 100 (*)    Nitrite POSITIVE (*)    Leukocytes, UA TRACE (*)    All other components within normal limits  CBC WITH DIFFERENTIAL - Abnormal; Notable for the following:    WBC 11.8 (*)    Hemoglobin 10.5 (*)    HCT 32.3 (*)    MCV 73.9 (*)    MCH 24.0 (*)    RDW 15.7 (*)    Neutro Abs 8.2 (*)    All other components within normal limits  COMPREHENSIVE METABOLIC PANEL - Abnormal; Notable for the following:    Albumin 3.4 (*)    Total Bilirubin 0.2 (*)    GFR calc non Af Amer 75 (*)    GFR calc Af Amer 87 (*)    All other components within normal limits  URINE MICROSCOPIC-ADD ON - Abnormal; Notable for the following:    Squamous Epithelial / LPF FEW (*)    Bacteria, UA MANY (*)    All other components within normal limits  GC/CHLAMYDIA PROBE AMP  PREGNANCY, URINE  LIPASE, BLOOD     Imaging Review Ct Renal Stone Study  07/15/2014   CLINICAL DATA:  Right lower quadrant pain, dysuria since yesterday  EXAM: CT ABDOMEN AND PELVIS WITHOUT CONTRAST  TECHNIQUE: Multidetector CT imaging of the abdomen and pelvis was performed following the standard protocol without IV contrast.  COMPARISON:  04/29/2012  FINDINGS: Sagittal images of the spine are unremarkable. The lung bases are unremarkable. Unenhanced liver shows no biliary ductal dilatation. No calcified gallstones are noted within gallbladder. Small umbilical hernia containing fat without evidence of acute complication.  Kidneys are symmetrical in size. No nephrolithiasis. No hydronephrosis or hydroureter. No calcified ureteral calculi.  There is abundant stool in right colon and transverse colon. No pericecal inflammation. Moderate stool within distal  sigmoid colon. Distal sigmoid colon measures 5.7 cm in diameter.  No pericecal inflammation.  The appendix is not identified.  Unenhanced uterus is unremarkable. Bilateral tubal ligation surgical clips are noted. Nonspecific mild thickening of urinary bladder wall. Clinical correlation is necessary to exclude cystitis. Bilateral distal ureter is unremarkable. No calcified calculi are noted within urinary bladder.  IMPRESSION: 1. No nephrolithiasis.  No hydronephrosis or hydroureter. 2. Tiny umbilical hernia containing omental fat without evidence acute complication. 3. No pericecal inflammation.  The appendix is not identified. 4. Moderate stool in right colon, transverse colon and distal sigmoid colon. 5. Bilateral tubal ligation surgical clips are noted. 6. Mild thickening of urinary bladder wall. Clinical correlation is necessary is exclude cystitis.   Electronically Signed   By: Natasha MeadLiviu  Pop M.D.   On: 07/15/2014 22:41     EKG Interpretation None      MDM   Final diagnoses:  Abdominal pain  Urinary tract infection without hematuria, site unspecified   2 day history of lower  abdominal pain with dysuria and hematuria. Chronic right leg swelling. No chest pain or shortness of breath.  Urinalysis appears infected. Pelvic exam benign. No pain at McBurney's point. HCG negative. Rocephin given.  Treat UTI with keflex.  Will check CT to exclude infected stone.  Pending at time of sign out to Dr. Wilkie AyeHorton.  Patient to return tomorrow for doppler of RLE.  No chest pain or SOB. Chronic issue, so will not dose lovenox at this time.  I personally performed the services described in this documentation, which was scribed in my presence. The recorded information has been reviewed and is accurate.   Robin OctaveStephen Cesar Rogerson, MD 07/15/14 [phone]

## 2014-07-15 NOTE — ED Provider Notes (Signed)
SIgned out by Dr. Manus Gunningancour pending CT.  CT without evidence of renal stone. Patient will be discharged with antibiotics for UTI per Dr. Manus Gunningancour.  Results for orders placed or performed during the hospital encounter of 07/15/14  Wet prep, genital  Result Value Ref Range   Yeast Wet Prep HPF POC NONE SEEN NONE SEEN   Trich, Wet Prep NONE SEEN NONE SEEN   Clue Cells Wet Prep HPF POC FEW (A) NONE SEEN   WBC, Wet Prep HPF POC FEW (A) NONE SEEN  Urinalysis, Routine w reflex microscopic  Result Value Ref Range   Color, Urine YELLOW YELLOW   APPearance HAZY (A) CLEAR   Specific Gravity, Urine 1.025 1.005 - 1.030   pH 6.0 5.0 - 8.0   Glucose, UA NEGATIVE NEGATIVE mg/dL   Hgb urine dipstick LARGE (A) NEGATIVE   Bilirubin Urine NEGATIVE NEGATIVE   Ketones, ur NEGATIVE NEGATIVE mg/dL   Protein, ur 409100 (A) NEGATIVE mg/dL   Urobilinogen, UA 0.2 0.0 - 1.0 mg/dL   Nitrite POSITIVE (A) NEGATIVE   Leukocytes, UA TRACE (A) NEGATIVE  Pregnancy, urine  Result Value Ref Range   Preg Test, Ur NEGATIVE NEGATIVE  CBC with Differential  Result Value Ref Range   WBC 11.8 (H) 4.0 - 10.5 K/uL   RBC 4.37 3.87 - 5.11 MIL/uL   Hemoglobin 10.5 (L) 12.0 - 15.0 g/dL   HCT 81.132.3 (L) 91.436.0 - 78.246.0 %   MCV 73.9 (L) 78.0 - 100.0 fL   MCH 24.0 (L) 26.0 - 34.0 pg   MCHC 32.5 30.0 - 36.0 g/dL   RDW 95.615.7 (H) 21.311.5 - 08.615.5 %   Platelets  150 - 400 K/uL    PLATELET CLUMPS NOTED ON SMEAR, COUNT APPEARS ADEQUATE   Neutrophils Relative % 69 43 - 77 %   Lymphocytes Relative 22 12 - 46 %   Monocytes Relative 7 3 - 12 %   Eosinophils Relative 2 0 - 5 %   Basophils Relative 0 0 - 1 %   Neutro Abs 8.2 (H) 1.7 - 7.7 K/uL   Lymphs Abs 2.6 0.7 - 4.0 K/uL   Monocytes Absolute 0.8 0.1 - 1.0 K/uL   Eosinophils Absolute 0.2 0.0 - 0.7 K/uL   Basophils Absolute 0.0 0.0 - 0.1 K/uL   RBC Morphology ELLIPTOCYTES    WBC Morphology WHITE COUNT CONFIRMED ON SMEAR    Smear Review      PLATELET CLUMPS NOTED ON SMEAR, COUNT APPEARS  ADEQUATE  Comprehensive metabolic panel  Result Value Ref Range   Sodium 140 137 - 147 mEq/L   Potassium 3.7 3.7 - 5.3 mEq/L   Chloride 102 96 - 112 mEq/L   CO2 26 19 - 32 mEq/L   Glucose, Bld 87 70 - 99 mg/dL   BUN 13 6 - 23 mg/dL   Creatinine, Ser 5.781.02 0.50 - 1.10 mg/dL   Calcium 9.3 8.4 - 46.910.5 mg/dL   Total Protein 8.0 6.0 - 8.3 g/dL   Albumin 3.4 (L) 3.5 - 5.2 g/dL   AST 12 0 - 37 U/L   ALT 6 0 - 35 U/L   Alkaline Phosphatase 88 39 - 117 U/L   Total Bilirubin 0.2 (L) 0.3 - 1.2 mg/dL   GFR calc non Af Amer 75 (L) >90 mL/min   GFR calc Af Amer 87 (L) >90 mL/min   Anion gap 12 5 - 15  Lipase, blood  Result Value Ref Range   Lipase 24 11 - 59 U/L  Urine microscopic-add on  Result Value Ref Range   Squamous Epithelial / LPF FEW (A) RARE   WBC, UA 7-10 <3 WBC/hpf   RBC / HPF 21-50 <3 RBC/hpf   Bacteria, UA MANY (A) RARE   Ct Renal Stone Study  07/15/2014   CLINICAL DATA:  Right lower quadrant pain, dysuria since yesterday  EXAM: CT ABDOMEN AND PELVIS WITHOUT CONTRAST  TECHNIQUE: Multidetector CT imaging of the abdomen and pelvis was performed following the standard protocol without IV contrast.  COMPARISON:  04/29/2012  FINDINGS: Sagittal images of the spine are unremarkable. The lung bases are unremarkable. Unenhanced liver shows no biliary ductal dilatation. No calcified gallstones are noted within gallbladder. Small umbilical hernia containing fat without evidence of acute complication.  Kidneys are symmetrical in size. No nephrolithiasis. No hydronephrosis or hydroureter. No calcified ureteral calculi.  There is abundant stool in right colon and transverse colon. No pericecal inflammation. Moderate stool within distal sigmoid colon. Distal sigmoid colon measures 5.7 cm in diameter.  No pericecal inflammation.  The appendix is not identified.  Unenhanced uterus is unremarkable. Bilateral tubal ligation surgical clips are noted. Nonspecific mild thickening of urinary bladder wall.  Clinical correlation is necessary to exclude cystitis. Bilateral distal ureter is unremarkable. No calcified calculi are noted within urinary bladder.  IMPRESSION: 1. No nephrolithiasis.  No hydronephrosis or hydroureter. 2. Tiny umbilical hernia containing omental fat without evidence acute complication. 3. No pericecal inflammation.  The appendix is not identified. 4. Moderate stool in right colon, transverse colon and distal sigmoid colon. 5. Bilateral tubal ligation surgical clips are noted. 6. Mild thickening of urinary bladder wall. Clinical correlation is necessary is exclude cystitis.   Electronically Signed   By: Natasha MeadLiviu  Pop M.D.   On: 07/15/2014 22:41      Shon Batonourtney F Horton, MD 07/15/14 [phone]

## 2014-07-15 NOTE — Discharge Instructions (Signed)
Urinary Tract Infection Take the antibiotics as prescribed. Return tomorrow for an ultrasound of your leg to rule out a blood clot. Follow up with your doctor. Return to the ED if you develop new or worsening symptoms. Urinary tract infections (UTIs) can develop anywhere along your urinary tract. Your urinary tract is your body's drainage system for removing wastes and extra water. Your urinary tract includes two kidneys, two ureters, a bladder, and a urethra. Your kidneys are a pair of bean-shaped organs. Each kidney is about the size of your fist. They are located below your ribs, one on each side of your spine. CAUSES Infections are caused by microbes, which are microscopic organisms, including fungi, viruses, and bacteria. These organisms are so small that they can only be seen through a microscope. Bacteria are the microbes that most commonly cause UTIs. SYMPTOMS  Symptoms of UTIs may vary by age and gender of the patient and by the location of the infection. Symptoms in young women typically include a frequent and intense urge to urinate and a painful, burning feeling in the bladder or urethra during urination. Older women and men are more likely to be tired, shaky, and weak and have muscle aches and abdominal pain. A fever may mean the infection is in your kidneys. Other symptoms of a kidney infection include pain in your back or sides below the ribs, nausea, and vomiting. DIAGNOSIS To diagnose a UTI, your caregiver will ask you about your symptoms. Your caregiver also will ask to provide a urine sample. The urine sample will be tested for bacteria and white blood cells. White blood cells are made by your body to help fight infection. TREATMENT  Typically, UTIs can be treated with medication. Because most UTIs are caused by a bacterial infection, they usually can be treated with the use of antibiotics. The choice of antibiotic and length of treatment depend on your symptoms and the type of bacteria  causing your infection. HOME CARE INSTRUCTIONS  If you were prescribed antibiotics, take them exactly as your caregiver instructs you. Finish the medication even if you feel better after you have only taken some of the medication.  Drink enough water and fluids to keep your urine clear or pale yellow.  Avoid caffeine, tea, and carbonated beverages. They tend to irritate your bladder.  Empty your bladder often. Avoid holding urine for long periods of time.  Empty your bladder before and after sexual intercourse.  After a bowel movement, women should cleanse from front to back. Use each tissue only once. SEEK MEDICAL CARE IF:   You have back pain.  You develop a fever.  Your symptoms do not begin to resolve within 3 days. SEEK IMMEDIATE MEDICAL CARE IF:   You have severe back pain or lower abdominal pain.  You develop chills.  You have nausea or vomiting.  You have continued burning or discomfort with urination. MAKE SURE YOU:   Understand these instructions.  Will watch your condition.  Will get help right away if you are not doing well or get worse. Document Released: 05/18/2005 Document Revised: 02/07/2012 Document Reviewed: 09/16/2011 [facility] Patient Information 2015 [place]. This information is not intended to replace advice given to you by your health care provider. Make sure you discuss any questions you have with your health care provider.

## 2014-07-15 NOTE — ED Notes (Signed)
Patient states lower abdominal pain for a few days. Patient states painful urination with hematuria that started today. Patient also states right lower leg pain and swelling. Per patient leg has been swollen for over a year.

## 2014-07-15 NOTE — ED Notes (Signed)
Patient discharged to waiting room to wait for ride home. Patient verbalizes understanding of discharge instructions, home care, follow up appointment and prescription medications. Patient instructed not to drive d/t pain medications. Patient verbalized that she will wait for ride in the waiting room. Patient ambulatory out of department at this time.

## 2014-07-15 NOTE — ED Notes (Signed)
Pt c/o lower abd pressure and dysuria since yesterday.

## 2014-07-16 ENCOUNTER — Other Ambulatory Visit (HOSPITAL_COMMUNITY): Payer: Self-pay | Admitting: Emergency Medicine

## 2014-07-16 ENCOUNTER — Ambulatory Visit (HOSPITAL_COMMUNITY): Admit: 2014-07-16 | Payer: Medicaid Other

## 2014-07-16 DIAGNOSIS — M7989 Other specified soft tissue disorders: Secondary | ICD-10-CM

## 2014-07-17 LAB — GC/CHLAMYDIA PROBE AMP
CT PROBE, AMP APTIMA: NEGATIVE
GC PROBE AMP APTIMA: NEGATIVE

## 2015-05-08 ENCOUNTER — Encounter (HOSPITAL_COMMUNITY): Payer: Self-pay | Admitting: *Deleted

## 2015-05-08 ENCOUNTER — Emergency Department (HOSPITAL_COMMUNITY)
Admission: EM | Admit: 2015-05-08 | Discharge: 2015-05-08 | Disposition: A | Discharge status: Home / Self Care | Payer: Medicaid Other | Attending: Physician Assistant | Admitting: Physician Assistant

## 2015-05-08 ENCOUNTER — Emergency Department (HOSPITAL_COMMUNITY): Payer: Medicaid Other

## 2015-05-08 DIAGNOSIS — J45909 Unspecified asthma, uncomplicated: Secondary | ICD-10-CM | POA: Insufficient documentation

## 2015-05-08 DIAGNOSIS — K088 Other specified disorders of teeth and supporting structures: Secondary | ICD-10-CM | POA: Insufficient documentation

## 2015-05-08 DIAGNOSIS — S0993XA Unspecified injury of face, initial encounter: Secondary | ICD-10-CM

## 2015-05-08 DIAGNOSIS — S01511A Laceration without foreign body of lip, initial encounter: Secondary | ICD-10-CM | POA: Insufficient documentation

## 2015-05-08 DIAGNOSIS — R030 Elevated blood-pressure reading, without diagnosis of hypertension: Secondary | ICD-10-CM | POA: Insufficient documentation

## 2015-05-08 DIAGNOSIS — Y9389 Activity, other specified: Secondary | ICD-10-CM | POA: Insufficient documentation

## 2015-05-08 DIAGNOSIS — S0181XA Laceration without foreign body of other part of head, initial encounter: Secondary | ICD-10-CM | POA: Insufficient documentation

## 2015-05-08 DIAGNOSIS — Z23 Encounter for immunization: Secondary | ICD-10-CM | POA: Insufficient documentation

## 2015-05-08 DIAGNOSIS — R000 Tachycardia, unspecified: Secondary | ICD-10-CM | POA: Insufficient documentation

## 2015-05-08 DIAGNOSIS — Z720 Tobacco use: Secondary | ICD-10-CM | POA: Insufficient documentation

## 2015-05-08 DIAGNOSIS — Y9289 Other specified places as the place of occurrence of the external cause: Secondary | ICD-10-CM | POA: Insufficient documentation

## 2015-05-08 DIAGNOSIS — F419 Anxiety disorder, unspecified: Secondary | ICD-10-CM | POA: Insufficient documentation

## 2015-05-08 DIAGNOSIS — Y998 Other external cause status: Secondary | ICD-10-CM | POA: Insufficient documentation

## 2015-05-08 LAB — CBC WITH DIFFERENTIAL/PLATELET
Basophils Absolute: 0 10*3/uL (ref 0.0–0.1)
Basophils Relative: 0 %
EOS PCT: 4 %
Eosinophils Absolute: 0.4 10*3/uL (ref 0.0–0.7)
HCT: 28.3 % — ABNORMAL LOW (ref 36.0–46.0)
Hemoglobin: 9 g/dL — ABNORMAL LOW (ref 12.0–15.0)
LYMPHS ABS: 2.2 10*3/uL (ref 0.7–4.0)
Lymphocytes Relative: 20 %
MCH: 23 pg — ABNORMAL LOW (ref 26.0–34.0)
MCHC: 31.8 g/dL (ref 30.0–36.0)
MCV: 72.4 fL — AB (ref 78.0–100.0)
MONOS PCT: 8 %
Monocytes Absolute: 0.9 10*3/uL (ref 0.1–1.0)
Neutro Abs: 7.5 10*3/uL (ref 1.7–7.7)
Neutrophils Relative %: 68 %
Platelets: 223 10*3/uL (ref 150–400)
RBC: 3.91 MIL/uL (ref 3.87–5.11)
RDW: 16.9 % — ABNORMAL HIGH (ref 11.5–15.5)
WBC: 10.9 10*3/uL — AB (ref 4.0–10.5)

## 2015-05-08 LAB — BASIC METABOLIC PANEL
Anion gap: 7 (ref 5–15)
BUN: 12 mg/dL (ref 6–20)
CHLORIDE: 108 mmol/L (ref 101–111)
CO2: 22 mmol/L (ref 22–32)
Calcium: 8.2 mg/dL — ABNORMAL LOW (ref 8.9–10.3)
Creatinine, Ser: 0.96 mg/dL (ref 0.44–1.00)
GFR calc Af Amer: >60 mL/min (ref >60)
GFR calc non Af Amer: >60 mL/min (ref >60)
Glucose, Bld: 106 mg/dL — ABNORMAL HIGH (ref 65–99)
POTASSIUM: 3.3 mmol/L — AB (ref 3.5–5.1)
Sodium: 137 mmol/L (ref 135–145)

## 2015-05-08 MED ORDER — ONDANSETRON HCL 4 MG/2ML IJ SOLN
INTRAMUSCULAR | Status: AC
  Filled 2015-05-08: qty 2

## 2015-05-08 MED ORDER — OXYCODONE-ACETAMINOPHEN 5-325 MG PO TABS: 1.0000 | ORAL_TABLET | Freq: Four times a day (QID) | ORAL | Status: DC | PRN

## 2015-05-08 MED ORDER — OXYCODONE-ACETAMINOPHEN 5-325 MG PO TABS
1.0000 | ORAL_TABLET | Freq: Once | ORAL | Status: AC
  Administered 2015-05-08: 1 via ORAL

## 2015-05-08 MED ORDER — LIDOCAINE-EPINEPHRINE (PF) 2 %-1:200000 IJ SOLN
INTRAMUSCULAR | Status: AC
  Filled 2015-05-08: qty 20

## 2015-05-08 MED ORDER — "THROMBI-PAD 3""X3"" EX PADS"
MEDICATED_PAD | CUTANEOUS | Status: AC
  Filled 2015-05-08: qty 1

## 2015-05-08 MED ORDER — SODIUM CHLORIDE 0.9 % IV BOLUS (SEPSIS)
1000.0000 mL | Freq: Once | INTRAVENOUS | Status: AC
  Administered 2015-05-08: 1000 mL via INTRAVENOUS

## 2015-05-08 MED ORDER — ONDANSETRON HCL 4 MG/2ML IJ SOLN
4.0000 mg | Freq: Once | INTRAMUSCULAR | Status: AC
  Administered 2015-05-08: 4 mg via INTRAVENOUS
  Filled 2015-05-08: qty 2

## 2015-05-08 MED ORDER — OXYCODONE-ACETAMINOPHEN 5-325 MG PO TABS
ORAL_TABLET | ORAL | Status: AC
  Filled 2015-05-08: qty 1

## 2015-05-08 MED ORDER — OXYCODONE-ACETAMINOPHEN 5-325 MG PO TABS: 2.0000 | ORAL_TABLET | ORAL | Status: DC | PRN

## 2015-05-08 MED ORDER — MORPHINE SULFATE (PF) 4 MG/ML IV SOLN
4.0000 mg | Freq: Once | INTRAVENOUS | Status: AC
  Administered 2015-05-08: 4 mg via INTRAVENOUS
  Filled 2015-05-08: qty 1

## 2015-05-08 MED ORDER — TETANUS-DIPHTH-ACELL PERTUSSIS 5-2.5-18.5 LF-MCG/0.5 IM SUSP
0.5000 mL | Freq: Once | INTRAMUSCULAR | Status: AC
  Administered 2015-05-08: 0.5 mL via INTRAMUSCULAR
  Filled 2015-05-08: qty 0.5
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
## 2015-05-08 NOTE — ED Notes (Signed)
Pt alert & oriented x4, stable gait. Patient given discharge instructions, paperwork & prescription(s). Patient informed not to drive, operate any equipment & handel any important documents 4 hours after taking pain medication. Patient  instructed to stop at the registration desk to finish any additional paperwork. Patient  verbalized understanding. Pt left department w/ no further questions. 
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
## 2015-05-08 NOTE — ED Notes (Signed)
Pt gave permission for RN to speak with [employer] reference her results,
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
## 2015-05-08 NOTE — ED Notes (Signed)
Spoke with [employer] [facility] regarding patients results, informed them that patient does have a fracture of the right nasal bone

## 2015-05-11 ENCOUNTER — Encounter (HOSPITAL_COMMUNITY): Payer: Self-pay

## 2015-05-11 ENCOUNTER — Emergency Department (HOSPITAL_COMMUNITY)
Admission: EM | Admit: 2015-05-11 | Discharge: 2015-05-11 | Disposition: A | Discharge status: Home / Self Care | Payer: Medicaid Other | Attending: Emergency Medicine | Admitting: Emergency Medicine

## 2015-05-11 DIAGNOSIS — S01511D Laceration without foreign body of lip, subsequent encounter: Secondary | ICD-10-CM | POA: Insufficient documentation

## 2015-05-11 DIAGNOSIS — S0993XD Unspecified injury of face, subsequent encounter: Secondary | ICD-10-CM | POA: Insufficient documentation

## 2015-05-11 DIAGNOSIS — Z87891 Personal history of nicotine dependence: Secondary | ICD-10-CM | POA: Insufficient documentation

## 2015-05-11 DIAGNOSIS — J45909 Unspecified asthma, uncomplicated: Secondary | ICD-10-CM | POA: Insufficient documentation

## 2015-05-11 DIAGNOSIS — Z760 Encounter for issue of repeat prescription: Secondary | ICD-10-CM | POA: Insufficient documentation

## 2015-05-11 MED ORDER — NAPROXEN 500 MG PO TABS: 500.0000 mg | ORAL_TABLET | Freq: Two times a day (BID) | ORAL | Status: DC

## 2015-05-11 MED ORDER — OXYCODONE-ACETAMINOPHEN 5-325 MG PO TABS: 1.0000 | ORAL_TABLET | Freq: Four times a day (QID) | ORAL | Status: DC | PRN
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
## 2015-05-11 NOTE — Discharge Instructions (Signed)
Contusion A contusion is a deep bruise. Contusions are the result of an injury that caused bleeding under the skin. The contusion may turn blue, purple, or yellow. Minor injuries will give you a painless contusion, but more severe contusions may stay painful and swollen for a few weeks.  CAUSES  A contusion is usually caused by a blow, trauma, or direct force to an area of the body. SYMPTOMS   Swelling and redness of the injured area.  Bruising of the injured area.  Tenderness and soreness of the injured area.  Pain. DIAGNOSIS  The diagnosis can be made by taking a history and physical exam. An X-ray, CT scan, or MRI may be needed to determine if there were any associated injuries, such as fractures. TREATMENT  Specific treatment will depend on what area of the body was injured. In general, the best treatment for a contusion is resting, icing, elevating, and applying cold compresses to the injured area. Over-the-counter medicines may also be recommended for pain control. Ask your caregiver what the best treatment is for your contusion. HOME CARE INSTRUCTIONS   Put ice on the injured area.  Put ice in a plastic bag.  Place a towel between your skin and the bag.  Leave the ice on for 15-20 minutes, 3-4 times a day, or as directed by your health care provider.  Only take over-the-counter or prescription medicines for pain, discomfort, or fever as directed by your caregiver. Your caregiver may recommend avoiding anti-inflammatory medicines (aspirin, ibuprofen, and naproxen) for 48 hours because these medicines may increase bruising.  Rest the injured area.  If possible, elevate the injured area to reduce swelling. SEEK IMMEDIATE MEDICAL CARE IF:   You have increased bruising or swelling.  You have pain that is getting worse.  Your swelling or pain is not relieved with medicines. MAKE SURE YOU:   Understand these instructions.  Will watch your condition.  Will get help right  away if you are not doing well or get worse. Document Released: 05/18/2005 Document Revised: 08/13/2013 Document Reviewed: 06/13/2011 [facility] Patient Information 2015 [place], [place]. This information is not intended to replace advice given to you by your health care provider. Make sure you discuss any questions you have with your health care provider. Facial Laceration A facial laceration is a cut on the face. These injuries can be painful and cause bleeding. Some cuts may need to be closed with stitches (sutures), skin adhesive strips, or wound glue. Cuts usually heal quickly but can leave a scar. It can take 1-2 years for the scar to go away completely. HOME CARE   Only take medicines as told by your doctor.  Follow your doctor's instructions for wound care. For Stitches:  Keep the cut clean and dry.  If you have a bandage (dressing), change it at least once a day. Change the bandage if it gets wet or dirty, or as told by your doctor.  Wash the cut with soap and water 2 times a day. Rinse the cut with water. Pat it dry with a clean towel.  Put a thin layer of medicated cream on the cut as told by your doctor.  You may shower after the first 24 hours. Do not soak the cut in water until the stitches are removed.  Have your stitches removed as told by your doctor.  Do not wear any makeup until a few days after your stitches are removed. For Skin Adhesive Strips:  Keep the cut clean and dry.  Do not get the strips wet. You may take a bath, but be careful to keep the cut dry. °· If the cut gets wet, pat it dry with a clean towel. °· The strips will fall off on their own. Do not remove the strips that are still stuck to the cut. °For Wound Glue: °· You may shower or take baths. Do not soak or scrub the cut. Do not swim. Avoid heavy sweating until the glue falls off on its own. After a shower or bath, pat the cut dry with a clean towel. °· Do not put medicine or makeup on your cut until  the glue falls off. °· If you have a bandage, do not put tape over the glue. °· Avoid lots of sunlight or tanning lamps until the glue falls off. °· The glue will fall off on its own in 5-10 days. Do not pick at the glue. °After Healing: °Put sunscreen on the cut for the first year to reduce your scar. °GET HELP RIGHT AWAY IF:  °· Your cut area gets red, painful, or puffy (swollen). °· You see a yellowish-white fluid (pus) coming from the cut. °· You have chills or a fever. °MAKE SURE YOU:  °· Understand these instructions. °· Will watch your condition. °· Will get help right away if you are not doing well or get worse. °Document Released: 01/25/2008 Document Revised: 05/29/2013 Document Reviewed: 03/21/2013 °[employer]® Patient Information ©2015 [employer]. This information is not intended to replace advice given to you by your health care provider. Make sure you discuss any questions you have with your health care provider. ° °
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
## 2015-05-19 MED FILL — Oxycodone w/ Acetaminophen Tab 5-325 MG: ORAL | Qty: 6 | Status: AC

## 2015-06-04 ENCOUNTER — Encounter (HOSPITAL_COMMUNITY): Payer: Self-pay | Admitting: *Deleted

## 2015-06-04 ENCOUNTER — Observation Stay (HOSPITAL_COMMUNITY)
Admission: EM | Admit: 2015-06-04 | Discharge: 2015-06-06 | Disposition: A | Discharge status: Home / Self Care | Payer: Medicaid Other | Attending: Internal Medicine | Admitting: Internal Medicine

## 2015-06-04 DIAGNOSIS — Z720 Tobacco use: Secondary | ICD-10-CM | POA: Diagnosis present

## 2015-06-04 DIAGNOSIS — J45909 Unspecified asthma, uncomplicated: Secondary | ICD-10-CM | POA: Insufficient documentation

## 2015-06-04 DIAGNOSIS — R2241 Localized swelling, mass and lump, right lower limb: Secondary | ICD-10-CM | POA: Diagnosis present

## 2015-06-04 DIAGNOSIS — L03115 Cellulitis of right lower limb: Principal | ICD-10-CM | POA: Insufficient documentation

## 2015-06-04 DIAGNOSIS — S0990XA Unspecified injury of head, initial encounter: Secondary | ICD-10-CM

## 2015-06-04 DIAGNOSIS — D509 Iron deficiency anemia, unspecified: Secondary | ICD-10-CM | POA: Diagnosis present

## 2015-06-04 DIAGNOSIS — L0390 Cellulitis, unspecified: Secondary | ICD-10-CM

## 2015-06-04 DIAGNOSIS — R319 Hematuria, unspecified: Secondary | ICD-10-CM | POA: Diagnosis not present

## 2015-06-04 DIAGNOSIS — L02419 Cutaneous abscess of limb, unspecified: Secondary | ICD-10-CM | POA: Diagnosis present

## 2015-06-04 DIAGNOSIS — R0981 Nasal congestion: Secondary | ICD-10-CM | POA: Insufficient documentation

## 2015-06-04 DIAGNOSIS — J328 Other chronic sinusitis: Secondary | ICD-10-CM

## 2015-06-04 DIAGNOSIS — L03119 Cellulitis of unspecified part of limb: Secondary | ICD-10-CM

## 2015-06-04 DIAGNOSIS — J329 Chronic sinusitis, unspecified: Secondary | ICD-10-CM

## 2015-06-04 LAB — COMPREHENSIVE METABOLIC PANEL
ALBUMIN: 3.4 g/dL — AB (ref 3.5–5.0)
ALT: 9 U/L — ABNORMAL LOW (ref 14–54)
ANION GAP: 12 (ref 5–15)
AST: 16 U/L (ref 15–41)
Alkaline Phosphatase: 75 U/L (ref 38–126)
BILIRUBIN TOTAL: 0.2 mg/dL — AB (ref 0.3–1.2)
BUN: 15 mg/dL (ref 6–20)
CO2: 25 mmol/L (ref 22–32)
Calcium: 9.1 mg/dL (ref 8.9–10.3)
Chloride: 101 mmol/L (ref 101–111)
Creatinine, Ser: 1.21 mg/dL — ABNORMAL HIGH (ref 0.44–1.00)
GFR calc Af Amer: >60 mL/min (ref >60)
GFR calc non Af Amer: >60 mL/min (ref >60)
GLUCOSE: 80 mg/dL (ref 65–99)
Potassium: 3.1 mmol/L — ABNORMAL LOW (ref 3.5–5.1)
Sodium: 138 mmol/L (ref 135–145)
TOTAL PROTEIN: 8.3 g/dL — AB (ref 6.5–8.1)

## 2015-06-04 LAB — URINE MICROSCOPIC-ADD ON

## 2015-06-04 LAB — URINALYSIS, ROUTINE W REFLEX MICROSCOPIC
Glucose, UA: NEGATIVE mg/dL
KETONES UR: NEGATIVE mg/dL
Leukocytes, UA: NEGATIVE
NITRITE: NEGATIVE
Protein, ur: >300 mg/dL — AB
Specific Gravity, Urine: 1.025 (ref 1.005–1.030)
Urobilinogen, UA: 0.2 mg/dL (ref 0.0–1.0)
pH: 5.5 (ref 5.0–8.0)

## 2015-06-04 LAB — CBC
HEMATOCRIT: 25.2 % — AB (ref 36.0–46.0)
HEMOGLOBIN: 7.9 g/dL — AB (ref 12.0–15.0)
MCH: 21.6 pg — AB (ref 26.0–34.0)
MCHC: 31.3 g/dL (ref 30.0–36.0)
MCV: 68.9 fL — ABNORMAL LOW (ref 78.0–100.0)
Platelets: 280 10*3/uL (ref 150–400)
RBC: 3.66 MIL/uL — ABNORMAL LOW (ref 3.87–5.11)
RDW: 16.2 % — ABNORMAL HIGH (ref 11.5–15.5)
WBC: 13.1 10*3/uL — ABNORMAL HIGH (ref 4.0–10.5)

## 2015-06-04 LAB — PREGNANCY, URINE: Preg Test, Ur: NEGATIVE

## 2015-06-04 MED ORDER — MORPHINE SULFATE (PF) 4 MG/ML IV SOLN
4.0000 mg | Freq: Once | INTRAVENOUS | Status: AC
  Administered 2015-06-04: 4 mg via INTRAVENOUS
  Filled 2015-06-04: qty 1

## 2015-06-04 MED ORDER — CEFTRIAXONE SODIUM 1 G IJ SOLR
1.0000 g | Freq: Once | INTRAMUSCULAR | Status: AC
  Administered 2015-06-04: 1 g via INTRAVENOUS
  Filled 2015-06-04: qty 10

## 2015-06-04 NOTE — H&P (Signed)
[facility] History and Physical  Robin Perez ZOX:[id number] DOB: February 03, 1986    PCP:  Robynn Pane, MD.   Chief Complaint: Several complaints:  Chronic sinusitis, rash on right lower leg, and hematuria.  HPI: Robin Perez is an 29 y.o. female with hx of tubal ligation after 3 heathy children via C sections, hx of "chronic sinus congestion and sinusitis", asthma, hx of bladder wall thickening seen on abdominal CT scan one year ago, tobacco abuse but no drug or alcohol abuse, presented to the ER tonight as she noted hematuria without dysuria or urgency, along with having a red rash on her right lower extremity for one day.  She has subjective fever and chills, but no SOB, chest pain or abdominal pain.  She denied any vaginal discharge, and had no black, bloody stool, or any evidence of bleeding.  Her menses have been irregular, sometimes heavy.  Evaluation in the ER Included a Hb of 7.9 g per dL, microcytic, not far from her baseline, UA with protein and blood, but no WBC or leukocyte esterace, and her WBC was 13K.  Her Cr is 1.21. Her leg was red and swollen and tender.  Hospitalist was asked to admit her for cellulitis, anemia, and hematuria of unclear etiology.   Rewiew of Systems:  Constitutional: Negative for malaise, fever and chills. No significant weight loss or weight gain Eyes: Negative for eye pain, redness and discharge, diplopia, visual changes, or flashes of light. ENMT: Negative for ear pain, hoarseness, nasal congestion, sinus pressure and sore throat. No headaches; tinnitus, drooling, or problem swallowing. Cardiovascular: Negative for chest pain, palpitations, diaphoresis, dyspnea and peripheral edema. ; No orthopnea, PND Respiratory: Negative for cough, hemoptysis, wheezing and stridor. No pleuritic chestpain. Gastrointestinal: Negative for nausea, vomiting, diarrhea, constipation, abdominal pain, melena, blood in stool, hematemesis, jaundice and rectal bleeding.     Genitourinary: Negative for frequency, dysuria, incontinence,flank pain and hematuria; Musculoskeletal: Negative for back pain and neck pain. Negative for swelling and trauma.;  Skin: . Negative for pruritus, rash, abrasions, bruising and skin lesion.; ulcerations Neuro: Negative for headache, lightheadedness and neck stiffness. Negative for weakness, altered level of consciousness , altered mental status, extremity weakness, burning feet, involuntary movement, seizure and syncope.  Psych: negative for anxiety, depression, insomnia, tearfulness, panic attacks, hallucinations, paranoia, suicidal or homicidal ideation    Past Medical History  Diagnosis Date  . Vertigo   . Asthma   . Incisional pain 05/09/2013    Will rx cipro    Past Surgical History  Procedure Laterality Date  . Cesarean section    . Cesarean section    . Cesarean section with bilateral tubal ligation N/A 04/23/2013    Procedure: CESAREAN SECTION WITH BILATERAL TUBAL LIGATION;  Surgeon: Willodean Rosenthal, MD;  Location: [facility];  Service: Obstetrics;  Laterality: N/A;    Medications:  HOME MEDS: Prior to Admission medications   Medication Sig Start Date End Date Taking? Authorizing Provider  acetaminophen (TYLENOL) 500 MG tablet Take 500 mg by mouth every 6 (six) hours as needed for mild pain.     Historical Provider, MD  naproxen (NAPROSYN) 500 MG tablet Take 1 tablet (500 mg total) by mouth 2 (two) times daily. Patient not taking: Reported on 06/04/2015 05/11/15   Linwood Dibbles, MD  oxyCODONE-acetaminophen (PERCOCET/ROXICET) 5-325 MG per tablet Take 1 tablet by mouth every 6 (six) hours as needed for severe pain. Patient not taking: Reported on 06/04/2015 05/11/15   Linwood Dibbles, MD     Allergies:  No Known Allergies  Social History:   reports that she has been smoking Cigarettes.  She has been smoking about 0.50 packs per day. She has never used smokeless tobacco. She reports that she does not drink alcohol or use  illicit drugs.  Family History: History reviewed. No pertinent family history.   Physical Exam: Filed Vitals:   06/04/15 1747 06/04/15 2113 06/04/15 2334  BP: 142/62 124/80 135/87  Pulse: 98 89 90  Temp: 98.3 F (36.8 C) 99.5 F (37.5 C) 99.1 F (37.3 C)  TempSrc: Oral  Oral  Resp: Height:  (1.702 m)    Weight: 98.431 kg (217 lb)    SpO2: 100% 99% 100%   Blood pressure 135/87, pulse 90, temperature 99.1 F (37.3 C), temperature source Oral, resp. rate 16, height  (1.702 m), weight 98.431 kg (217 lb), last menstrual period 05/23/2015, SpO2 100 %.  GEN:  Pleasant  patient lying in the stretcher in no acute distress; cooperative with exam. slighly obese.  PSYCH:  alert and oriented x4; does not appear anxious or depressed; affect is appropriate. HEENT: Mucous membranes pink and anicteric; PERRLA; EOM intact; no cervical lymphadenopathy nor thyromegaly or carotid bruit; no JVD; There were no stridor. Neck is very supple. Breasts:: Not examined CHEST WALL: No tenderness CHEST: Normal respiration, clear to auscultation bilaterally.  HEART: Regular rate and rhythm.  There are no murmur, rub, or gallops.   BACK: No kyphosis or scoliosis; no CVA tenderness ABDOMEN: soft and non-tender; no masses, no organomegaly, normal abdominal bowel sounds; no pannus; no intertriginous candida. There is no rebound and no distention. Rectal Exam: Not done EXTREMITIES: No bone or joint deformity; age-appropriate arthropathy of the hands and knees; no edema; no ulcerations.  There is no calf tenderness. She has erythema of the right lower extremity with some swelling.  Genitalia: not examined PULSES: 2+ and symmetric SKIN: Normal hydration no rash or ulceration CNS: Cranial nerves 2-12 grossly intact no focal lateralizing neurologic deficit.  Speech is fluent; uvula elevated with phonation, facial symmetry and tongue midline. DTR are normal bilaterally, cerebella exam is intact,  barbinski is negative and strengths are equaled bilaterally.  No sensory loss.   Labs on Admission:  Basic Metabolic Panel:  Recent Labs Lab 06/04/15 1930  NA 138  K 3.1*  CL 101  CO2 25  GLUCOSE 80  BUN 15  CREATININE 1.21*  CALCIUM 9.1   Liver Function Tests:  Recent Labs Lab 06/04/15 1930  AST 16  ALT 9*  ALKPHOS 75  BILITOT 0.2*  PROT 8.3*  ALBUMIN 3.4*   CBC:  Recent Labs Lab 06/04/15 1930  WBC 13.1*  HGB 7.9*  HCT 25.2*  MCV 68.9*  PLT 280    EKG: Independently reviewed.   Assessment/Plan Present on Admission:  . Microcytic anemia . Tobacco abuse . Cellulitis and abscess of lower extremity  PLAN:  Will admit her for cellulitis, and possible sinusitis, with hematuria but no evidence of UTI.  WIll give her Rocephin at 2 grams per day, IV.  I am concerned about a systemic illness giving her proteinuria and hematuria with no evidence of UTI.  This symptoms along with " chronic sinusitis" not responding to antibiotics could represent GPA or Wegerner's granulomatosis.  Will start her on 24 hour urine collection to quantitate her proteinuria.  She will also need to have a cystoscopy, whether inpatient or outpatient, given her hx of bladder wall thickening seen on prior CT, tobacco  abuse and now hematuria (though admittedly, she is quite young).  Will obtain a CXR, and p ANCA, and c ANCA.  For her anemia, I am not sure if its from menorrhagia or iron deficiency anemia.  Will obtain anemia panel, guiac her stool.  She is stable, full code, and [facility]. I did explain to her that follow up once discharged from the hospital is extremely important.  Advice tobacco cessation.   Thank you and Good Day.   Other plans as per orders.  Code Status: FULL Unk LightningODE.    Natilie Krabbenhoft, MD. [facility] Pager [phone] 7pm to 7am.  06/04/2015, 11:45 PM

## 2015-06-04 NOTE — ED Provider Notes (Signed)
CSN: [id number]     Arrival date & time 06/04/15  1733 History   First MD Initiated Contact with Patient 06/04/15 1850     Chief Complaint  Patient presents with  . Hematuria  . Leg Swelling     (Consider location/radiation/quality/duration/timing/severity/associated sxs/prior Treatment) Patient is a 29 y.o. female presenting with hematuria. The history is provided by the patient (Patient complains of right leg swollen painful. Patient also complains of blood in the urine and sinus problems).  Hematuria This is a new problem. The current episode started 2 days ago. The problem occurs constantly. Pertinent negatives include no chest pain, no abdominal pain and no headaches. Nothing aggravates the symptoms. Nothing relieves the symptoms.    Past Medical History  Diagnosis Date  . Vertigo   . Asthma   . Incisional pain 05/09/2013    Will rx cipro   Past Surgical History  Procedure Laterality Date  . Cesarean section    . Cesarean section    . Cesarean section with bilateral tubal ligation N/A 04/23/2013    Procedure: CESAREAN SECTION WITH BILATERAL TUBAL LIGATION;  Surgeon: Willodean Rosenthal, MD;  Location: [facility];  Service: Obstetrics;  Laterality: N/A;   No family history on file. Social History  Substance Use Topics  . Smoking status: Current Every Day Smoker -- 0.50 packs/day    Types: Cigarettes  . Smokeless tobacco: Never Used  . Alcohol Use: No   OB History    Gravida Para Term Preterm AB TAB SAB Ectopic Multiple Living   Review of Systems  Constitutional: Negative for appetite change and fatigue.  HENT: Negative for congestion, ear discharge and sinus pressure.        Sinus congestion  Eyes: Negative for discharge.  Respiratory: Negative for cough.   Cardiovascular: Negative for chest pain.  Gastrointestinal: Negative for abdominal pain and diarrhea.  Genitourinary: Positive for hematuria. Negative for frequency.  Musculoskeletal: Negative  for back pain.       Swelling and painful right lower leg  Skin: Negative for rash.  Neurological: Negative for seizures and headaches.  Psychiatric/Behavioral: Negative for hallucinations.      Allergies  Review of patient's allergies indicates no known allergies.  Home Medications   Prior to Admission medications   Medication Sig Start Date End Date Taking? Authorizing Provider  acetaminophen (TYLENOL) 500 MG tablet Take 500 mg by mouth every 6 (six) hours as needed for mild pain.     Historical Provider, MD  naproxen (NAPROSYN) 500 MG tablet Take 1 tablet (500 mg total) by mouth 2 (two) times daily. Patient not taking: Reported on 06/04/2015 05/11/15   Linwood Dibbles, MD  oxyCODONE-acetaminophen (PERCOCET/ROXICET) 5-325 MG per tablet Take 1 tablet by mouth every 6 (six) hours as needed for severe pain. Patient not taking: Reported on 06/04/2015 05/11/15   Linwood Dibbles, MD   BP 124/80 mmHg  Pulse 89  Temp(Src) 99.5 F (37.5 C) (Oral)  Resp 16  Ht  (1.702 m)  Wt 217 lb (98.431 kg)  BMI 33.98 kg/m2  SpO2 99%  LMP 05/23/2015 Physical Exam  Constitutional: She is oriented to person, place, and time. She appears well-developed.  HENT:  Head: Normocephalic.  Eyes: Conjunctivae and EOM are normal. No scleral icterus.  Neck: Neck supple. No thyromegaly present.  Cardiovascular: Normal rate and regular rhythm.  Exam reveals no gallop and no friction rub.   No murmur heard.  Pulmonary/Chest: No stridor. She has no wheezes. She has no rales. She exhibits no tenderness.  Abdominal: She exhibits no distension. There is no tenderness. There is no rebound.  Musculoskeletal: Normal range of motion. She exhibits no edema.  Patient has swelling tenderness and redness to right lower leg consistent with cellulitis  Lymphadenopathy:    She has no cervical adenopathy.  Neurological: She is oriented to person, place, and time. She exhibits normal muscle tone. Coordination normal.  Skin: No rash  noted. There is erythema.  Psychiatric: She has a normal mood and affect. Her behavior is normal.    ED Course  Procedures (including critical care time) Labs Review Labs Reviewed  CBC - Abnormal; Notable for the following:    WBC 13.1 (*)    RBC 3.66 (*)    Hemoglobin 7.9 (*)    HCT 25.2 (*)    MCV 68.9 (*)    MCH 21.6 (*)    RDW 16.2 (*)    All other components within normal limits  COMPREHENSIVE METABOLIC PANEL - Abnormal; Notable for the following:    Potassium 3.1 (*)    Creatinine, Ser 1.21 (*)    Total Protein 8.3 (*)    Albumin 3.4 (*)    ALT 9 (*)    Total Bilirubin 0.2 (*)    All other components within normal limits  URINALYSIS, ROUTINE W REFLEX MICROSCOPIC (NOT AT [facility]) - Abnormal; Notable for the following:    Color, Urine AMBER (*)    APPearance HAZY (*)    Hgb urine dipstick LARGE (*)    Bilirubin Urine SMALL (*)    Protein, ur >300 (*)    All other components within normal limits  URINE MICROSCOPIC-ADD ON - Abnormal; Notable for the following:    Squamous Epithelial / LPF FEW (*)    Bacteria, UA FEW (*)    Casts GRANULAR CAST (*)    All other components within normal limits  URINE CULTURE  PREGNANCY, URINE    Imaging Review No results found. I have personally reviewed and evaluated these images and lab results as part of my medical decision-making.   EKG Interpretation None      MDM   Final diagnoses:  Cellulitis of leg, right    Patient to be admitted for cellulitis       Bethann BerkshireJoseph Addaleigh Nicholls, MD 06/04/15 2258

## 2015-06-04 NOTE — ED Notes (Signed)
Pt states she noticed blood in her urine this morning. Pt also states recent fever and generalized weakness.States swelling to RLE first noticed last night with pain to upper leg last night. Pt states sinus trouble x 1 mo with severe nasal congestion. States drainage is yellow.

## 2015-06-05 ENCOUNTER — Observation Stay (HOSPITAL_COMMUNITY): Payer: Medicaid Other

## 2015-06-05 ENCOUNTER — Encounter (HOSPITAL_COMMUNITY): Payer: Self-pay

## 2015-06-05 DIAGNOSIS — R319 Hematuria, unspecified: Secondary | ICD-10-CM

## 2015-06-05 DIAGNOSIS — L02419 Cutaneous abscess of limb, unspecified: Secondary | ICD-10-CM

## 2015-06-05 DIAGNOSIS — D509 Iron deficiency anemia, unspecified: Secondary | ICD-10-CM

## 2015-06-05 DIAGNOSIS — L03119 Cellulitis of unspecified part of limb: Secondary | ICD-10-CM

## 2015-06-05 LAB — COMPREHENSIVE METABOLIC PANEL
ALBUMIN: 3.1 g/dL — AB (ref 3.5–5.0)
ALT: 8 U/L — ABNORMAL LOW (ref 14–54)
ANION GAP: 6 (ref 5–15)
AST: 13 U/L — ABNORMAL LOW (ref 15–41)
Alkaline Phosphatase: 67 U/L (ref 38–126)
BUN: 13 mg/dL (ref 6–20)
CHLORIDE: 104 mmol/L (ref 101–111)
CO2: 26 mmol/L (ref 22–32)
Calcium: 8.6 mg/dL — ABNORMAL LOW (ref 8.9–10.3)
Creatinine, Ser: 1.1 mg/dL — ABNORMAL HIGH (ref 0.44–1.00)
GFR calc Af Amer: >60 mL/min (ref >60)
GFR calc non Af Amer: >60 mL/min (ref >60)
GLUCOSE: 88 mg/dL (ref 65–99)
POTASSIUM: 3 mmol/L — AB (ref 3.5–5.1)
SODIUM: 136 mmol/L (ref 135–145)
Total Bilirubin: 0.5 mg/dL (ref 0.3–1.2)
Total Protein: 7.4 g/dL (ref 6.5–8.1)

## 2015-06-05 LAB — CBC
HCT: 23.6 % — ABNORMAL LOW (ref 36.0–46.0)
HEMOGLOBIN: 7.5 g/dL — AB (ref 12.0–15.0)
MCH: 21.7 pg — ABNORMAL LOW (ref 26.0–34.0)
MCHC: 31.8 g/dL (ref 30.0–36.0)
MCV: 68.2 fL — ABNORMAL LOW (ref 78.0–100.0)
Platelets: 210 10*3/uL (ref 150–400)
RBC: 3.46 MIL/uL — AB (ref 3.87–5.11)
RDW: 16.4 % — AB (ref 11.5–15.5)
WBC: 11.6 10*3/uL — ABNORMAL HIGH (ref 4.0–10.5)

## 2015-06-05 LAB — VITAMIN B12: Vitamin B-12: 522 pg/mL (ref 180–914)

## 2015-06-05 LAB — RETICULOCYTES
RBC.: 3.7 MIL/uL — ABNORMAL LOW (ref 3.87–5.11)
Retic Count, Absolute: 40.7 10*3/uL (ref 19.0–186.0)
Retic Ct Pct: 1.1 % (ref 0.4–3.1)

## 2015-06-05 LAB — FOLATE: Folate: 12.2 ng/mL (ref >5.9)

## 2015-06-05 LAB — IRON AND TIBC
Iron: 8 ug/dL — ABNORMAL LOW (ref 28–170)
SATURATION RATIOS: 2 % — AB (ref 10.4–31.8)
TIBC: 357 ug/dL (ref 250–450)
UIBC: 349 ug/dL

## 2015-06-05 LAB — TSH: TSH: 1.164 u[IU]/mL (ref 0.350–4.500)

## 2015-06-05 LAB — FERRITIN: FERRITIN: 39 ng/mL (ref 11–307)

## 2015-06-05 MED ORDER — DEXTROSE 5 % IV SOLN
1.0000 g | Freq: Once | INTRAVENOUS | Status: AC
  Administered 2015-06-05: 1 g via INTRAVENOUS
  Filled 2015-06-05: qty 10

## 2015-06-05 MED ORDER — FERROUS SULFATE 325 (65 FE) MG PO TABS
325.0000 mg | ORAL_TABLET | Freq: Two times a day (BID) | ORAL | Status: DC
  Administered 2015-06-05 – 2015-06-06 (×2): 325 mg via ORAL
  Filled 2015-06-05 (×2): qty 1

## 2015-06-05 MED ORDER — CEFTRIAXONE SODIUM 1 G IJ SOLR
INTRAMUSCULAR | Status: AC
  Filled 2015-06-05: qty 10

## 2015-06-05 MED ORDER — POTASSIUM CHLORIDE CRYS ER 20 MEQ PO TBCR
40.0000 meq | EXTENDED_RELEASE_TABLET | Freq: Two times a day (BID) | ORAL | Status: AC
  Administered 2015-06-05 (×2): 40 meq via ORAL
  Filled 2015-06-05 (×2): qty 2

## 2015-06-05 MED ORDER — OXYCODONE-ACETAMINOPHEN 5-325 MG PO TABS
1.0000 | ORAL_TABLET | Freq: Four times a day (QID) | ORAL | Status: DC | PRN
  Administered 2015-06-05 – 2015-06-06 (×4): 1 via ORAL
  Filled 2015-06-05 (×4): qty 1

## 2015-06-05 MED ORDER — DOCUSATE SODIUM 100 MG PO CAPS
100.0000 mg | ORAL_CAPSULE | Freq: Two times a day (BID) | ORAL | Status: DC
  Administered 2015-06-05 – 2015-06-06 (×3): 100 mg via ORAL
  Filled 2015-06-05 (×3): qty 1

## 2015-06-05 MED ORDER — DEXTROSE 5 % IV SOLN
2.0000 g | INTRAVENOUS | Status: DC
  Administered 2015-06-05: 2 g via INTRAVENOUS
  Filled 2015-06-05 (×3): qty 2

## 2015-06-05 MED ORDER — ACETAMINOPHEN 500 MG PO TABS
500.0000 mg | ORAL_TABLET | Freq: Four times a day (QID) | ORAL | Status: DC | PRN
  Administered 2015-06-06: 500 mg via ORAL
  Filled 2015-06-05: qty 1

## 2015-06-05 MED ORDER — IOHEXOL 300 MG/ML  SOLN
100.0000 mL | Freq: Once | INTRAMUSCULAR | Status: AC | PRN
  Administered 2015-06-05: 100 mL via INTRAVENOUS

## 2015-06-05 MED ORDER — ACETAMINOPHEN 500 MG PO TABS: 500.0000 mg | ORAL_TABLET | Freq: Four times a day (QID) | ORAL | Status: DC | PRN

## 2015-06-05 NOTE — Care Management Note (Signed)
Case Management Note  Patient Details  Name: Robin Perez MRN: [id number] Date of Birth: Dec 24, 1985  Expected Discharge Date:    06/06/2015              Expected Discharge Plan:  Home/Self Care  In-House Referral:  NA  Discharge planning Services  CM Consult  Post Acute Care Choice:  NA Choice offered to:  NA  DME Arranged:    DME Agency:     HH Arranged:    HH Agency:     Status of Service:  Completed, signed off  Medicare Important Message Given:    Date Medicare IM Given:    Medicare IM give by:    Date Additional Medicare IM Given:    Additional Medicare Important Message give by:     If discussed at Long Length of Stay Meetings, dates discussed:    Additional Comments: Pt is from home with self care. Pt has medicaid pending. Pt see's Dr. Robynn PaneHasani for PCP care. No med needs anticipated, abx on $4 list at [facility]. No further CM needs noted.  Malcolm Metrohildress, Reva Pinkley Demske, RN 06/05/2015, 4:20 PM
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
## 2015-06-06 LAB — URINE CULTURE
CULTURE: NO GROWTH
Special Requests: NORMAL

## 2015-06-06 LAB — BASIC METABOLIC PANEL
ANION GAP: 5 (ref 5–15)
BUN: 11 mg/dL (ref 6–20)
CHLORIDE: 107 mmol/L (ref 101–111)
CO2: 25 mmol/L (ref 22–32)
Calcium: 8.5 mg/dL — ABNORMAL LOW (ref 8.9–10.3)
Creatinine, Ser: 0.91 mg/dL (ref 0.44–1.00)
GFR calc Af Amer: >60 mL/min (ref >60)
GLUCOSE: 111 mg/dL — AB (ref 65–99)
POTASSIUM: 3.7 mmol/L (ref 3.5–5.1)
Sodium: 137 mmol/L (ref 135–145)

## 2015-06-06 LAB — CBC
HEMATOCRIT: 23.8 % — AB (ref 36.0–46.0)
HEMOGLOBIN: 7.4 g/dL — AB (ref 12.0–15.0)
MCH: 21.3 pg — ABNORMAL LOW (ref 26.0–34.0)
MCHC: 31.1 g/dL (ref 30.0–36.0)
MCV: 68.6 fL — AB (ref 78.0–100.0)
Platelets: 224 10*3/uL (ref 150–400)
RBC: 3.47 MIL/uL — ABNORMAL LOW (ref 3.87–5.11)
RDW: 16.7 % — ABNORMAL HIGH (ref 11.5–15.5)
WBC: 9.1 10*3/uL (ref 4.0–10.5)

## 2015-06-06 LAB — PROTEIN, URINE, 24 HOUR
COLLECTION INTERVAL-UPROT: 24 h
PROTEIN, 24H URINE: 1140 mg/d — AB (ref 50–100)
Protein, Urine: 120 mg/dL
URINE TOTAL VOLUME-UPROT: 950 mL

## 2015-06-06 LAB — OCCULT BLOOD X 1 CARD TO LAB, STOOL: Fecal Occult Bld: NEGATIVE

## 2015-06-06 MED ORDER — FERROUS SULFATE 325 (65 FE) MG PO TABS: 325.0000 mg | ORAL_TABLET | Freq: Two times a day (BID) | ORAL | Status: DC

## 2015-06-06 MED ORDER — CEFUROXIME AXETIL 250 MG PO TABS: 250.0000 mg | ORAL_TABLET | Freq: Two times a day (BID) | ORAL | Status: DC

## 2015-06-06 NOTE — Progress Notes (Signed)
Patient discharged home today.  Patient was given discharge instructions, prescriptions, and care notes.  Patient verbalized understanding with no complaints or concerns voiced at this time.  Patient's IV was removed with catheter intact, no bleeding or complications.  Patient left unit in stable condition ambulating with a staff member.

## 2015-06-06 NOTE — Patient Care Conference (Signed)
Pt's significant other and multiple children at bedside. Significant other states he has missed work secondary to being with pt in hospital and requests work note. Will provide a hand-written work note. For the dates 10/13-10/15
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
## 2015-06-08 LAB — ANCA TITERS
Atypical P-ANCA titer: <1:20 {titer}
P-ANCA: <1:20 {titer}

## 2015-06-08 LAB — MPO/PR-3 (ANCA) ANTIBODIES: ANCA Proteinase 3: <3.5 U/mL (ref 0.0–3.5)

## 2015-06-25 ENCOUNTER — Emergency Department (HOSPITAL_COMMUNITY): Payer: Medicaid Other

## 2015-06-25 ENCOUNTER — Encounter (HOSPITAL_COMMUNITY): Payer: Self-pay | Admitting: Emergency Medicine

## 2015-06-25 ENCOUNTER — Inpatient Hospital Stay (HOSPITAL_COMMUNITY)
Admission: EM | Admit: 2015-06-25 | Discharge: 2015-06-28 | DRG: 872 | Disposition: A | Discharge status: Home / Self Care | Payer: Medicaid Other | Attending: Internal Medicine | Admitting: Internal Medicine

## 2015-06-25 DIAGNOSIS — Z720 Tobacco use: Secondary | ICD-10-CM | POA: Diagnosis not present

## 2015-06-25 DIAGNOSIS — Z9114 Patient's other noncompliance with medication regimen: Secondary | ICD-10-CM

## 2015-06-25 DIAGNOSIS — E876 Hypokalemia: Secondary | ICD-10-CM | POA: Diagnosis present

## 2015-06-25 DIAGNOSIS — L03115 Cellulitis of right lower limb: Secondary | ICD-10-CM | POA: Diagnosis present

## 2015-06-25 DIAGNOSIS — L03119 Cellulitis of unspecified part of limb: Secondary | ICD-10-CM | POA: Diagnosis present

## 2015-06-25 DIAGNOSIS — E871 Hypo-osmolality and hyponatremia: Secondary | ICD-10-CM | POA: Diagnosis present

## 2015-06-25 DIAGNOSIS — R319 Hematuria, unspecified: Secondary | ICD-10-CM | POA: Diagnosis present

## 2015-06-25 DIAGNOSIS — J328 Other chronic sinusitis: Secondary | ICD-10-CM | POA: Diagnosis not present

## 2015-06-25 DIAGNOSIS — A419 Sepsis, unspecified organism: Secondary | ICD-10-CM | POA: Diagnosis present

## 2015-06-25 DIAGNOSIS — J329 Chronic sinusitis, unspecified: Secondary | ICD-10-CM | POA: Diagnosis present

## 2015-06-25 DIAGNOSIS — D509 Iron deficiency anemia, unspecified: Secondary | ICD-10-CM | POA: Diagnosis not present

## 2015-06-25 DIAGNOSIS — F17210 Nicotine dependence, cigarettes, uncomplicated: Secondary | ICD-10-CM | POA: Diagnosis present

## 2015-06-25 DIAGNOSIS — M79661 Pain in right lower leg: Secondary | ICD-10-CM | POA: Diagnosis present

## 2015-06-25 LAB — BASIC METABOLIC PANEL
ANION GAP: 10 (ref 5–15)
BUN: 13 mg/dL (ref 6–20)
CALCIUM: 9.1 mg/dL (ref 8.9–10.3)
CHLORIDE: 102 mmol/L (ref 101–111)
CO2: 22 mmol/L (ref 22–32)
Creatinine, Ser: 1.01 mg/dL — ABNORMAL HIGH (ref 0.44–1.00)
GFR calc non Af Amer: >60 mL/min (ref >60)
Glucose, Bld: 95 mg/dL (ref 65–99)
Potassium: 3.5 mmol/L (ref 3.5–5.1)
Sodium: 134 mmol/L — ABNORMAL LOW (ref 135–145)

## 2015-06-25 LAB — CBC WITH DIFFERENTIAL/PLATELET
BASOS ABS: 0 10*3/uL (ref 0.0–0.1)
BASOS PCT: 0 %
Eosinophils Absolute: 0 10*3/uL (ref 0.0–0.7)
Eosinophils Relative: 0 %
HEMATOCRIT: 26.5 % — AB (ref 36.0–46.0)
HEMOGLOBIN: 8.1 g/dL — AB (ref 12.0–15.0)
Lymphocytes Relative: 3 %
Lymphs Abs: 0.7 10*3/uL (ref 0.7–4.0)
MCH: 20.9 pg — ABNORMAL LOW (ref 26.0–34.0)
MCHC: 30.6 g/dL (ref 30.0–36.0)
MCV: 68.3 fL — ABNORMAL LOW (ref 78.0–100.0)
Monocytes Absolute: 0.5 10*3/uL (ref 0.1–1.0)
Monocytes Relative: 3 %
NEUTROS ABS: 19.9 10*3/uL — AB (ref 1.7–7.7)
NEUTROS PCT: 94 %
Platelets: 438 10*3/uL — ABNORMAL HIGH (ref 150–400)
RBC: 3.88 MIL/uL (ref 3.87–5.11)
RDW: 17.5 % — ABNORMAL HIGH (ref 11.5–15.5)
WBC: 21.1 10*3/uL — AB (ref 4.0–10.5)

## 2015-06-25 LAB — I-STAT CG4 LACTIC ACID, ED
LACTIC ACID, VENOUS: 2.23 mmol/L — AB (ref 0.5–2.0)
LACTIC ACID, VENOUS: 1.44 mmol/L (ref 0.5–2.0)

## 2015-06-25 LAB — LACTIC ACID, PLASMA: LACTIC ACID, VENOUS: 2 mmol/L (ref 0.5–2.0)

## 2015-06-25 MED ORDER — VANCOMYCIN HCL IN DEXTROSE 1-5 GM/200ML-% IV SOLN
INTRAVENOUS | Status: AC
  Filled 2015-06-25: qty 200

## 2015-06-25 MED ORDER — ONDANSETRON HCL 4 MG/2ML IJ SOLN: 4.0000 mg | Freq: Four times a day (QID) | INTRAMUSCULAR | Status: DC | PRN

## 2015-06-25 MED ORDER — ACETAMINOPHEN 500 MG PO TABS
1000.0000 mg | ORAL_TABLET | Freq: Once | ORAL | Status: AC
  Administered 2015-06-25: 1000 mg via ORAL
  Filled 2015-06-25: qty 2

## 2015-06-25 MED ORDER — ACETAMINOPHEN 325 MG PO TABS
650.0000 mg | ORAL_TABLET | Freq: Four times a day (QID) | ORAL | Status: DC | PRN
  Administered 2015-06-26 – 2015-06-27 (×3): 650 mg via ORAL
  Filled 2015-06-25 (×3): qty 2

## 2015-06-25 MED ORDER — FENTANYL CITRATE (PF) 100 MCG/2ML IJ SOLN
100.0000 ug | Freq: Once | INTRAMUSCULAR | Status: AC
  Administered 2015-06-25: 100 ug via INTRAVENOUS
  Filled 2015-06-25: qty 2

## 2015-06-25 MED ORDER — SODIUM CHLORIDE 0.9 % IV SOLN
INTRAVENOUS | Status: DC
  Administered 2015-06-25 – 2015-06-28 (×4): via INTRAVENOUS

## 2015-06-25 MED ORDER — VANCOMYCIN HCL IN DEXTROSE 1-5 GM/200ML-% IV SOLN: 1000.0000 mg | Freq: Once | INTRAVENOUS | Status: DC

## 2015-06-25 MED ORDER — SODIUM CHLORIDE 0.9 % IV BOLUS (SEPSIS)
1000.0000 mL | Freq: Once | INTRAVENOUS | Status: AC
  Administered 2015-06-25: 1000 mL via INTRAVENOUS

## 2015-06-25 MED ORDER — ACETAMINOPHEN 500 MG PO TABS: 1000.0000 mg | ORAL_TABLET | Freq: Once | ORAL | Status: DC

## 2015-06-25 MED ORDER — FERROUS SULFATE 325 (65 FE) MG PO TABS
325.0000 mg | ORAL_TABLET | Freq: Two times a day (BID) | ORAL | Status: DC
  Administered 2015-06-26 – 2015-06-28 (×5): 325 mg via ORAL
  Filled 2015-06-25 (×5): qty 1

## 2015-06-25 MED ORDER — OXYCODONE HCL 5 MG PO TABS
5.0000 mg | ORAL_TABLET | ORAL | Status: DC | PRN
  Administered 2015-06-25 – 2015-06-27 (×7): 5 mg via ORAL
  Filled 2015-06-25 (×7): qty 1

## 2015-06-25 MED ORDER — ONDANSETRON HCL 4 MG PO TABS: 4.0000 mg | ORAL_TABLET | Freq: Four times a day (QID) | ORAL | Status: DC | PRN

## 2015-06-25 MED ORDER — ENOXAPARIN SODIUM 40 MG/0.4ML ~~LOC~~ SOLN
40.0000 mg | SUBCUTANEOUS | Status: DC
  Administered 2015-06-25: 40 mg via SUBCUTANEOUS
  Filled 2015-06-25: qty 0.4

## 2015-06-25 MED ORDER — VANCOMYCIN HCL IN DEXTROSE 1-5 GM/200ML-% IV SOLN
1000.0000 mg | Freq: Three times a day (TID) | INTRAVENOUS | Status: DC
  Administered 2015-06-26 – 2015-06-28 (×7): 1000 mg via INTRAVENOUS
  Filled 2015-06-25 (×11): qty 200

## 2015-06-25 MED ORDER — ACETAMINOPHEN 650 MG RE SUPP: 650.0000 mg | Freq: Four times a day (QID) | RECTAL | Status: DC | PRN

## 2015-06-25 MED ORDER — VANCOMYCIN HCL IN DEXTROSE 1-5 GM/200ML-% IV SOLN
1000.0000 mg | Freq: Once | INTRAVENOUS | Status: AC
  Administered 2015-06-25: 1000 mg via INTRAVENOUS
  Filled 2015-06-25: qty 200

## 2015-06-25 MED ORDER — SODIUM CHLORIDE 0.9 % IJ SOLN
3.0000 mL | Freq: Two times a day (BID) | INTRAMUSCULAR | Status: DC
  Administered 2015-06-26 – 2015-06-27 (×2): 3 mL via INTRAVENOUS

## 2015-06-25 NOTE — ED Notes (Signed)
Pt c/o cellulitis to rle x 3 weeks, states pain is worse today also c/o nausea.

## 2015-06-25 NOTE — ED Notes (Signed)
Dr. Danford at bedside  

## 2015-06-25 NOTE — Progress Notes (Signed)
ANTIBIOTIC CONSULT NOTE - INITIAL  Pharmacy Consult for Vancomycin Indication: cellulitis   No Known Allergies  Patient Measurements: Height: 5\' 7"  (170.2 cm) Weight: 219 lb (99.338 kg) IBW/kg (Calculated) : 61.6   Vital Signs: Temp: 99.9 F (37.7 C) (11/03 2211) Temp Source: Oral (11/03 2211) BP: 114/62 mmHg (11/03 2211) Pulse Rate: 114 (11/03 2008) Intake/Output from previous day:   Intake/Output from this shift:    Labs:  Recent Labs  06/25/15 1730  WBC 21.1*  HGB 8.1*  PLT 438*  CREATININE 1.01*   Estimated Creatinine Clearance: 101.3 mL/min (by C-G formula based on Cr of 1.01). No results for input(s): VANCOTROUGH, VANCOPEAK, VANCORANDOM, GENTTROUGH, GENTPEAK, GENTRANDOM, TOBRATROUGH, TOBRAPEAK, TOBRARND, AMIKACINPEAK, AMIKACINTROU, AMIKACIN in the last 72 hours.   Microbiology: Recent Results (from the past 720 hour(s))  Urine culture     Status: None   Collection Time: 06/04/15 11:00 PM  Result Value Ref Range Status   Specimen Description URINE, CLEAN CATCH  Final   Special Requests Normal  Final   Culture   Final    NO GROWTH 1 DAY Performed at [facility]    Report Status 06/06/2015 FINAL  Final  Blood culture (routine x 2)     Status: None (Preliminary result)   Collection Time: 06/25/15  7:35 PM  Result Value Ref Range Status   Specimen Description BLOOD LEFT ARM  Final   Special Requests BOTTLES DRAWN AEROBIC AND ANAEROBIC 6CC  Final   Culture PENDING  Incomplete   Report Status PENDING  Incomplete  Blood culture (routine x 2)     Status: None (Preliminary result)   Collection Time: 06/25/15  7:41 PM  Result Value Ref Range Status   Specimen Description BLOOD LEFT ARM  Final   Special Requests BOTTLES DRAWN AEROBIC AND ANAEROBIC 6CC  Final   Culture PENDING  Incomplete   Report Status PENDING  Incomplete    Medical History: Past Medical History  Diagnosis Date  . Vertigo   . Asthma   . Incisional pain 05/09/2013    Will rx  cipro    Medications:  Prescriptions prior to admission  Medication Sig Dispense Refill Last Dose  . acetaminophen (TYLENOL) 500 MG tablet Take 500 mg by mouth every 6 (six) hours as needed for mild pain.    06/25/2015 at Unknown time  . ferrous sulfate 325 (65 FE) MG tablet Take 1 tablet (325 mg total) by mouth 2 (two) times daily with a meal. 60 tablet 0 06/24/2015 at Unknown time   Assessment: Okay for Protocol, received initial dose in ED.  Goal of Therapy:  Vancomycin trough level 10-15 mcg/ml  Plan:  Vancomycin 1gm IV every 8 hours. Measure antibiotic drug levels at steady state Follow up culture results  Mady GemmaHayes, Smriti Barkow R 06/25/2015,10:14 PM

## 2015-06-25 NOTE — ED Provider Notes (Signed)
CSN: [id number]     Arrival date & time    History   First MD Initiated Contact with Patient 06/25/15 1716     Chief Complaint  Patient presents with  . Leg Pain     (Consider location/radiation/quality/duration/timing/severity/associated sxs/prior Treatment) HPI Comments: The patient is a 29 year old female, she has a history of swelling to the right lower extremity with redness, was treated with an antibiotic for cellulitis, discharged with Ceftin after being admitted for the cellulitis of her leg. She states that she doesn't think he got any better, it is now become worse over the last 24 hours with increased redness, swelling and tenderness. She feels like she has a fever but has not measured one. She denies being a diabetic. The symptoms are persistent, acutely worsened today, not associated with vomiting or diarrhea. She arrives by ambulance transport  Patient is a 29 y.o. female presenting with leg pain. The history is provided by the patient and medical records.  Leg Pain   Past Medical History  Diagnosis Date  . Vertigo   . Asthma   . Incisional pain 05/09/2013    Will rx cipro   Past Surgical History  Procedure Laterality Date  . Cesarean section    . Cesarean section    . Cesarean section with bilateral tubal ligation N/A 04/23/2013    Procedure: CESAREAN SECTION WITH BILATERAL TUBAL LIGATION;  Surgeon: Willodean Rosenthal, MD;  Location: [facility];  Service: Obstetrics;  Laterality: N/A;   History reviewed. No pertinent family history. Social History  Substance Use Topics  . Smoking status: Current Every Day Smoker -- 0.25 packs/day    Types: Cigarettes  . Smokeless tobacco: Never Used  . Alcohol Use: No   OB History    Gravida Para Term Preterm AB TAB SAB Ectopic Multiple Living   Review of Systems  All other systems reviewed and are negative.     Allergies  Review of patient's allergies indicates no known allergies.  Home Medications    Prior to Admission medications   Medication Sig Start Date End Date Taking? Authorizing Provider  acetaminophen (TYLENOL) 500 MG tablet Take 500 mg by mouth every 6 (six) hours as needed for mild pain.    Yes Historical Provider, MD  ferrous sulfate 325 (65 FE) MG tablet Take 1 tablet (325 mg total) by mouth 2 (two) times daily with a meal. 06/06/15  Yes Jerald Kief, MD  cefUROXime (CEFTIN) 250 MG tablet Take 1 tablet (250 mg total) by mouth 2 (two) times daily with a meal. Patient not taking: Reported on 06/25/2015 06/06/15   Jerald Kief, MD   BP 103/71 mmHg  Pulse 104  Temp(Src) 100.1 F (37.8 C) (Oral)  Resp 27  Ht  (1.702 m)  Wt 219 lb (99.338 kg)  BMI 34.29 kg/m2  SpO2 100%  LMP 06/13/2015 Physical Exam  Constitutional: She appears well-developed and well-nourished. No distress.  HENT:  Head: Normocephalic and atraumatic.  Mouth/Throat: Oropharynx is clear and moist. No oropharyngeal exudate.  Eyes: Conjunctivae and EOM are normal. Pupils are equal, round, and reactive to light. Right eye exhibits no discharge. Left eye exhibits no discharge. No scleral icterus.  Neck: Normal range of motion. Neck supple. No JVD present. No thyromegaly present.  Cardiovascular: Regular rhythm, normal heart sounds and intact distal pulses.  Exam reveals no gallop and no friction rub.   No murmur heard.  Tachycardia present  Pulmonary/Chest: Effort normal and breath sounds normal. No respiratory distress. She has no wheezes. She has no rales.  Abdominal: Soft. Bowel sounds are normal. She exhibits no distension and no mass. There is no tenderness.  Musculoskeletal: Normal range of motion. She exhibits edema and tenderness.  Increased swelling and redness of the right lower extremity below the knee, there is circumferential erythema with tenderness and edema in the lower extremity to the ankle  Lymphadenopathy:    She has no cervical adenopathy.  Neurological: She is alert.  Coordination normal.  Skin: Skin is warm and dry. No rash noted. No erythema.  Psychiatric: She has a normal mood and affect. Her behavior is normal.  Nursing note and vitals reviewed.   ED Course  Procedures (including critical care time) Labs Review Labs Reviewed  CBC WITH DIFFERENTIAL/PLATELET - Abnormal; Notable for the following:    WBC 21.1 (*)    Hemoglobin 8.1 (*)    HCT 26.5 (*)    MCV 68.3 (*)    MCH 20.9 (*)    RDW 17.5 (*)    Platelets 438 (*)    Neutro Abs 19.9 (*)    All other components within normal limits  BASIC METABOLIC PANEL - Abnormal; Notable for the following:    Sodium 134 (*)    Creatinine, Ser 1.01 (*)    All other components within normal limits  I-STAT CG4 LACTIC ACID, ED - Abnormal; Notable for the following:    Lactic Acid, Venous 2.23 (*)    All other components within normal limits  CULTURE, BLOOD (ROUTINE X 2)  CULTURE, BLOOD (ROUTINE X 2)    Imaging Review US Venous Img Lower Unilateral Right  06/25/2015  CLINICAL DATA:  Right lower extremity cellulitis for 3 weeks. Nausea today. Pain, edema, and color changes. EXAM: Right LOWER EXTREMITY VENOUS DOPPLER ULTRASOUND TECHNIQUE: Gray-scale sonography with graded compression, as well as color Doppler and duplex ultrasound were performed to evaluate the lower extremity deep venous systems from the level of the common femoral vein and including the common femoral, femoral, profunda femoral, popliteal and calf veins including the posterior tibial, peroneal and gastrocnemius veins when visible. The superficial great saphenous vein was also interrogated. Spectral Doppler was utilized to evaluate flow at rest and with distal augmentation maneuvers in the common femoral, femoral and popliteal veins. COMPARISON:  None. FINDINGS: Contralateral Common Femoral Vein: Respiratory phasicity is normal and symmetric with the symptomatic side. No evidence of thrombus. Normal compressibility. Common Femoral Vein: No  evidence of thrombus. Normal compressibility, respiratory phasicity and response to augmentation. Saphenofemoral Junction: No evidence of thrombus. Normal compressibility and flow on color Doppler imaging. Profunda Femoral Vein: No evidence of thrombus. Normal compressibility and flow on color Doppler imaging. Femoral Vein: No evidence of thrombus. Normal compressibility, respiratory phasicity and response to augmentation. Popliteal Vein: No evidence of thrombus. Normal compressibility, respiratory phasicity and response to augmentation. Calf Veins: No evidence of thrombus. Normal compressibility and flow on color Doppler imaging. Superficial Great Saphenous Vein: No evidence of thrombus. Normal compressibility and flow on color Doppler imaging. Venous Reflux:  None. Other Findings: Soft tissue edema demonstrated in the calf region. No discrete fluid collections demonstrated. IMPRESSION: No evidence of deep venous thrombosis. Electronically Signed   By: Burman Nieves M.D.   On: 06/25/2015 18:09   I have personally reviewed and evaluated these images and lab results as part of my medical decision-making.    MDM   Final diagnoses:  Sepsis,  due to unspecified organism [facility](HCC)  Cellulitis of right lower extremity    The patient has significant cellulitis which is circumferential, very tender and very warm to the touch. She is tachycardic, I am concerned that she may have early sepsis secondary to this. She will be given IV fluids, IV antibiotics, labs, possible admission to the hospital.  CBC is significantly elevated at over 20,000, lactic acid is over 2, temperature is 103, tachycardia is persistent despite IV fluids, no hypotension. The ultrasound is negative for DVT.  The patient was reexamined, persistently tachycardic to 115, 30 mL/kg of IV fluids have been ordered, broad-spectrum antibodies to cover for soft tissue infection, discussed the case with the hospitalist, Dr. Maryfrances Bunnellanford, he will admit  the patient to the hospital. The patient is septic, critical care provided  CRITICAL CARE Performed by: Vida RollerMILLER,Aqua Denslow D Total critical care time: 35 minutes Critical care time was exclusive of separately billable procedures and treating other patients. Critical care was necessary to treat or prevent imminent or life-threatening deterioration. Critical care was time spent personally by me on the following activities: development of treatment plan with patient and/or surrogate as well as nursing, discussions with consultants, evaluation of patient's response to treatment, examination of patient, obtaining history from patient or surrogate, ordering and performing treatments and interventions, ordering and review of laboratory studies, ordering and review of radiographic studies, pulse oximetry and re-evaluation of patient's condition.     Eber HongBrian Pearson Picou, MD 06/25/15 [phone]

## 2015-06-25 NOTE — H&P (Signed)
History and Physical  Patient Name: Robin Perez     WGN:[id number]    DOB: 1985/11/19    DOA: 06/25/2015 Referring physician: Eber Hong, MD PCP: No PCP Per Patient      Chief Complaint: Leg pain  HPI: Robin Perez is a 29 y.o. female with a past medical history significant for microcytic anemia and hematuria, chronic sinusitis, and cellulitis who presents with worsening of cellulitis.  The patient was admitted for 3 days last month because of gross hematuria, hemoglobin 7.9 g/dL and right lower extremity cellulitis. She was treated with iron for anemia and ceftriaxone/discharged on Ceftin for cellulitis which she states she completed. She doesn't think that she has had follow-up hemoglobin check.  Since discharge the patient has had no improvement in her right leg redness or swelling, and now in the last 24 hours, her right leg has become severely painful and she has developed fever, malaise and global weakness, such that she had to leave work and come to the ED.  In the ED, was febrile to 103F, tachycardic, and with lactic acid level of 2.23 mmol/L and leukocytosis to 21K/uL.  A Doppler ultrasound of the right lower extremity showed no DVT.     Review of Systems:  Pt complains of fever, malaise, weakness, diffuse pain, right lower extremity pain, increased redness, swelling in the setting of chronic right lower extremity swelling. Pt denies any more hematuria, vaginal bleeding, cough, sputum.  All other systems negative except as just noted or noted in the history of present illness.  No Known Allergies  Prior to Admission medications   Medication Sig Start Date End Date Taking? Authorizing Provider  acetaminophen (TYLENOL) 500 MG tablet Take 500 mg by mouth every 6 (six) hours as needed for mild pain.    Yes Historical Provider, MD  ferrous sulfate 325 (65 FE) MG tablet Take 1 tablet (325 mg total) by mouth 2 (two) times daily with a meal. 06/06/15  Yes Jerald Kief, MD     Past Medical History  Diagnosis Date  . Vertigo   . Asthma   . Incisional pain 05/09/2013    Will rx cipro    Past Surgical History  Procedure Laterality Date  . Cesarean section    . Cesarean section    . Cesarean section with bilateral tubal ligation N/A 04/23/2013    Procedure: CESAREAN SECTION WITH BILATERAL TUBAL LIGATION;  Surgeon: Willodean Rosenthal, MD;  Location: [facility];  Service: Obstetrics;  Laterality: N/A;    Family history: Her mother and father and extended family are healthy as far she knows.  No family history of lupus, rheumatoid arthritis, or known autoimmune disease. No family history of sickle cell disease.  Social History: Patient lives with family. She works and is independent with all ADLs. She is an active smoker.      Physical Exam: BP 102/56 mmHg  Pulse 114  Temp(Src) 100.4 F (38 C) (Oral)  Resp 23  Ht  (1.702 m)  Wt 99.338 kg (219 lb)  BMI 34.29 kg/m2  SpO2 98%  LMP 06/13/2015 General appearance: Well-developed, adult female, alert and in severe distress from pain .   Eyes: Anicteric, conjunctiva pink, lids and lashes normal.     ENT: Voice sounds congested. No sinus tenderness. No nasal deformity, discharge, or epistaxis.  OP tacky and lips dry without lesions.   Skin: Warm and dry.  On the right calf, especially medially, there is redness, scaling of the skin, and  swelling with tenderness to palpation in a region that is somewhat demarcated. Cardiac: Tachycardic, regular, nl S1-S2, no murmurs appreciated.  Capillary refill is brisk.  JVP normal.  1+ LE edema on the right.  Radial and DP pulses 2+ and symmetric. Respiratory: Normal respiratory rate and rhythm.  CTAB without rales or wheezes. Abdomen: Abdomen soft without rigidity.  No TTP. No ascites, distension.   Neuro: Sensorium intact and responding to questions, attention normal.  Speech is fluent.  Moves all extremities equally and with normal coordination.    Psych: Behavior  appropriate.  Affect normal but anxious.  No evidence of aural or visual hallucinations or delusions.       Labs on Admission:  The metabolic panel shows mild hyponatremia.  Serum creatinine 1.01 mg/dL from a baseline of 0.9. The lactic acid level was initially 2.23 mmol per liter.  Blood cultures are pending The complete blood count shows leukocytosis to 21.1K/uL, anemia improved from previously with hemoglobin 8.1 g/dL and elevated platelets.   Radiological Exams on Admission: Personally reviewed: [place] Venous Img Lower Unilateral Right  06/25/2015  CLINICAL DATA:  Right lower extremity cellulitis for 3 weeks. Nausea today. Pain, edema, and color changes. EXAM: Right LOWER EXTREMITY VENOUS DOPPLER ULTRASOUND TECHNIQUE: Gray-scale sonography with graded compression, as well as color Doppler and duplex ultrasound were performed to evaluate the lower extremity deep venous systems from the level of the common femoral vein and including the common femoral, femoral, profunda femoral, popliteal and calf veins including the posterior tibial, peroneal and gastrocnemius veins when visible. The superficial great saphenous vein was also interrogated. Spectral Doppler was utilized to evaluate flow at rest and with distal augmentation maneuvers in the common femoral, femoral and popliteal veins. COMPARISON:  None. FINDINGS: Contralateral Common Femoral Vein: Respiratory phasicity is normal and symmetric with the symptomatic side. No evidence of thrombus. Normal compressibility. Common Femoral Vein: No evidence of thrombus. Normal compressibility, respiratory phasicity and response to augmentation. Saphenofemoral Junction: No evidence of thrombus. Normal compressibility and flow on color Doppler imaging. Profunda Femoral Vein: No evidence of thrombus. Normal compressibility and flow on color Doppler imaging. Femoral Vein: No evidence of thrombus. Normal compressibility, respiratory phasicity and response to  augmentation. Popliteal Vein: No evidence of thrombus. Normal compressibility, respiratory phasicity and response to augmentation. Calf Veins: No evidence of thrombus. Normal compressibility and flow on color Doppler imaging. Superficial Great Saphenous Vein: No evidence of thrombus. Normal compressibility and flow on color Doppler imaging. Venous Reflux:  None. Other Findings: Soft tissue edema demonstrated in the calf region. No discrete fluid collections demonstrated. IMPRESSION: No evidence of deep venous thrombosis. Electronically Signed   By: Burman NievesWilliam  Stevens M.D.   On: 06/25/2015 18:09      Assessment/Plan  1. Early sepsis from cellulitis:  This is new.  The patient was treated with a third-generation cephalosporin during her last hospitalization.  No known contacts with MRSA, but will broaden coverage during this relapse.  At the time of my evaluation, the patient has signs of hemodynamic improvement or cleared lactic acid and is stable for a medical-surgical bed.  -Vancomycin, renally dosed -Follow blood culture  2. Anemia:  This is improving, but the source is not clear.  During her last hospitalization, the patient had iron deficiency, negative ANCA titers and normal B12 and folate.  She had proteinuria and hematuria. -Continue iron -Repeat UA  3. Hyponatremia:  Suspect from infection and hypovolemia. -Repeat BMP tomorrow  4. Elevated lactic acid:  Suspect from infection  and hypovolemia. -Repeat lactic acid completed      DVT PPx: SCDs Diet: Regular Code Status: Full Medical decision making: What exists of the patient's previous chart was reviewed in depth and the case was discussed with Dr. Hyacinth Meeker. Patient seen 9:04 PM on 06/25/2015.  Disposition Plan:  IV antibiotics.  Fluid resuscitation.  Expect admission for 2-3 days.      Alberteen Sam [facility] Pager [phone]

## 2015-06-26 DIAGNOSIS — Z720 Tobacco use: Secondary | ICD-10-CM

## 2015-06-26 DIAGNOSIS — L03115 Cellulitis of right lower limb: Secondary | ICD-10-CM

## 2015-06-26 DIAGNOSIS — A419 Sepsis, unspecified organism: Principal | ICD-10-CM

## 2015-06-26 DIAGNOSIS — D509 Iron deficiency anemia, unspecified: Secondary | ICD-10-CM

## 2015-06-26 LAB — URINE MICROSCOPIC-ADD ON

## 2015-06-26 LAB — COMPREHENSIVE METABOLIC PANEL
ALK PHOS: 48 U/L (ref 38–126)
ALT: 6 U/L — AB (ref 14–54)
AST: 13 U/L — AB (ref 15–41)
Albumin: 2.7 g/dL — ABNORMAL LOW (ref 3.5–5.0)
Anion gap: 5 (ref 5–15)
BUN: 8 mg/dL (ref 6–20)
CO2: 22 mmol/L (ref 22–32)
CREATININE: 0.85 mg/dL (ref 0.44–1.00)
Calcium: 8 mg/dL — ABNORMAL LOW (ref 8.9–10.3)
Chloride: 107 mmol/L (ref 101–111)
GFR calc Af Amer: >60 mL/min (ref >60)
Glucose, Bld: 83 mg/dL (ref 65–99)
Potassium: 3.3 mmol/L — ABNORMAL LOW (ref 3.5–5.1)
Sodium: 134 mmol/L — ABNORMAL LOW (ref 135–145)
Total Bilirubin: 0.6 mg/dL (ref 0.3–1.2)
Total Protein: 6.5 g/dL (ref 6.5–8.1)

## 2015-06-26 LAB — URINALYSIS, ROUTINE W REFLEX MICROSCOPIC
Bilirubin Urine: NEGATIVE
GLUCOSE, UA: NEGATIVE mg/dL
Ketones, ur: NEGATIVE mg/dL
Leukocytes, UA: NEGATIVE
Nitrite: NEGATIVE
Protein, ur: NEGATIVE mg/dL
SPECIFIC GRAVITY, URINE: 1.01 (ref 1.005–1.030)
Urobilinogen, UA: 0.2 mg/dL (ref 0.0–1.0)
pH: 6 (ref 5.0–8.0)

## 2015-06-26 LAB — CBC
HCT: 21 % — ABNORMAL LOW (ref 36.0–46.0)
HEMOGLOBIN: 6.6 g/dL — AB (ref 12.0–15.0)
MCH: 21.2 pg — ABNORMAL LOW (ref 26.0–34.0)
MCHC: 31.4 g/dL (ref 30.0–36.0)
MCV: 67.5 fL — AB (ref 78.0–100.0)
PLATELETS: 360 10*3/uL (ref 150–400)
RBC: 3.11 MIL/uL — AB (ref 3.87–5.11)
RDW: 17.4 % — ABNORMAL HIGH (ref 11.5–15.5)
WBC: 20.3 10*3/uL — AB (ref 4.0–10.5)

## 2015-06-26 LAB — LACTIC ACID, PLASMA: Lactic Acid, Venous: 1.7 mmol/L (ref 0.5–2.0)

## 2015-06-26 MED ORDER — POTASSIUM CHLORIDE CRYS ER 20 MEQ PO TBCR
40.0000 meq | EXTENDED_RELEASE_TABLET | ORAL | Status: AC
  Administered 2015-06-26 (×2): 40 meq via ORAL
  Filled 2015-06-26 (×3): qty 2

## 2015-06-26 MED ORDER — SODIUM CHLORIDE 0.9 % IV SOLN
510.0000 mg | Freq: Once | INTRAVENOUS | Status: AC
  Administered 2015-06-26: 510 mg via INTRAVENOUS
  Filled 2015-06-26: qty 17

## 2015-06-26 MED ORDER — OXYCODONE-ACETAMINOPHEN 5-325 MG PO TABS
2.0000 | ORAL_TABLET | Freq: Once | ORAL | Status: AC
  Administered 2015-06-26: 2 via ORAL
  Filled 2015-06-26: qty 2

## 2015-06-26 NOTE — Progress Notes (Signed)
[facility] PROGRESS NOTE  Robin Perez ZOX:[id number] DOB: 19-Jan-1986 DOA: 06/25/2015 PCP: No PCP Per Patient  Assessment/Plan: 1. Sepsis secondary to cellulitis. WBC 20.3. Lactic acid now WNL following IV fluids. Currently afebrile but had a fever of 100.4 overnight. BC pending. Will continue current treatments. 2. RLE cellulitis, continue broad spectrum abx. RLE [place] negative for DVT. Will keep RLE elevated.  3. Iron deficiency anemia with hx of noncompliance with iron pills.  Hgb 6.6 today. Likely related to infection/hemodilution. Recommended transfusion however patient is hesitant and refusing at present.  Will give IV iron and repeat CBC tomorrow. If Hgb not improved, will reconsider transfusion. Patient denies any symptoms including SOB, lightheadedness, or CP at this time.  4. Hypokalemia, will replete. 5. Hyponatremia, will replete.  6. Tobacco abuse, counseled on the importance of cessation  Code Status:Full DVT prophylaxis: SCDs Family Communication: Discussed with patient who understands and has no concerns at this time. Disposition Plan: Anticipate discharge within 1-2 days.    Consultants:    Procedures:    Antibiotics:  Vancomycin 11/3>>  HPI/Subjective: Feels okay. States her RLE is somewhat improved however still swollen and red. Has a HA but denies any dizziness, lightheadedness, CP or SOB.   Objective: Filed Vitals:   06/26/15 0620  BP: 112/57  Pulse: 97  Temp: 99 F (37.2 C)  Resp: 20    Intake/Output Summary (Last 24 hours) at 06/26/15 0816 Last data filed at 06/26/15 0700  Gross per 24 hour  Intake 1058.33 ml  Output      0 ml  Net 1058.33 ml   Filed Weights   06/25/15 1713 06/25/15 2211  Weight: 99.338 kg (219 lb) 99.335 kg (218 lb 15.9 oz)    Exam:  General: NAD, looks comfortable Cardiovascular: RRR, S1, S2  Respiratory: clear bilaterally, No wheezing, rales or rhonchi Abdomen: soft, non tender, no distention , bowel  sounds normal Musculoskeletal: Erythema throughout right lower leg with associated edema. No draining wounds noted.   Data Reviewed: Basic Metabolic Panel:  Recent Labs Lab 06/25/15 1730 06/26/15 0520  NA 134* 134*  K 3.5 3.3*  CL 102 107  CO2 22 22  GLUCOSE 95 83  BUN 13 8  CREATININE 1.01* 0.85  CALCIUM 9.1 8.0*   Liver Function Tests:  Recent Labs Lab 06/26/15 0520  AST 13*  ALT 6*  ALKPHOS 48  BILITOT 0.6  PROT 6.5  ALBUMIN 2.7*    CBC:  Recent Labs Lab 06/25/15 1730 06/26/15 0520  WBC 21.1* 20.3*  NEUTROABS 19.9*  --   HGB 8.1* 6.6*  HCT 26.5* 21.0*  MCV 68.3* 67.5*  PLT 438* 360      Recent Results (from the past 240 hour(s))  Blood culture (routine x 2)     Status: None (Preliminary result)   Collection Time: 06/25/15  7:35 PM  Result Value Ref Range Status   Specimen Description BLOOD LEFT ARM  Final   Special Requests BOTTLES DRAWN AEROBIC AND ANAEROBIC 6CC  Final   Culture NO GROWTH < 12 HOURS  Final   Report Status PENDING  Incomplete  Blood culture (routine x 2)     Status: None (Preliminary result)   Collection Time: 06/25/15  7:41 PM  Result Value Ref Range Status   Specimen Description BLOOD LEFT ARM  Final   Special Requests BOTTLES DRAWN AEROBIC AND ANAEROBIC 6CC  Final   Culture NO GROWTH < 12 HOURS  Final   Report Status PENDING  Incomplete  Studies: [place] Venous Img Lower Unilateral Right  06/25/2015  CLINICAL DATA:  Right lower extremity cellulitis for 3 weeks. Nausea today. Pain, edema, and color changes. EXAM: Right LOWER EXTREMITY VENOUS DOPPLER ULTRASOUND TECHNIQUE: Gray-scale sonography with graded compression, as well as color Doppler and duplex ultrasound were performed to evaluate the lower extremity deep venous systems from the level of the common femoral vein and including the common femoral, femoral, profunda femoral, popliteal and calf veins including the posterior tibial, peroneal and gastrocnemius veins when visible.  The superficial great saphenous vein was also interrogated. Spectral Doppler was utilized to evaluate flow at rest and with distal augmentation maneuvers in the common femoral, femoral and popliteal veins. COMPARISON:  None. FINDINGS: Contralateral Common Femoral Vein: Respiratory phasicity is normal and symmetric with the symptomatic side. No evidence of thrombus. Normal compressibility. Common Femoral Vein: No evidence of thrombus. Normal compressibility, respiratory phasicity and response to augmentation. Saphenofemoral Junction: No evidence of thrombus. Normal compressibility and flow on color Doppler imaging. Profunda Femoral Vein: No evidence of thrombus. Normal compressibility and flow on color Doppler imaging. Femoral Vein: No evidence of thrombus. Normal compressibility, respiratory phasicity and response to augmentation. Popliteal Vein: No evidence of thrombus. Normal compressibility, respiratory phasicity and response to augmentation. Calf Veins: No evidence of thrombus. Normal compressibility and flow on color Doppler imaging. Superficial Great Saphenous Vein: No evidence of thrombus. Normal compressibility and flow on color Doppler imaging. Venous Reflux:  None. Other Findings: Soft tissue edema demonstrated in the calf region. No discrete fluid collections demonstrated. IMPRESSION: No evidence of deep venous thrombosis. Electronically Signed   By: Burman NievesWilliam  Stevens M.D.   On: 06/25/2015 18:09    Scheduled Meds: . ferrous sulfate  325 mg Oral BID WC  . sodium chloride  3 mL Intravenous Q12H  . vancomycin  1,000 mg Intravenous Q8H   Continuous Infusions: . sodium chloride 125 mL/hr at 06/26/15 0746    Principal Problem:   Lower extremity cellulitis Active Problems:   Hematuria, undiagnosed cause   Chronic sinusitis   Tobacco abuse   Sepsis (HCC)    Time spent: 20 minutes   Jehanzeb Memon. MD [facility] Pager [phone]. If 7PM-7AM, please contact night-coverage at  [email or web address], password [email or web address] 06/26/2015, 8:16 AM  LOS: 1 day     By signing my name below, I, Burnett HarryJennifer Gregorio, attest that this documentation has been prepared under the direction and in the presence of Darden RestaurantsJehanzeb Memon. MD Electronically Signed: Burnett HarryJennifer Gregorio, Scribe. 11/04/201610:48am  I, Dr. Erick BlinksJehanzeb Memon, personally performed the services described in this documentaiton. All medical record entries made by the scribe were at my direction and in my presence. I have reviewed the chart and agree that the record reflects my personal performance and is accurate and complete  Erick BlinksJehanzeb Memon, MD, 06/26/2015 11:04 AM

## 2015-06-26 NOTE — Progress Notes (Signed)
Patients complains of pain and states that present pain medicine is ineffective in relieving pain. Will make the on-call MD aware and follow any new orders received.

## 2015-06-26 NOTE — Progress Notes (Signed)
CRITICAL VALUE ALERT  Critical value received:  Hgb=6.6  Date of notification:  06/26/15  Time of notification:  0700  Critical value read back:Yes.    Nurse who received alert:  Jinny Sandersisha Siona Coulston, RN  MD notified (1st page):  Dr. Kerry HoughMemon  Time of first page: 0710  MD notified (2nd page):  Time of second page:  Responding MD:  Dr. Kerry HoughMemon  Time MD responded:  [phone].  New orders placed into [facility].

## 2015-06-26 NOTE — Progress Notes (Signed)
Text page sent to notify Dr. Kerry Houghmemon that patient is refusing lab draws.

## 2015-06-26 NOTE — Care Management Note (Signed)
Case Management Note  Patient Details  Name: Robin Perez MRN: [id number] Date of Birth: Dec 04, 1985  Subjective/Objective:                  Pt from home with self care. Pt uninsured but employed, Pt will need abx at DC, previously able to pay but unsure what abx will be at this time.   Action/Plan: Pt will DC home with self care over weekend. MATCH voucher filled out and placed on pt chart to be given to pt at DC.   Expected Discharge Date:       06/27/2015           Expected Discharge Plan:  Home/Self Care  In-House Referral:  NA  Discharge planning Services  CM Consult, MATCH Program  Post Acute Care Choice:  NA Choice offered to:  NA  DME Arranged:    DME Agency:     HH Arranged:    HH Agency:     Status of Service:  Completed, signed off  Medicare Important Message Given:    Date Medicare IM Given:    Medicare IM give by:    Date Additional Medicare IM Given:    Additional Medicare Important Message give by:     If discussed at Long Length of Stay Meetings, dates discussed:    Additional Comments:  Malcolm MetroChildress, Ascencion Coye Demske, RN 06/26/2015, 4:38 PM

## 2015-06-27 LAB — BASIC METABOLIC PANEL
ANION GAP: 6 (ref 5–15)
BUN: 7 mg/dL (ref 6–20)
CO2: 21 mmol/L — AB (ref 22–32)
Calcium: 8.5 mg/dL — ABNORMAL LOW (ref 8.9–10.3)
Chloride: 108 mmol/L (ref 101–111)
Creatinine, Ser: 0.83 mg/dL (ref 0.44–1.00)
GFR calc Af Amer: >60 mL/min (ref >60)
GFR calc non Af Amer: >60 mL/min (ref >60)
GLUCOSE: 85 mg/dL (ref 65–99)
POTASSIUM: 3.6 mmol/L (ref 3.5–5.1)
Sodium: 135 mmol/L (ref 135–145)

## 2015-06-27 LAB — CBC WITH DIFFERENTIAL/PLATELET
BASOS ABS: 0 10*3/uL (ref 0.0–0.1)
Basophils Relative: 0 %
EOS PCT: 1 %
Eosinophils Absolute: 0.1 10*3/uL (ref 0.0–0.7)
HEMATOCRIT: 20.5 % — AB (ref 36.0–46.0)
Hemoglobin: 6.4 g/dL — CL (ref 12.0–15.0)
LYMPHS ABS: 1.9 10*3/uL (ref 0.7–4.0)
LYMPHS PCT: 14 %
MCH: 21.5 pg — AB (ref 26.0–34.0)
MCHC: 31.2 g/dL (ref 30.0–36.0)
MCV: 68.8 fL — AB (ref 78.0–100.0)
MONO ABS: 1.2 10*3/uL — AB (ref 0.1–1.0)
Monocytes Relative: 9 %
NEUTROS ABS: 10.9 10*3/uL — AB (ref 1.7–7.7)
Neutrophils Relative %: 77 %
Platelets: 307 10*3/uL (ref 150–400)
RBC: 2.98 MIL/uL — AB (ref 3.87–5.11)
RDW: 16.7 % — AB (ref 11.5–15.5)
WBC: 14.1 10*3/uL — ABNORMAL HIGH (ref 4.0–10.5)

## 2015-06-27 LAB — PREPARE RBC (CROSSMATCH)

## 2015-06-27 LAB — ABO/RH: ABO/RH(D): B POS

## 2015-06-27 MED ORDER — SODIUM CHLORIDE 0.9 % IV SOLN
Freq: Once | INTRAVENOUS | Status: AC
  Administered 2015-06-27: 11:00:00 via INTRAVENOUS

## 2015-06-27 MED ORDER — OXYCODONE-ACETAMINOPHEN 5-325 MG PO TABS
2.0000 | ORAL_TABLET | Freq: Once | ORAL | Status: DC
  Filled 2015-06-27: qty 2

## 2015-06-27 MED ORDER — OXYCODONE-ACETAMINOPHEN 5-325 MG PO TABS
1.0000 | ORAL_TABLET | Freq: Four times a day (QID) | ORAL | Status: DC | PRN
  Administered 2015-06-27 – 2015-06-28 (×4): 2 via ORAL
  Administered 2015-06-28: 1 via ORAL
  Filled 2015-06-27 (×4): qty 2
  Filled 2015-06-27: qty 1

## 2015-06-27 NOTE — Progress Notes (Signed)
Patients temp 99.1. About to start patient 2nd unit PRBC's. Patient did spike a temp last night. Discussed with Dr. Kerry HoughMemon. Will give extra dose percocet now and start blood.

## 2015-06-27 NOTE — Progress Notes (Signed)
Patient called and requested her pain medication, when I went in the room to medicate patient she was asleep and I was unable to wake her after calling her name several times. Pain medication returned.

## 2015-06-27 NOTE — Progress Notes (Signed)
[facility] PROGRESS NOTE  Robin LevansJacklyn M Perez WUJ:[id number] DOB: 1986/08/19 DOA: 06/25/2015 PCP: No PCP Per Patient  Assessment/Plan: 1. Sepsis secondary to cellulitis. WBC 14.1 Lactic acid now WNL following IV fluids. Currently afebrile but had a fever of 100.4 yesterday. BC pending. Continue current treatments. 2. RLE cellulitis, currently on broad spectrum abx.Will likely transition to PO abx tomorrow if continues to improve. RLE US negative for DVT. Continue to keep RLE elevated.  3. Iron deficiency anemia with hx of noncompliance with iron pills.  Hgb 6.4 today. Possibly worsened by infection/hemodilution. Recommended transfusion however patient was hesitant at first.  Will give IV iron. Hgb not improved and patient is now agreeable to transfusion. 4. Hypokalemia, repleted. 5. Hyponatremia, repleted.  6. Tobacco abuse, counseled on the importance of cessation  Code Status:Full DVT prophylaxis: SCDs Family Communication: Discussed with patient who understands and has no concerns at this time. Disposition Plan: Anticipate discharge tomorrow     Consultants:    Procedures:    Antibiotics:  Vancomycin 11/3>>  HPI/Subjective: Feeling sleepy, leg doesn't hurt bad but worse when ambulatory. Complains of headache.   Objective: Filed Vitals:   06/27/15 0704  BP: 116/66  Pulse: 82  Temp: 98 F (36.7 C)  Resp: 20    Intake/Output Summary (Last 24 hours) at 06/27/15 0817 Last data filed at 06/26/15 1803  Gross per 24 hour  Intake 2618.25 ml  Output    800 ml  Net 1818.25 ml   Filed Weights   06/25/15 1713 06/25/15 2211  Weight: 99.338 kg (219 lb) 99.335 kg (218 lb 15.9 oz)    Exam:  General: appears calm, looks comfortable, lying in bed. Cardiovascular: Regular rate and rhythm, no m/r/g  Respiratory: CTAB, No w/r/r Abdomen: soft, ntnd, bowel sounds present Musculoskeletal: Erythema in RLE improving, still has significant edema. No draining wounds noted.    Data Reviewed: Basic Metabolic Panel:  Recent Labs Lab 06/25/15 1730 06/26/15 0520  NA 134* 134*  K 3.5 3.3*  CL 102 107  CO2 22 22  GLUCOSE 95 83  BUN 13 8  CREATININE 1.01* 0.85  CALCIUM 9.1 8.0*   Liver Function Tests:  Recent Labs Lab 06/26/15 0520  AST 13*  ALT 6*  ALKPHOS 48  BILITOT 0.6  PROT 6.5  ALBUMIN 2.7*    CBC:  Recent Labs Lab 06/25/15 1730 06/26/15 0520  WBC 21.1* 20.3*  NEUTROABS 19.9*  --   HGB 8.1* 6.6*  HCT 26.5* 21.0*  MCV 68.3* 67.5*  PLT 438* 360      Recent Results (from the past 240 hour(s))  Blood culture (routine x 2)     Status: None (Preliminary result)   Collection Time: 06/25/15  7:35 PM  Result Value Ref Range Status   Specimen Description BLOOD LEFT ARM  Final   Special Requests BOTTLES DRAWN AEROBIC AND ANAEROBIC 6CC  Final   Culture NO GROWTH < 12 HOURS  Final   Report Status PENDING  Incomplete  Blood culture (routine x 2)     Status: None (Preliminary result)   Collection Time: 06/25/15  7:41 PM  Result Value Ref Range Status   Specimen Description BLOOD LEFT ARM  Final   Special Requests BOTTLES DRAWN AEROBIC AND ANAEROBIC 6CC  Final   Culture NO GROWTH < 12 HOURS  Final   Report Status PENDING  Incomplete     Studies: [place] Venous Img Lower Unilateral Right  06/25/2015  CLINICAL DATA:  Right lower extremity cellulitis for 3  weeks. Nausea today. Pain, edema, and color changes. EXAM: Right LOWER EXTREMITY VENOUS DOPPLER ULTRASOUND TECHNIQUE: Gray-scale sonography with graded compression, as well as color Doppler and duplex ultrasound were performed to evaluate the lower extremity deep venous systems from the level of the common femoral vein and including the common femoral, femoral, profunda femoral, popliteal and calf veins including the posterior tibial, peroneal and gastrocnemius veins when visible. The superficial great saphenous vein was also interrogated. Spectral Doppler was utilized to evaluate flow at rest and  with distal augmentation maneuvers in the common femoral, femoral and popliteal veins. COMPARISON:  None. FINDINGS: Contralateral Common Femoral Vein: Respiratory phasicity is normal and symmetric with the symptomatic side. No evidence of thrombus. Normal compressibility. Common Femoral Vein: No evidence of thrombus. Normal compressibility, respiratory phasicity and response to augmentation. Saphenofemoral Junction: No evidence of thrombus. Normal compressibility and flow on color Doppler imaging. Profunda Femoral Vein: No evidence of thrombus. Normal compressibility and flow on color Doppler imaging. Femoral Vein: No evidence of thrombus. Normal compressibility, respiratory phasicity and response to augmentation. Popliteal Vein: No evidence of thrombus. Normal compressibility, respiratory phasicity and response to augmentation. Calf Veins: No evidence of thrombus. Normal compressibility and flow on color Doppler imaging. Superficial Great Saphenous Vein: No evidence of thrombus. Normal compressibility and flow on color Doppler imaging. Venous Reflux:  None. Other Findings: Soft tissue edema demonstrated in the calf region. No discrete fluid collections demonstrated. IMPRESSION: No evidence of deep venous thrombosis. Electronically Signed   By: Burman Nieves M.D.   On: 06/25/2015 18:09    Scheduled Meds: . ferrous sulfate  325 mg Oral BID WC  . sodium chloride  3 mL Intravenous Q12H  . vancomycin  1,000 mg Intravenous Q8H   Continuous Infusions: . sodium chloride 125 mL/hr at 06/27/15 0518    Principal Problem:   Lower extremity cellulitis Active Problems:   Hematuria, undiagnosed cause   Chronic sinusitis   Tobacco abuse   Sepsis (HCC)    Time spent: 20 minutes   Jehanzeb Memon. MD [facility] Pager [phone]. If 7PM-7AM, please contact night-coverage at [email or web address], password [facility] 06/27/2015, 8:17 AM  LOS: 2 days     By signing my name below, I, Burnett Harry, attest  that this documentation has been prepared under the direction and in the presence of [facility]. MD Electronically Signed: Burnett Harry, Scribe. 06/27/2015 10:42 AM  I, Dr. Erick Blinks, personally performed the services described in this documentaiton. All medical record entries made by the scribe were at my direction and in my presence. I have reviewed the chart and agree that the record reflects my personal performance and is accurate and complete  Erick Blinks, MD, 06/27/2015 10:55 AM

## 2015-06-27 NOTE — Progress Notes (Signed)
CRITICAL VALUE ALERT  Critical value received:  HGB 6.4  Date of notification:  06/27/2015  Time of notification:  0935  Critical value read back: yes  Nurse who received alert:  Sondra BargesJ Tomiko Schoon  MD notified (1st page):  Memon  Time of first page:  0940  MD notified (2nd page):  Time of second page:  Responding MD:  Kerry HoughMemon  Time MD responded:  [phone]

## 2015-06-27 NOTE — Progress Notes (Signed)
Patient refused for labs to be drawn this morning. Discussed with patient that we needed these labs in order to monitor her kidney function with antibiotics she is receiving. Patient agreed to have blood drawn.

## 2015-06-28 DIAGNOSIS — R319 Hematuria, unspecified: Secondary | ICD-10-CM

## 2015-06-28 LAB — CBC
HEMATOCRIT: 26.8 % — AB (ref 36.0–46.0)
HEMOGLOBIN: 8.7 g/dL — AB (ref 12.0–15.0)
MCH: 22.8 pg — AB (ref 26.0–34.0)
MCHC: 32.5 g/dL (ref 30.0–36.0)
MCV: 70.2 fL — AB (ref 78.0–100.0)
Platelets: 293 10*3/uL (ref 150–400)
RBC: 3.82 MIL/uL — AB (ref 3.87–5.11)
RDW: 19 % — ABNORMAL HIGH (ref 11.5–15.5)
WBC: 10.7 10*3/uL — ABNORMAL HIGH (ref 4.0–10.5)

## 2015-06-28 LAB — BASIC METABOLIC PANEL
Anion gap: 9 (ref 5–15)
BUN: 8 mg/dL (ref 6–20)
CO2: 21 mmol/L — ABNORMAL LOW (ref 22–32)
Calcium: 8.8 mg/dL — ABNORMAL LOW (ref 8.9–10.3)
Chloride: 107 mmol/L (ref 101–111)
Creatinine, Ser: 0.83 mg/dL (ref 0.44–1.00)
GFR calc non Af Amer: >60 mL/min (ref >60)
GLUCOSE: 92 mg/dL (ref 65–99)
Potassium: 3.6 mmol/L (ref 3.5–5.1)
SODIUM: 137 mmol/L (ref 135–145)

## 2015-06-28 LAB — TYPE AND SCREEN
ABO/RH(D): B POS
ANTIBODY SCREEN: NEGATIVE
Unit division: 0
Unit division: 0

## 2015-06-28 LAB — VANCOMYCIN, TROUGH: Vancomycin Tr: 17 ug/mL (ref 10.0–20.0)

## 2015-06-28 MED ORDER — DOXYCYCLINE HYCLATE 100 MG PO TABS: 100.0000 mg | ORAL_TABLET | Freq: Two times a day (BID) | ORAL | Status: DC

## 2015-06-28 MED ORDER — OXYCODONE-ACETAMINOPHEN 5-325 MG PO TABS: 1.0000 | ORAL_TABLET | Freq: Four times a day (QID) | ORAL | Status: DC | PRN

## 2015-06-28 NOTE — Progress Notes (Signed)
Discharge instructions given on medications,and follow up visits,patient verbalized understanding. Prescriptions sent with patient. No c/o pain or discomfort noted. Accompanied by staff to an awaiting vehicle 

## 2015-06-28 NOTE — Progress Notes (Signed)
ANTIBIOTIC CONSULT NOTE -  Pharmacy Consult for Vancomycin Indication: cellulitis   No Known Allergies  Patient Measurements: Height: 5\' 7"  (170.2 cm) Weight: 218 lb 15.9 oz (99.335 kg) IBW/kg (Calculated) : 61.6   Vital Signs: Temp: 98.3 F (36.8 C) (11/06 0640) Temp Source: Oral (11/06 0640) BP: 144/89 mmHg (11/06 0640) Pulse Rate: 72 (11/06 0640) Intake/Output from previous day: 11/05 0701 - 11/06 0700 In: 2955 [P.O.:600; I.V.:1500; Blood:655; IV Piggyback:200] Out: 550 [Urine:550] Intake/Output from this shift:    Labs:  Recent Labs  06/26/15 0520 06/27/15 0750 06/28/15 0936  WBC 20.3* 14.1* 10.7*  HGB 6.6* 6.4* 8.7*  PLT 360 307 293  CREATININE 0.85 0.83 0.83   Estimated Creatinine Clearance: 123.3 mL/min (by C-G formula based on Cr of 0.83).  Recent Labs  06/28/15 0936  [facility]     Microbiology: Recent Results (from the past 720 hour(s))  Urine culture     Status: None   Collection Time: 06/04/15 11:00 PM  Result Value Ref Range Status   Specimen Description URINE, CLEAN CATCH  Final   Special Requests Normal  Final   Culture   Final    NO GROWTH 1 DAY [facility]    Report Status 06/06/2015 FINAL  Final  Blood culture (routine x 2)     Status: None (Preliminary result)   Collection Time: 06/25/15  7:35 PM  Result Value Ref Range Status   Specimen Description BLOOD LEFT ARM  Final   Special Requests BOTTLES DRAWN AEROBIC AND ANAEROBIC 6CC  Final   Culture NO GROWTH 2 DAYS  Final   Report Status PENDING  Incomplete  Blood culture (routine x 2)     Status: None (Preliminary result)   Collection Time: 06/25/15  7:41 PM  Result Value Ref Range Status   Specimen Description BLOOD LEFT ARM  Final   Special Requests BOTTLES DRAWN AEROBIC AND ANAEROBIC 6CC  Final   Culture NO GROWTH 2 DAYS  Final   Report Status PENDING  Incomplete    Medical History: Past Medical History  Diagnosis Date  . Vertigo   . Asthma   .  Incisional pain 05/09/2013    Will rx cipro    Medications:  Prescriptions prior to admission  Medication Sig Dispense Refill Last Dose  . acetaminophen (TYLENOL) 500 MG tablet Take 500 mg by mouth every 6 (six) hours as needed for mild pain.    06/25/2015 at Unknown time  . ferrous sulfate 325 (65 FE) MG tablet Take 1 tablet (325 mg total) by mouth 2 (two) times daily with a meal. 60 tablet 0 06/24/2015 at Unknown time   Assessment: Okay for Protocol, received initial dose in ED. Vancomycin at goal this AM Renal function stable  Goal of Therapy:  Vancomycin trough level 10-15 mcg/ml  Plan:  Continue Vancomycin 1gm IV every 8 hours. Continue to monitor renal LOT per MD Vancomycin level weekly or as indicated   Raquel JamesPittman, Aamori Mcmasters Bennett 06/28/2015,10:30 AM

## 2015-06-28 NOTE — Discharge Summary (Signed)
Physician Discharge Summary  Robin Perez YNW:[id number] DOB: [birth year] DOA: 06/25/2015  PCP: Toma Deiters, MD  Admit date: 06/25/2015 Discharge date: 06/28/2015  Time spent: 35 minutes  Recommendations for Outpatient Follow-up:  1. Follow up with PCP in 1-2 weeks. 2. Encouraged to keep RLE elevated  Discharge Diagnoses:  Principal Problem:   Lower extremity cellulitis Active Problems:   Hematuria, undiagnosed cause   Chronic sinusitis   Tobacco abuse   Sepsis (HCC) Iron deficiency anemia  Discharge Condition: Improved  Diet recommendation: Regular  Filed Weights   06/25/15 1713 06/25/15 2211  Weight: 99.338 kg (219 lb) 99.335 kg (218 lb 15.9 oz)    History of present illness:  29 y.o. female with a hx of  microcytic anemia,  chronic sinusitis, and cellulitis presented with worsening cellulitis. The patient was admitted for 3 days last month because of gross hematuria, hemoglobin 7.9 g/dL and right lower extremity cellulitis. She was treated with iron for anemia and ceftriaxone/discharged on Ceftin for cellulitis which she states she completed. Since discharge the patient has had no improvement in her right leg redness or swelling and has had increasing pain, prompting her visit to the ED. While in the ED, she was febrile to 103F, tachycardic, and with lactic acid level of 2.23 mmol/L and leukocytosis to 21K/uL. US of the RLE was negative for DVT. She was admitted for further management.    Hospital Course:  29 y/o female presented with sepsis secondary to RLE cellulitis. She was treated with IVF and IV Vancomycin with significant improvement. She was transitioned to oral doxycycline upon discharge. Her WBC has trended down and her lactic acid normalized following IVF. She is afebrile on discharge. Her BC reveals no growth to date however final results are pending. RLE [place] was negative for DVT. She continues to have significant RLE edema but no tenderness, erythema, or  warmth. Patient encouraged to keep her RLE elevated.   Iron deficiency anemia with hx of noncompliance with iron pills. Possibly worsened by infection/hemodilution. Hgb stable at 8.7 following transfusion of 2U PRBCs. She also received one dose of IV iron. She has been advised to continue iron supplementation as outpatient. On her last admission, she did have workup including negative stool for occult blood test and CT abd/pelvis that was unremarkable. She was found to have hematuria. She does admit to having heavy menstrual cycles which is likely contributing to her anemia. Further workup for anemia can be done as an outpatient.   1. Hypokalemia, repleted. 2. Hyponatremia, repleted.  3. Tobacco abuse, counseled on the importance of cessation  Procedures:  Transfused 2U of PRBCs 11/5  Consultations:  none  Discharge Exam: Filed Vitals:   06/28/15 0640  BP: 144/89  Pulse: 72  Temp: 98.3 F (36.8 C)  Resp: 20     General: NAD, looks comfortable  Cardiovascular: RRR, S1, S2   Respiratory: clear bilaterally, No wheezing, rales or rhonchi  Abdomen: soft, non tender, no distention , bowel sounds normal  Musculoskeletal: significant edema in RLE without erythema or warmth  Discharge Instructions   Discharge Instructions    Diet - low sodium heart healthy    Complete by:  As directed      Increase activity slowly    Complete by:  As directed           Current Discharge Medication List    START taking these medications   Details  doxycycline (VIBRA-TABS) 100 MG tablet Take 1 tablet (100 mg total)  by mouth 2 (two) times daily. Qty: 12 tablet, Refills: 0    oxyCODONE-acetaminophen (PERCOCET/ROXICET) 5-325 MG tablet Take 1-2 tablets by mouth every 6 (six) hours as needed for severe pain. Qty: 30 tablet, Refills: 0      CONTINUE these medications which have NOT CHANGED   Details  acetaminophen (TYLENOL) 500 MG tablet Take 500 mg by mouth every 6 (six) hours as needed  for mild pain.     ferrous sulfate 325 (65 FE) MG tablet Take 1 tablet (325 mg total) by mouth 2 (two) times daily with a meal. Qty: 60 tablet, Refills: 0       No Known Allergies Follow-up Information    Follow up with Toma DeitersXAJE A HASANAJ, MD.   Specialty:  Internal Medicine   Why:  call office for 1 week appointment.   Contact information:   [address] [place] [id number] [phone]        The results of significant diagnostics from this hospitalization (including imaging, microbiology, ancillary and laboratory) are listed below for reference.    Significant Diagnostic Studies: Dg Chest 2 View  06/05/2015  CLINICAL DATA:  Shortness of breath. EXAM: CHEST  2 VIEW COMPARISON:  February 18, 2015. FINDINGS: The heart size and mediastinal contours are within normal limits. Both lungs are clear. No pneumothorax or pleural effusion is noted. The visualized skeletal structures are unremarkable. IMPRESSION: No active cardiopulmonary disease. Electronically Signed   By: Lupita RaiderJames  Green Jr, M.D.   On: 06/05/2015 10:53   Ct Head Wo Contrast  06/05/2015  CLINICAL DATA:  Traumatic injury 2-3 months ago. Fever and headache beginning yesterday. EXAM: CT HEAD WITHOUT CONTRAST TECHNIQUE: Contiguous axial images were obtained from the base of the skull through the vertex without intravenous contrast. COMPARISON:  05/08/2015.  03/16/2012. FINDINGS: The brain has a normal appearance without evidence of malformation, atrophy, old or acute infarction, mass lesion, hemorrhage, hydrocephalus or extra-axial collection. The calvarium is unremarkable. The paranasal sinuses, middle ears and mastoids are clear. IMPRESSION: Normal head CT Electronically Signed   By: Paulina FusiMark  Shogry M.D.   On: 06/05/2015 13:12   Ct Abdomen Pelvis W Contrast  06/05/2015  CLINICAL DATA:  29 year old female with acute bilateral flank pain and gross hematuria. EXAM: CT ABDOMEN AND PELVIS WITH CONTRAST TECHNIQUE: Multidetector CT imaging  of the abdomen and pelvis was performed using the standard protocol following bolus administration of intravenous contrast. CONTRAST:  100mL OMNIPAQUE IOHEXOL 300 MG/ML  SOLN COMPARISON:  07/15/2014 and prior CTs FINDINGS: Lower chest:  Unremarkable Hepatobiliary: The liver and gallbladder are unremarkable. There is no evidence of biliary dilatation. Pancreas: Unremarkable Spleen: Unremarkable Adrenals/Urinary Tract: The kidneys are unremarkable. There is no evidence of hydronephrosis, urinary calculi or renal mass. The adrenal glands and bladder are unremarkable. Stomach/Bowel: Unremarkable. There is no evidence of bowel obstruction or focal bowel wall thickening. The appendix is normal. Vascular/Lymphatic: Unremarkable. No enlarged lymph nodes or abdominal aortic aneurysm. Reproductive: A left tubal ligation clip is identified. The right tubal ligation clip is no longer visualized. The uterus and adnexal regions are otherwise unremarkable. Other: No free fluid, abscess or pneumoperitoneum. A small umbilical hernia containing fat is identified. Musculoskeletal: No acute or suspicious abnormality. IMPRESSION: No acute abnormalities. Unremarkable kidneys and bladder. No findings to suggest a cause for this patient's hematuria. Small umbilical hernia containing fat. Electronically Signed   By: Harmon PierJeffrey  Hu M.D.   On: 06/05/2015 13:34   [place]ore[place] Renal  06/05/2015  CLINICAL DATA:  Michaell CowingGross  hematuria.  Bilateral flank pain. EXAM: RENAL / URINARY TRACT ULTRASOUND COMPLETE COMPARISON:  CT scan of July 15, 2014. FINDINGS: Right Kidney: Length: 13.4 cm. Slightly increased echogenicity of renal parenchyma is noted. No mass or hydronephrosis visualized. Left Kidney: Length: 13.7 cm. Slightly increased echogenicity of renal parenchyma is noted. No mass or hydronephrosis visualized. Bladder: Appears normal for degree of bladder distention. Right ureteral jet is noted. IMPRESSION: Slightly increased echogenicity of renal parenchyma  is noted bilaterally suggesting medical renal disease. No hydronephrosis or renal obstruction is noted. Electronically Signed   By: Lupita Raider, M.D.   On: 06/05/2015 10:27   US Venous Img Lower Unilateral Right  06/25/2015  CLINICAL DATA:  Right lower extremity cellulitis for 3 weeks. Nausea today. Pain, edema, and color changes. EXAM: Right LOWER EXTREMITY VENOUS DOPPLER ULTRASOUND TECHNIQUE: Gray-scale sonography with graded compression, as well as color Doppler and duplex ultrasound were performed to evaluate the lower extremity deep venous systems from the level of the common femoral vein and including the common femoral, femoral, profunda femoral, popliteal and calf veins including the posterior tibial, peroneal and gastrocnemius veins when visible. The superficial great saphenous vein was also interrogated. Spectral Doppler was utilized to evaluate flow at rest and with distal augmentation maneuvers in the common femoral, femoral and popliteal veins. COMPARISON:  None. FINDINGS: Contralateral Common Femoral Vein: Respiratory phasicity is normal and symmetric with the symptomatic side. No evidence of thrombus. Normal compressibility. Common Femoral Vein: No evidence of thrombus. Normal compressibility, respiratory phasicity and response to augmentation. Saphenofemoral Junction: No evidence of thrombus. Normal compressibility and flow on color Doppler imaging. Profunda Femoral Vein: No evidence of thrombus. Normal compressibility and flow on color Doppler imaging. Femoral Vein: No evidence of thrombus. Normal compressibility, respiratory phasicity and response to augmentation. Popliteal Vein: No evidence of thrombus. Normal compressibility, respiratory phasicity and response to augmentation. Calf Veins: No evidence of thrombus. Normal compressibility and flow on color Doppler imaging. Superficial Great Saphenous Vein: No evidence of thrombus. Normal compressibility and flow on color Doppler imaging.  Venous Reflux:  None. Other Findings: Soft tissue edema demonstrated in the calf region. No discrete fluid collections demonstrated. IMPRESSION: No evidence of deep venous thrombosis. Electronically Signed   By: Burman Nieves M.D.   On: 06/25/2015 18:09    Microbiology: Recent Results (from the past 240 hour(s))  Blood culture (routine x 2)     Status: None (Preliminary result)   Collection Time: 06/25/15  7:35 PM  Result Value Ref Range Status   Specimen Description BLOOD LEFT ARM  Final   Special Requests BOTTLES DRAWN AEROBIC AND ANAEROBIC 6CC  Final   Culture NO GROWTH 2 DAYS  Final   Report Status PENDING  Incomplete  Blood culture (routine x 2)     Status: None (Preliminary result)   Collection Time: 06/25/15  7:41 PM  Result Value Ref Range Status   Specimen Description BLOOD LEFT ARM  Final   Special Requests BOTTLES DRAWN AEROBIC AND ANAEROBIC 6CC  Final   Culture NO GROWTH 2 DAYS  Final   Report Status PENDING  Incomplete     Labs: Basic Metabolic Panel:  Recent Labs Lab 06/25/15 1730 06/26/15 0520 06/27/15 0750 06/28/15 0936  NA 134* 134* 135 137  K 3.5 3.3* 3.6 3.6  CL 102 107 108 107  CO2 22 22 21* 21*  GLUCOSE 95 83 85 92  BUN CREATININE 1.01* 0.85 0.83 0.83  CALCIUM 9.1 8.0*  8.5* 8.8*   Liver Function Tests:  Recent Labs Lab 06/26/15 0520  AST 13*  ALT 6*  ALKPHOS 48  BILITOT 0.6  PROT 6.5  ALBUMIN 2.7*   CBC:  Recent Labs Lab 06/25/15 1730 06/26/15 0520 06/27/15 0750 06/28/15 0936  WBC 21.1* 20.3* 14.1* 10.7*  NEUTROABS 19.9*  --  10.9*  --   HGB 8.1* 6.6* 6.4* 8.7*  HCT 26.5* 21.0* 20.5* 26.8*  MCV 68.3* 67.5* 68.8* 70.2*  PLT 438* 360 307 293     Signed:  Jewell Ryans. MD  [facility] 06/28/2015, 1:52 PM    By signing my name below, I, Burnett Harry, attest that this documentation has been prepared under the direction and in the presence of [facility]. MD Electronically Signed: Burnett Harry, Scribe. 06/28/2015  I, Dr. Erick Blinks, personally performed the services described in this documentaiton. All medical record entries made by the scribe were at my direction and in my presence. I have reviewed the chart and agree that the record reflects my personal performance and is accurate and complete  Erick Blinks, MD, 06/28/2015 1:52 PM

## 2015-06-28 NOTE — Progress Notes (Signed)
Late entry for 06/28/15 at around 0300. Patient called out and requested to get in the shower. When the NT got supplies together to go and assist patient with the shower, the patient was asleep. I will pass it on the day shift to assist patient with a shower today.

## 2015-07-03 LAB — CULTURE, BLOOD (ROUTINE X 2)
CULTURE: NO GROWTH
Culture: NO GROWTH

## 2015-10-06 ENCOUNTER — Emergency Department (HOSPITAL_COMMUNITY)
Admission: EM | Admit: 2015-10-06 | Discharge: 2015-10-06 | Disposition: A | Discharge status: Home / Self Care | Payer: Medicaid Other | Attending: Emergency Medicine | Admitting: Emergency Medicine

## 2015-10-06 ENCOUNTER — Encounter (HOSPITAL_COMMUNITY): Payer: Self-pay | Admitting: Emergency Medicine

## 2015-10-06 DIAGNOSIS — Z79899 Other long term (current) drug therapy: Secondary | ICD-10-CM | POA: Diagnosis not present

## 2015-10-06 DIAGNOSIS — J329 Chronic sinusitis, unspecified: Secondary | ICD-10-CM | POA: Diagnosis not present

## 2015-10-06 DIAGNOSIS — H6501 Acute serous otitis media, right ear: Secondary | ICD-10-CM | POA: Insufficient documentation

## 2015-10-06 DIAGNOSIS — J45909 Unspecified asthma, uncomplicated: Secondary | ICD-10-CM | POA: Diagnosis not present

## 2015-10-06 DIAGNOSIS — J339 Nasal polyp, unspecified: Secondary | ICD-10-CM | POA: Insufficient documentation

## 2015-10-06 DIAGNOSIS — F17210 Nicotine dependence, cigarettes, uncomplicated: Secondary | ICD-10-CM | POA: Insufficient documentation

## 2015-10-06 DIAGNOSIS — Z792 Long term (current) use of antibiotics: Secondary | ICD-10-CM | POA: Insufficient documentation

## 2015-10-06 DIAGNOSIS — R0981 Nasal congestion: Secondary | ICD-10-CM | POA: Diagnosis present

## 2015-10-06 MED ORDER — AMOXICILLIN-POT CLAVULANATE 875-125 MG PO TABS: 1.0000 | ORAL_TABLET | Freq: Two times a day (BID) | ORAL | Status: DC

## 2015-10-06 MED ORDER — CETIRIZINE HCL 10 MG PO TABS: 10.0000 mg | ORAL_TABLET | Freq: Every day | ORAL | Status: DC

## 2015-10-06 MED ORDER — MOMETASONE FUROATE 50 MCG/ACT NA SUSP: 2.0000 | Freq: Two times a day (BID) | NASAL | Status: DC

## 2015-10-06 NOTE — ED Notes (Signed)
Patient with no complaints at this time. Respirations even and unlabored. Skin warm/dry. Discharge instructions reviewed with patient at this time. Patient given opportunity to voice concerns/ask questions. Patient discharged at this time and left Emergency Department with steady gait.   

## 2015-10-06 NOTE — ED Notes (Signed)
PA at bedside.

## 2015-10-06 NOTE — Discharge Instructions (Signed)
Otitis Media, Adult °Otitis media is redness, soreness, and puffiness (swelling) in the space just behind your eardrum (middle ear). It may be caused by allergies or infection. It often happens along with a cold. °HOME CARE °· Take your medicine as told. Finish it even if you start to feel better. °· Only take over-the-counter or prescription medicines for pain, discomfort, or fever as told by your doctor. °· Follow up with your doctor as told. °GET HELP IF: °· You have otitis media only in one ear, or bleeding from your nose, or both. °· You notice a lump on your neck. °· You are not getting better in 3-5 days. °· You feel worse instead of better. °GET HELP RIGHT AWAY IF:  °· You have pain that is not helped with medicine. °· You have puffiness, redness, or pain around your ear. °· You get a stiff neck. °· You cannot move part of your face (paralysis). °· You notice that the bone behind your ear hurts when you touch it. °MAKE SURE YOU:  °· Understand these instructions. °· Will watch your condition. °· Will get help right away if you are not doing well or get worse. °  °This information is not intended to replace advice given to you by your health care provider. Make sure you discuss any questions you have with your health care provider. °  °Document Released: 01/25/2008 Document Revised: 08/29/2014 Document Reviewed: 03/05/2013 °Elsevier Interactive Patient Education ©2016 Elsevier Inc. ° ° °

## 2015-10-06 NOTE — ED Provider Notes (Signed)
CSN: [id number]     Arrival date & time 10/06/15  0803 History   First MD Initiated Contact with Patient 10/06/15 [phone]     Chief Complaint  Patient presents with  . Nasal Congestion     (Consider location/radiation/quality/duration/timing/severity/associated sxs/prior Treatment) HPI   Robin Perez is a 30 y.o. female who presents to the Emergency Department complaining of generalized body aches, malaise, sinus pressure and right ear pain and decreased hearing from the right ear.  Symptoms began last evening.  She reports having recurrent sinus problems and chronic nasal congestion, but current sx's are worse than usual.  She has not taken any medications for her symptoms.  She denies cough, chest pain, shortness of breath, sore throat or fever.    Past Medical History  Diagnosis Date  . Vertigo   . Asthma   . Incisional pain 05/09/2013    Will rx cipro   Past Surgical History  Procedure Laterality Date  . Cesarean section    . Cesarean section    . Cesarean section with bilateral tubal ligation N/A 04/23/2013    Procedure: CESAREAN SECTION WITH BILATERAL TUBAL LIGATION;  Surgeon: Willodean Rosenthal, MD;  Location: [facility];  Service: Obstetrics;  Laterality: N/A;   History reviewed. No pertinent family history. Social History  Substance Use Topics  . Smoking status: Current Every Day Smoker -- 0.25 packs/day    Types: Cigarettes  . Smokeless tobacco: Never Used  . Alcohol Use: No   OB History    Gravida Para Term Preterm AB TAB SAB Ectopic Multiple Living   Review of Systems  Constitutional: Positive for chills and fatigue. Negative for fever, activity change and appetite change.  HENT: Positive for congestion, ear discharge, ear pain, rhinorrhea and sinus pressure. Negative for facial swelling, sore throat and trouble swallowing.   Eyes: Negative for visual disturbance.  Respiratory: Negative for cough, chest tightness, shortness of breath, wheezing  and stridor.   Cardiovascular: Negative for chest pain.  Gastrointestinal: Negative for nausea, vomiting and abdominal pain.  Musculoskeletal: Positive for myalgias. Negative for neck pain and neck stiffness.  Skin: Negative.  Negative for rash.  Neurological: Negative for dizziness, weakness, numbness and headaches.  Hematological: Negative for adenopathy.  Psychiatric/Behavioral: Negative for confusion.  All other systems reviewed and are negative.     Allergies  Review of patient's allergies indicates no known allergies.  Home Medications   Prior to Admission medications   Medication Sig Start Date End Date Taking? Authorizing Provider  acetaminophen (TYLENOL) 500 MG tablet Take 500 mg by mouth every 6 (six) hours as needed for mild pain.     Historical Provider, MD  doxycycline (VIBRA-TABS) 100 MG tablet Take 1 tablet (100 mg total) by mouth 2 (two) times daily. 06/28/15   Erick Blinks, MD  ferrous sulfate 325 (65 FE) MG tablet Take 1 tablet (325 mg total) by mouth 2 (two) times daily with a meal. 06/06/15   Jerald Kief, MD  oxyCODONE-acetaminophen (PERCOCET/ROXICET) 5-325 MG tablet Take 1-2 tablets by mouth every 6 (six) hours as needed for severe pain. 06/28/15   Erick Blinks, MD   BP 142/104 mmHg  Pulse 74  Temp(Src) 98 F (36.7 C) (Oral)  Resp 18  Ht  (1.702 m)  Wt 97.523 kg  BMI 33.67 kg/m2  SpO2 99%  LMP 09/28/2015 Physical Exam  Constitutional: She is oriented to person, place, and time.  She appears well-developed and well-nourished. No distress.  HENT:  Head: Normocephalic and atraumatic.  Right Ear: There is drainage. Tympanic membrane is bulging. Tympanic membrane is not perforated. A middle ear effusion is present.  Left Ear: Tympanic membrane and ear canal normal.  Nose: Mucosal edema and rhinorrhea present. Right sinus exhibits frontal sinus tenderness. Left sinus exhibits frontal sinus tenderness.  Mouth/Throat: Uvula is midline, oropharynx is  clear and moist and mucous membranes are normal. No trismus in the jaw. No uvula swelling. No oropharyngeal exudate, posterior oropharyngeal edema, posterior oropharyngeal erythema or tonsillar abscesses.  Bilateral nasal polyps with left > right.  Bulging of the right TM, with air fluid levels and loss of landmarks.   Eyes: Conjunctivae are normal.  Neck: Normal range of motion and phonation normal. Neck supple. No Brudzinski's sign and no Kernig's sign noted.  Cardiovascular: Normal rate, regular rhythm, normal heart sounds and intact distal pulses.   No murmur heard. Pulmonary/Chest: Effort normal and breath sounds normal. No respiratory distress. She has no wheezes. She has no rales.  Abdominal: Soft. She exhibits no distension. There is no tenderness. There is no rebound and no guarding.  Musculoskeletal: She exhibits no edema.  Lymphadenopathy:    She has no cervical adenopathy.  Neurological: She is alert and oriented to person, place, and time. She exhibits normal muscle tone. Coordination normal.  Skin: Skin is warm and dry.  Nursing note and vitals reviewed.   ED Course  Procedures (including critical care time) Labs Review Labs Reviewed - No data to display  Imaging Review No results found. I have personally reviewed and evaluated these images and lab results as part of my medical decision-making.   EKG Interpretation None      MDM   Final diagnoses:  Right acute serous otitis media, recurrence not specified  Recurrent sinusitis, unspecified chronicity, unspecified location  Nasal polyps    Vitals stable.  Pt is non-toxic appearing.  Right serous OM.  Will treat with augmentin and nasal spray.  Pt appears stable for d/c and agrees to arrange f/u with ENT regarding her nasal polyps.      Pauline Aus, PA-C 10/08/15 1251  Zadie Rhine, MD 10/08/15 1356

## 2015-10-06 NOTE — ED Notes (Signed)
PT c/o nasal congestion and sinus pressure with generalized malise and body aches that started this am. PT denies any fever, urinary symptoms or cough.

## 2015-10-31 ENCOUNTER — Encounter (HOSPITAL_BASED_OUTPATIENT_CLINIC_OR_DEPARTMENT_OTHER): Payer: Self-pay | Admitting: Emergency Medicine

## 2015-10-31 ENCOUNTER — Emergency Department (HOSPITAL_BASED_OUTPATIENT_CLINIC_OR_DEPARTMENT_OTHER)
Admission: EM | Admit: 2015-10-31 | Discharge: 2015-10-31 | Disposition: A | Discharge status: Home / Self Care | Payer: Medicaid Other | Attending: Emergency Medicine | Admitting: Emergency Medicine

## 2015-10-31 DIAGNOSIS — J45909 Unspecified asthma, uncomplicated: Secondary | ICD-10-CM | POA: Insufficient documentation

## 2015-10-31 DIAGNOSIS — R102 Pelvic and perineal pain: Secondary | ICD-10-CM | POA: Diagnosis not present

## 2015-10-31 DIAGNOSIS — R300 Dysuria: Secondary | ICD-10-CM | POA: Insufficient documentation

## 2015-10-31 DIAGNOSIS — F17210 Nicotine dependence, cigarettes, uncomplicated: Secondary | ICD-10-CM | POA: Insufficient documentation

## 2015-10-31 NOTE — ED Notes (Signed)
Patient reports that she is having pain to her bilateral lower pelvic region. The patient reports that she has some burning with urination.

## 2015-10-31 NOTE — ED Notes (Signed)
Patient called x 3 in the waiting room. No one answered x 3. The patient not visualized in the waiting room.

## 2015-10-31 NOTE — ED Notes (Signed)
Patient was asked for a urine, and she was unable to provide.

## 2015-12-09 ENCOUNTER — Encounter (HOSPITAL_COMMUNITY): Payer: Self-pay | Admitting: Emergency Medicine

## 2015-12-09 ENCOUNTER — Emergency Department (HOSPITAL_COMMUNITY): Payer: Medicaid Other

## 2015-12-09 ENCOUNTER — Emergency Department (HOSPITAL_COMMUNITY)
Admission: EM | Admit: 2015-12-09 | Discharge: 2015-12-09 | Disposition: A | Discharge status: Home / Self Care | Payer: Medicaid Other | Attending: Emergency Medicine | Admitting: Emergency Medicine

## 2015-12-09 DIAGNOSIS — Y999 Unspecified external cause status: Secondary | ICD-10-CM | POA: Diagnosis not present

## 2015-12-09 DIAGNOSIS — J45909 Unspecified asthma, uncomplicated: Secondary | ICD-10-CM | POA: Insufficient documentation

## 2015-12-09 DIAGNOSIS — S70311A Abrasion, right thigh, initial encounter: Secondary | ICD-10-CM | POA: Diagnosis not present

## 2015-12-09 DIAGNOSIS — Z87891 Personal history of nicotine dependence: Secondary | ICD-10-CM | POA: Insufficient documentation

## 2015-12-09 DIAGNOSIS — S01512A Laceration without foreign body of oral cavity, initial encounter: Secondary | ICD-10-CM | POA: Diagnosis not present

## 2015-12-09 DIAGNOSIS — S70312A Abrasion, left thigh, initial encounter: Secondary | ICD-10-CM | POA: Diagnosis not present

## 2015-12-09 DIAGNOSIS — S0990XA Unspecified injury of head, initial encounter: Secondary | ICD-10-CM

## 2015-12-09 DIAGNOSIS — S0993XA Unspecified injury of face, initial encounter: Secondary | ICD-10-CM | POA: Diagnosis present

## 2015-12-09 DIAGNOSIS — Y9389 Activity, other specified: Secondary | ICD-10-CM | POA: Insufficient documentation

## 2015-12-09 DIAGNOSIS — Y9289 Other specified places as the place of occurrence of the external cause: Secondary | ICD-10-CM | POA: Insufficient documentation

## 2015-12-09 DIAGNOSIS — T07XXXA Unspecified multiple injuries, initial encounter: Secondary | ICD-10-CM

## 2015-12-09 LAB — POC URINE PREG, ED: PREG TEST UR: NEGATIVE

## 2015-12-09 MED ORDER — IBUPROFEN 800 MG PO TABS
800.0000 mg | ORAL_TABLET | Freq: Once | ORAL | Status: AC
  Administered 2015-12-09: 800 mg via ORAL
  Filled 2015-12-09: qty 1

## 2015-12-09 MED ORDER — HYDROCODONE-ACETAMINOPHEN 5-325 MG PO TABS: 1.0000 | ORAL_TABLET | Freq: Four times a day (QID) | ORAL | Status: DC | PRN

## 2015-12-09 MED ORDER — IBUPROFEN 800 MG PO TABS: 800.0000 mg | ORAL_TABLET | Freq: Three times a day (TID) | ORAL | Status: DC | PRN

## 2015-12-09 MED ORDER — CEPHALEXIN 500 MG PO CAPS
500.0000 mg | ORAL_CAPSULE | Freq: Once | ORAL | Status: AC
  Administered 2015-12-09: 500 mg via ORAL
  Filled 2015-12-09: qty 1

## 2015-12-09 MED ORDER — CEPHALEXIN 500 MG PO CAPS: 500.0000 mg | ORAL_CAPSULE | Freq: Two times a day (BID) | ORAL | Status: DC

## 2015-12-09 MED ORDER — ONDANSETRON 4 MG PO TBDP
4.0000 mg | ORAL_TABLET | Freq: Once | ORAL | Status: AC
  Administered 2015-12-09: 4 mg via ORAL
  Filled 2015-12-09: qty 1

## 2015-12-09 MED ORDER — LIDOCAINE VISCOUS 2 % MT SOLN: 10.0000 mL | OROMUCOSAL | Status: DC | PRN

## 2015-12-09 MED ORDER — OXYCODONE-ACETAMINOPHEN 5-325 MG PO TABS
2.0000 | ORAL_TABLET | Freq: Once | ORAL | Status: AC
  Administered 2015-12-09: 2 via ORAL
  Filled 2015-12-09: qty 2
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
## 2015-12-09 NOTE — ED Notes (Signed)
[employer] at bedside 

## 2015-12-09 NOTE — ED Notes (Signed)
Patient placed in gown. Not able to give urine specimen at this time. Patient very tearful at this time.

## 2015-12-09 NOTE — ED Notes (Signed)
[employer] notified

## 2015-12-09 NOTE — Discharge Instructions (Signed)
Head Injury, Adult You have a head injury. Headaches and throwing up (vomiting) are common after a head injury. It should be easy to wake up from sleeping. Sometimes you must stay in the hospital. Most problems happen within the first 24 hours. Side effects may occur up to 7-10 days after the injury.  WHAT ARE THE TYPES OF HEAD INJURIES? Head injuries can be as minor as a bump. Some head injuries can be more severe. More severe head injuries include:  A jarring injury to the brain (concussion).  A bruise of the brain (contusion). This mean there is bleeding in the brain that can cause swelling.  A cracked skull (skull fracture).  Bleeding in the brain that collects, clots, and forms a bump (hematoma). WHEN SHOULD I GET HELP RIGHT AWAY?   You are confused or sleepy.  You cannot be woken up.  You feel sick to your stomach (nauseous) or keep throwing up (vomiting).  Your dizziness or unsteadiness is getting worse.  You have very bad, lasting headaches that are not helped by medicine. Take medicines only as told by your doctor.  You cannot use your arms or legs like normal.  You cannot walk.  You notice changes in the black spots in the center of the colored part of your eye (pupil).  You have clear or bloody fluid coming from your nose or ears.  You have trouble seeing. During the next 24 hours after the injury, you must stay with someone who can watch you. This person should get help right away (call 911 in the [place] if you start to shake and are not able to control it (have seizures), you pass out, or you are unable to wake up. HOW CAN I PREVENT A HEAD INJURY IN THE FUTURE?  Wear seat belts.  Wear a helmet while bike riding and playing sports like football.  Stay away from dangerous activities around the house. WHEN CAN I RETURN TO NORMAL ACTIVITIES AND ATHLETICS? See your doctor before doing these activities. You should not do normal activities or play contact sports  until 1 week after the following symptoms have stopped:  Headache that does not go away.  Dizziness.  Poor attention.  Confusion.  Memory problems.  Sickness to your stomach or throwing up.  Tiredness.  Fussiness.  Bothered by bright lights or loud noises.  Anxiousness or depression.  Restless sleep. MAKE SURE YOU:   Understand these instructions.  Will watch your condition.  Will get help right away if you are not doing well or get worse.   This information is not intended to replace advice given to you by your health care provider. Make sure you discuss any questions you have with your health care provider.   Document Released: 07/21/2008 Document Revised: 08/29/2014 Document Reviewed: 04/15/2013 Elsevier Interactive Patient Education 2016 Elsevier Inc.  Mouth Laceration A mouth laceration is a deep cut in the lining of your mouth (mucosa). The laceration may extend into your lip or go all of the way through your mouth and cheek. Lacerations inside your mouth may involve your tongue, the insides of your cheeks, or the upper surface of your mouth (palate). Mouth lacerations may bleed a lot because your mouth has a very rich blood supply. Mouth lacerations may need to be repaired with stitches (sutures). CAUSES Any type of facial injury can cause a mouth laceration. Common causes include:  Getting hit in the mouth.  Being in a car accident. SYMPTOMS The most common sign  of a mouth laceration is bleeding that fills the mouth. DIAGNOSIS Your health care provider can diagnose a mouth laceration by examining your mouth. Your mouth may need to be washed out (irrigated) with a sterile salt-water (saline) solution. Your health care provider may also have to remove any blood clots to determine how bad your injury is. You may need X-rays of the bones in your jaw or your face to rule out other injuries, such as dental injuries, facial fractures, or jaw  fractures. TREATMENT Treatment depends on the location and severity of your injury. Small mouth lacerations may not need treatment if bleeding has stopped. You may need sutures if:  You have a tongue laceration.  Your mouth laceration is large or deep, or it continues to bleed. If sutures are necessary, your health care provider will use absorbable sutures that dissolve as your body heals. You may also receive antibiotic medicine or a tetanus shot. HOME CARE INSTRUCTIONS  Take medicines only as directed by your health care provider.  If you were prescribed an antibiotic medicine, finish all of it even if you start to feel better.  Eat as directed by your health care provider. You may only be able to drink liquids or eat soft foods for a few days.  Rinse your mouth with a warm, salt-water rinse 4-6 times per day or as directed by your health care provider. You can make a salt-water rinse by mixing one tsp of salt into two cups of warm water.  Do not poke the sutures with your tongue. Doing that can loosen them.  Check your wound every day for signs of infection. It is normal to have a white or gray patch over your wound while it heals. Watch for:  Redness.  Swelling.  Blood or pus.  Maintain regular oral hygiene, if possible. Gently brush your teeth with a soft, nylon-bristled toothbrush 2 times per day.  Keep all follow-up visits as directed by your health care provider. This is important. SEEK MEDICAL CARE IF:  You were given a tetanus shot and have swelling, severe pain, redness, or bleeding at the injection site.  You have a fever.  Your pain is not controlled with medicine.  You have redness, swelling, or pain at your wound that is getting worse.  You have fresh bleeding or pus coming from your wound.  The edges of your wound break open.  You develop swollen, tender glands in your throat. SEEK IMMEDIATE MEDICAL CARE IF:   Your face or the area under your jaw  becomes swollen.  You have trouble breathing or swallowing.   This information is not intended to replace advice given to you by your health care provider. Make sure you discuss any questions you have with your health care provider.   Document Released: 08/08/2005 Document Revised: 12/23/2014 Document Reviewed: 07/30/2014 Elsevier Interactive Patient Education 2016 Elsevier Inc.   Mouth Laceration -  ?Eat soft foods for two to three days.  ?Rinse the mouth with water after eating.  ?Avoid spicy or salty foods until the wound is healed.  ?Avoid the use of straws (negative pressure may increase ecchymosis or bleeding at the wound site). Caregivers should also be aware of these recommendations. Wounds should heal rapidly (within three to five days).
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
## 2016-01-15 ENCOUNTER — Encounter (HOSPITAL_COMMUNITY): Payer: Self-pay | Admitting: Emergency Medicine

## 2016-01-15 ENCOUNTER — Emergency Department (HOSPITAL_COMMUNITY): Payer: Medicaid Other

## 2016-01-15 ENCOUNTER — Emergency Department (HOSPITAL_COMMUNITY)
Admission: EM | Admit: 2016-01-15 | Discharge: 2016-01-15 | Disposition: A | Discharge status: Home / Self Care | Payer: Medicaid Other | Attending: Emergency Medicine | Admitting: Emergency Medicine

## 2016-01-15 DIAGNOSIS — Y929 Unspecified place or not applicable: Secondary | ICD-10-CM | POA: Diagnosis not present

## 2016-01-15 DIAGNOSIS — X58XXXA Exposure to other specified factors, initial encounter: Secondary | ICD-10-CM | POA: Insufficient documentation

## 2016-01-15 DIAGNOSIS — J45909 Unspecified asthma, uncomplicated: Secondary | ICD-10-CM | POA: Diagnosis not present

## 2016-01-15 DIAGNOSIS — M545 Low back pain: Secondary | ICD-10-CM | POA: Diagnosis present

## 2016-01-15 DIAGNOSIS — Y999 Unspecified external cause status: Secondary | ICD-10-CM | POA: Insufficient documentation

## 2016-01-15 DIAGNOSIS — Z87891 Personal history of nicotine dependence: Secondary | ICD-10-CM | POA: Insufficient documentation

## 2016-01-15 DIAGNOSIS — Y939 Activity, unspecified: Secondary | ICD-10-CM | POA: Insufficient documentation

## 2016-01-15 DIAGNOSIS — S39012A Strain of muscle, fascia and tendon of lower back, initial encounter: Secondary | ICD-10-CM | POA: Diagnosis not present

## 2016-01-15 LAB — URINE MICROSCOPIC-ADD ON

## 2016-01-15 LAB — URINALYSIS, ROUTINE W REFLEX MICROSCOPIC
Bilirubin Urine: NEGATIVE
Glucose, UA: NEGATIVE mg/dL
Ketones, ur: NEGATIVE mg/dL
NITRITE: NEGATIVE
PH: 6 (ref 5.0–8.0)
Protein, ur: NEGATIVE mg/dL
SPECIFIC GRAVITY, URINE: 1.015 (ref 1.005–1.030)

## 2016-01-15 LAB — POC URINE PREG, ED: PREG TEST UR: NEGATIVE

## 2016-01-15 MED ORDER — CYCLOBENZAPRINE HCL 10 MG PO TABS: 10.0000 mg | ORAL_TABLET | Freq: Three times a day (TID) | ORAL | Status: DC | PRN

## 2016-01-15 MED ORDER — IBUPROFEN 800 MG PO TABS
800.0000 mg | ORAL_TABLET | Freq: Once | ORAL | Status: AC
  Administered 2016-01-15: 800 mg via ORAL
  Filled 2016-01-15: qty 1

## 2016-01-15 MED ORDER — HYDROCODONE-ACETAMINOPHEN 5-325 MG PO TABS: ORAL_TABLET | ORAL | Status: DC

## 2016-01-15 MED ORDER — OXYCODONE-ACETAMINOPHEN 5-325 MG PO TABS
1.0000 | ORAL_TABLET | Freq: Once | ORAL | Status: AC
  Administered 2016-01-15: 1 via ORAL
  Filled 2016-01-15: qty 1

## 2016-01-15 NOTE — Discharge Instructions (Signed)

## 2016-01-15 NOTE — ED Provider Notes (Signed)
CSN: [id number]     Arrival date & time 01/15/16  1833 History   First MD Initiated Contact with Patient 01/15/16 1853     Chief Complaint  Patient presents with  . Back Pain     (Consider location/radiation/quality/duration/timing/severity/associated sxs/prior Treatment) HPI   Robin Perez is a 30 y.o. female who presents to the Emergency Department complaining of gradually worsening left low back pain for 3 days.  She describes a constant pain to her back that radiates into her buttock and is worse with sitting and standing, improves with rest and lying supine.  She has been taking ibuprofen without relief.  She denies fever, abdominal pain, vomiting, urine or bowel changes, recent injury, numbness or weakness of the LE's.     Past Medical History  Diagnosis Date  . Vertigo   . Asthma   . Incisional pain 05/09/2013    Will rx cipro   Past Surgical History  Procedure Laterality Date  . Cesarean section    . Cesarean section    . Cesarean section with bilateral tubal ligation N/A 04/23/2013    Procedure: CESAREAN SECTION WITH BILATERAL TUBAL LIGATION;  Surgeon: Willodean Rosenthalarolyn Harraway-Smith, MD;  Location: [facility];  Service: Obstetrics;  Laterality: N/A;   No family history on file. Social History  Substance Use Topics  . Smoking status: Former Smoker -- 0.25 packs/day    Types: Cigarettes  . Smokeless tobacco: Never Used  . Alcohol Use: No   OB History    Gravida Para Term Preterm AB TAB SAB Ectopic Multiple Living   3 3 3       3      Review of Systems  Constitutional: Negative for fever.  Respiratory: Negative for shortness of breath.   Gastrointestinal: Negative for vomiting, abdominal pain and constipation.  Genitourinary: Negative for dysuria, hematuria, flank pain, decreased urine volume and difficulty urinating.  Musculoskeletal: Positive for back pain. Negative for joint swelling.  Skin: Negative for rash.  Neurological: Negative for weakness and numbness.  All  other systems reviewed and are negative.     Allergies  Review of patient's allergies indicates no known allergies.  Home Medications   Prior to Admission medications   Medication Sig Start Date End Date Taking? Authorizing Provider  cephALEXin (KEFLEX) 500 MG capsule Take 1 capsule (500 mg total) by mouth 2 (two) times daily. 12/09/15   Kristen N Ward, DO  HYDROcodone-acetaminophen (NORCO/VICODIN) 5-325 MG tablet Take 1-2 tablets by mouth every 6 (six) hours as needed. 12/09/15   Kristen N Ward, DO  ibuprofen (ADVIL,MOTRIN) 800 MG tablet Take 1 tablet (800 mg total) by mouth every 8 (eight) hours as needed for mild pain. 12/09/15   Kristen N Ward, DO  lidocaine (XYLOCAINE) 2 % solution Use as directed 10 mLs in the mouth or throat every 4 (four) hours as needed for mouth pain. 12/09/15   Kristen N Ward, DO   BP 142/81 mmHg  Pulse 76  Temp(Src) 98.2 F (36.8 C) (Oral)  Resp 18  Ht 5\' 8"  (1.727 m)  Wt 101.878 kg  BMI 34.16 kg/m2  SpO2 100%  LMP 01/11/2016 Physical Exam  Constitutional: She is oriented to person, place, and time. She appears well-developed and well-nourished. No distress.  HENT:  Head: Normocephalic and atraumatic.  Neck: Normal range of motion. Neck supple.  Cardiovascular: Normal rate, regular rhythm, normal heart sounds and intact distal pulses.   No murmur heard. Pulmonary/Chest: Effort normal and breath sounds normal. No respiratory distress.  Abdominal: Soft. She exhibits no distension. There is no tenderness. There is no rebound and no guarding.  Musculoskeletal: She exhibits tenderness. She exhibits no edema.       Lumbar back: She exhibits tenderness and pain. She exhibits normal range of motion, no swelling, no deformity, no laceration and normal pulse.  ttp of the lower spine and left lumbar paraspinal muscles. DP pulses are brisk and symmetrical.  Distal sensation intact.  Pt has 5/5 strength against resistance of bilateral lower extremities.      Neurological: She is alert and oriented to person, place, and time. She has normal strength. No sensory deficit. She exhibits normal muscle tone. Coordination and gait normal.  Reflex Scores:      Patellar reflexes are 2+ on the right side and 2+ on the left side.      Achilles reflexes are 2+ on the right side and 2+ on the left side. Skin: Skin is warm and dry. No rash noted.  Nursing note and vitals reviewed.   ED Course  Procedures (including critical care time) Labs Review Labs Reviewed  URINALYSIS, ROUTINE W REFLEX MICROSCOPIC (NOT AT [facility]) - Abnormal; Notable for the following:    Hgb urine dipstick LARGE (*)    Leukocytes, UA TRACE (*)    All other components within normal limits  URINE MICROSCOPIC-ADD ON - Abnormal; Notable for the following:    Squamous Epithelial / LPF 0-5 (*)    Bacteria, UA RARE (*)    All other components within normal limits  URINE CULTURE  POC URINE PREG, ED    Imaging Review Dg Lumbar Spine Complete  01/15/2016  CLINICAL DATA:  Low back pain, increased with movement for the past 3 days. No recent injury. EXAM: LUMBAR SPINE - COMPLETE 4+ VIEW COMPARISON:  Abdomen pelvis CT dated 06/05/2015. FINDINGS: Five non-rib-bearing lumbar vertebrae with unfused L1 transverse processes. No fractures, pars defects or subluxations. IMPRESSION: Normal examination. Electronically Signed   By: Beckie Salts M.D.   On: 01/15/2016 20:24   I have personally reviewed and evaluated these images and lab results as part of my medical decision-making.   EKG Interpretation None      MDM   Final diagnoses:  Lumbar strain, initial encounter    Pt is well appearing.  Vitals stable.  Ambulates with a steady gait.  No focal neurological deficits, no concerning sx's for emergent neurological or infectious process.    Pt feeling better after medications.  Likely musculoskeletal.  Appears stable for d/c, agrees to PMD f/u if needed.     Pauline Aus, PA-C 01/16/16  0111  Vanetta Mulders, MD 01/16/16 1737

## 2016-01-15 NOTE — ED Notes (Signed)
Pt c/o LT lower back pain that increases with movement x 3 days. Denies recent injury. Pt ambulatory. Denies urinary symptoms.

## 2016-01-18 LAB — URINE CULTURE

## 2016-01-29 ENCOUNTER — Encounter (HOSPITAL_COMMUNITY): Payer: Self-pay

## 2016-01-29 DIAGNOSIS — Z791 Long term (current) use of non-steroidal anti-inflammatories (NSAID): Secondary | ICD-10-CM | POA: Insufficient documentation

## 2016-01-29 DIAGNOSIS — A5901 Trichomonal vulvovaginitis: Secondary | ICD-10-CM | POA: Diagnosis not present

## 2016-01-29 DIAGNOSIS — N898 Other specified noninflammatory disorders of vagina: Secondary | ICD-10-CM | POA: Diagnosis present

## 2016-01-29 DIAGNOSIS — J45909 Unspecified asthma, uncomplicated: Secondary | ICD-10-CM | POA: Insufficient documentation

## 2016-01-29 DIAGNOSIS — Z87891 Personal history of nicotine dependence: Secondary | ICD-10-CM | POA: Insufficient documentation

## 2016-01-29 DIAGNOSIS — N760 Acute vaginitis: Secondary | ICD-10-CM | POA: Insufficient documentation

## 2016-01-29 NOTE — ED Notes (Signed)
Pt states she wants to be checked for an std, states she is having lower back pain, lower abd pain, vaginal discharge, denies dysuria.

## 2016-01-30 ENCOUNTER — Emergency Department (HOSPITAL_COMMUNITY)
Admission: EM | Admit: 2016-01-30 | Discharge: 2016-01-30 | Disposition: A | Discharge status: Home / Self Care | Payer: Medicaid Other | Attending: Emergency Medicine | Admitting: Emergency Medicine

## 2016-01-30 DIAGNOSIS — B9689 Other specified bacterial agents as the cause of diseases classified elsewhere: Secondary | ICD-10-CM

## 2016-01-30 DIAGNOSIS — A5901 Trichomonal vulvovaginitis: Secondary | ICD-10-CM

## 2016-01-30 DIAGNOSIS — N760 Acute vaginitis: Secondary | ICD-10-CM

## 2016-01-30 LAB — WET PREP, GENITAL
SPERM: NONE SEEN
Yeast Wet Prep HPF POC: NONE SEEN

## 2016-01-30 LAB — POC URINE PREG, ED: Preg Test, Ur: NEGATIVE

## 2016-01-30 MED ORDER — METRONIDAZOLE 500 MG PO TABS
500.0000 mg | ORAL_TABLET | Freq: Once | ORAL | Status: AC
  Administered 2016-01-30: 500 mg via ORAL
  Filled 2016-01-30: qty 1

## 2016-01-30 MED ORDER — ACETAMINOPHEN 500 MG PO TABS
1000.0000 mg | ORAL_TABLET | Freq: Once | ORAL | Status: AC
  Administered 2016-01-30: 1000 mg via ORAL
  Filled 2016-01-30: qty 2

## 2016-01-30 MED ORDER — IBUPROFEN 400 MG PO TABS
600.0000 mg | ORAL_TABLET | Freq: Once | ORAL | Status: AC
  Administered 2016-01-30: 600 mg via ORAL
  Filled 2016-01-30: qty 2

## 2016-01-30 MED ORDER — METRONIDAZOLE 500 MG PO TABS: 500.0000 mg | ORAL_TABLET | Freq: Two times a day (BID) | ORAL | Status: DC

## 2016-01-30 NOTE — Discharge Instructions (Signed)
Bacterial Vaginosis Bacterial vaginosis is a vaginal infection that occurs when the normal balance of bacteria in the vagina is disrupted. It results from an overgrowth of certain bacteria. This is the most common vaginal infection in women of childbearing age. Treatment is important to prevent complications, especially in pregnant women, as it can cause a premature delivery. CAUSES  Bacterial vaginosis is caused by an increase in harmful bacteria that are normally present in smaller amounts in the vagina. Several different kinds of bacteria can cause bacterial vaginosis. However, the reason that the condition develops is not fully understood. RISK FACTORS Certain activities or behaviors can put you at an increased risk of developing bacterial vaginosis, including:  Having a new sex partner or multiple sex partners.  Douching.  Using an intrauterine device (IUD) for contraception. Women do not get bacterial vaginosis from toilet seats, bedding, swimming pools, or contact with objects around them. SIGNS AND SYMPTOMS  Some women with bacterial vaginosis have no signs or symptoms. Common symptoms include:  Grey vaginal discharge.  A fishlike odor with discharge, especially after sexual intercourse.  Itching or burning of the vagina and vulva.  Burning or pain with urination. DIAGNOSIS  Your health care provider will take a medical history and examine the vagina for signs of bacterial vaginosis. A sample of vaginal fluid may be taken. Your health care provider will look at this sample under a microscope to check for bacteria and abnormal cells. A vaginal pH test may also be done.  TREATMENT  Bacterial vaginosis may be treated with antibiotic medicines. These may be given in the form of a pill or a vaginal cream. A second round of antibiotics may be prescribed if the condition comes back after treatment. Because bacterial vaginosis increases your risk for sexually transmitted diseases, getting  treated can help reduce your risk for chlamydia, gonorrhea, HIV, and herpes. HOME CARE INSTRUCTIONS   Only take over-the-counter or prescription medicines as directed by your health care provider.  If antibiotic medicine was prescribed, take it as directed. Make sure you finish it even if you start to feel better.  Tell all sexual partners that you have a vaginal infection. They should see their health care provider and be treated if they have problems, such as a mild rash or itching.  During treatment, it is important that you follow these instructions:  Avoid sexual activity or use condoms correctly.  Do not douche.  Avoid alcohol as directed by your health care provider.  Avoid breastfeeding as directed by your health care provider. SEEK MEDICAL CARE IF:   Your symptoms are not improving after 3 days of treatment.  You have increased discharge or pain.  You have a fever. MAKE SURE YOU:   Understand these instructions.  Will watch your condition.  Will get help right away if you are not doing well or get worse. FOR MORE INFORMATION  Centers for Disease Control and Prevention, Division of STD Prevention: www.cdc.gov/std American Sexual Health Association (ASHA): www.ashastd.org    This information is not intended to replace advice given to you by your health care provider. Make sure you discuss any questions you have with your health care provider.   Document Released: 08/08/2005 Document Revised: 08/29/2014 Document Reviewed: 03/20/2013 Elsevier Interactive Patient Education 2016 Elsevier Inc. Trichomoniasis Trichomoniasis is an infection caused by an organism called Trichomonas. The infection can affect both women and men. In women, the outer female genitalia and the vagina are affected. In men, the penis is mainly affected,   but the prostate and other reproductive organs can also be involved. Trichomoniasis is a sexually transmitted infection (STI) and is most often  passed to another person through sexual contact.  RISK FACTORS  Having unprotected sexual intercourse.  Having sexual intercourse with an infected partner. SIGNS AND SYMPTOMS  Symptoms of trichomoniasis in women include:  Abnormal gray-green frothy vaginal discharge.  Itching and irritation of the vagina.  Itching and irritation of the area outside the vagina. Symptoms of trichomoniasis in men include:   Penile discharge with or without pain.  Pain during urination. This results from inflammation of the urethra. DIAGNOSIS  Trichomoniasis may be found during a Pap test or physical exam. Your health care provider may use one of the following methods to help diagnose this infection:  Testing the pH of the vagina with a test tape.  Using a vaginal swab test that checks for the Trichomonas organism. A test is available that provides results within a few minutes.  Examining a urine sample.  Testing vaginal secretions. Your health care provider may test you for other STIs, including HIV. TREATMENT   You may be given medicine to fight the infection. Women should inform their health care provider if they could be or are pregnant. Some medicines used to treat the infection should not be taken during pregnancy.  Your health care provider may recommend over-the-counter medicines or creams to decrease itching or irritation.  Your sexual partner will need to be treated if infected.  Your health care provider may test you for infection again 3 months after treatment. HOME CARE INSTRUCTIONS   Take medicines only as directed by your health care provider.  Take over-the-counter medicine for itching or irritation as directed by your health care provider.  Do not have sexual intercourse while you have the infection.  Women should not douche or wear tampons while they have the infection.  Discuss your infection with your partner. Your partner may have gotten the infection from you, or you  may have gotten it from your partner.  Have your sex partner get examined and treated if necessary.  Practice safe, informed, and protected sex.  See your health care provider for other STI testing. SEEK MEDICAL CARE IF:   You still have symptoms after you finish your medicine.  You develop abdominal pain.  You have pain when you urinate.  You have bleeding after sexual intercourse.  You develop a rash.  Your medicine makes you sick or makes you throw up (vomit). MAKE SURE YOU:  Understand these instructions.  Will watch your condition.  Will get help right away if you are not doing well or get worse.   This information is not intended to replace advice given to you by your health care provider. Make sure you discuss any questions you have with your health care provider.   Document Released: 02/01/2001 Document Revised: 08/29/2014 Document Reviewed: 05/20/2013 Elsevier Interactive Patient Education 2016 Elsevier Inc.  

## 2016-01-30 NOTE — ED Provider Notes (Signed)
CSN: [id number]     Arrival date & time 01/29/16  2306 History  By signing my name below, I, Linna DarnerRussell Turner, attest that this documentation has been prepared under the direction and in the presence of physician practitioner, Azalia BilisKevin Yannis Broce, MD. Electronically Signed: Linna Darnerussell Turner, Scribe. 01/30/2016. 12:22 AM.     Chief Complaint  Patient presents with  . S74.5     The history is provided by the patient. No language interpreter was used.     HPI Comments: Robin Perez is a 30 y.o. female who presents to the Emergency Department complaining of sudden onset, constant, vaginal discharge for the last two weeks. She states that her discharge is not malodorous. Pt endorses associated lower back pain, abdominal pain, and pruritic, painful exterior vaginal rash since onset. She denies new recent sexual partners. She further denies nausea, vomiting, or any other associated symptoms.  Past Medical History  Diagnosis Date  . Vertigo   . Asthma   . Incisional pain 05/09/2013    Will rx cipro   Past Surgical History  Procedure Laterality Date  . Cesarean section    . Cesarean section    . Cesarean section with bilateral tubal ligation N/A 04/23/2013    Procedure: CESAREAN SECTION WITH BILATERAL TUBAL LIGATION;  Surgeon: Willodean Rosenthalarolyn Harraway-Smith, MD;  Location: [facility];  Service: Obstetrics;  Laterality: N/A;   No family history on file. Social History  Substance Use Topics  . Smoking status: Former Smoker -- 0.25 packs/day    Types: Cigarettes  . Smokeless tobacco: Never Used  . Alcohol Use: No   OB History    Gravida Para Term Preterm AB TAB SAB Ectopic Multiple Living   3 3 3       3      Review of Systems  A complete 10 system review of systems was obtained and all systems are negative except as noted in the HPI and PMH.   Allergies  Review of patient's allergies indicates no known allergies.  Home Medications   Prior to Admission medications   Medication Sig Start Date End Date  Taking? Authorizing Provider  cephALEXin (KEFLEX) 500 MG capsule Take 1 capsule (500 mg total) by mouth 2 (two) times daily. 12/09/15   Kristen N Ward, DO  cyclobenzaprine (FLEXERIL) 10 MG tablet Take 1 tablet (10 mg total) by mouth 3 (three) times daily as needed. 01/15/16   Tammy Triplett, PA-C  HYDROcodone-acetaminophen (NORCO/VICODIN) 5-325 MG tablet Take one-two tabs po q 4-6 hrs prn pain 01/15/16   Tammy Triplett, PA-C  ibuprofen (ADVIL,MOTRIN) 800 MG tablet Take 1 tablet (800 mg total) by mouth every 8 (eight) hours as needed for mild pain. 12/09/15   Kristen N Ward, DO  lidocaine (XYLOCAINE) 2 % solution Use as directed 10 mLs in the mouth or throat every 4 (four) hours as needed for mouth pain. 12/09/15   Kristen N Ward, DO   BP 147/99 mmHg  Pulse 79  Temp(Src) 98.2 F (36.8 C) (Oral)  Resp 16  Ht 5\' 7"  (1.702 m)  Wt 222 lb (100.699 kg)  BMI 34.76 kg/m2  SpO2 100%  LMP 01/11/2016 Physical Exam  Constitutional: She is oriented to person, place, and time. She appears well-developed and well-nourished.  HENT:  Head: Normocephalic.  Eyes: EOM are normal.  Neck: Normal range of motion.  Pulmonary/Chest: Effort normal.  Abdominal: She exhibits no distension.  Genitourinary:  Scant foul-smelling vaginal discharge.  No cervical motion tenderness.  Cervix normal in appearance.  Normal  external genitalia.  Musculoskeletal: Normal range of motion.  Neurological: She is alert and oriented to person, place, and time.  Psychiatric: She has a normal mood and affect.  Nursing note and vitals reviewed.   ED Course  Procedures (including critical care time)  DIAGNOSTIC STUDIES: Oxygen Saturation is 100% on RA, normal by my interpretation.    COORDINATION OF CARE: 12:22 AM Discussed treatment plan with pt at bedside and pt agreed to plan.  Labs Review Labs Reviewed  WET PREP, GENITAL - Abnormal; Notable for the following:    WBC, Wet Prep HPF POC MODERATE (*)    All other components  within normal limits  HIV ANTIBODY (ROUTINE TESTING)  RPR  POC URINE PREG, ED  GC/CHLAMYDIA PROBE AMP ([place]) NOT AT [facility]    Imaging Review No results found. I have personally reviewed and evaluated these images and lab results as part of my medical decision-making.   EKG Interpretation None      MDM   Final diagnoses:  Bacterial vaginosis  Trichomonal vaginitis    Patient be treated for bacterial vaginosis as well as trichomonas.  Primary care follow-up.  I personally performed the services described in this documentation, which was scribed in my presence. The recorded information has been reviewed and is accurate.       Azalia Bilis, MD 01/30/16 [id number]

## 2016-01-31 LAB — RPR: RPR: NONREACTIVE

## 2016-01-31 LAB — HIV ANTIBODY (ROUTINE TESTING W REFLEX): HIV SCREEN 4TH GENERATION: NONREACTIVE

## 2016-02-01 ENCOUNTER — Telehealth (HOSPITAL_BASED_OUTPATIENT_CLINIC_OR_DEPARTMENT_OTHER): Payer: Self-pay | Admitting: Emergency Medicine

## 2016-02-01 LAB — GC/CHLAMYDIA PROBE AMP (~~LOC~~) NOT AT ARMC
CHLAMYDIA, DNA PROBE: NEGATIVE
NEISSERIA GONORRHEA: NEGATIVE

## 2016-02-03 ENCOUNTER — Telehealth (HOSPITAL_BASED_OUTPATIENT_CLINIC_OR_DEPARTMENT_OTHER): Payer: Self-pay | Admitting: Emergency Medicine

## 2016-05-30 ENCOUNTER — Encounter (HOSPITAL_COMMUNITY): Payer: Self-pay | Admitting: Emergency Medicine

## 2016-05-30 ENCOUNTER — Emergency Department (HOSPITAL_COMMUNITY)
Admission: EM | Admit: 2016-05-30 | Discharge: 2016-05-30 | Disposition: A | Discharge status: Home / Self Care | Payer: Medicaid Other | Attending: Emergency Medicine | Admitting: Emergency Medicine

## 2016-05-30 DIAGNOSIS — H60311 Diffuse otitis externa, right ear: Secondary | ICD-10-CM | POA: Insufficient documentation

## 2016-05-30 DIAGNOSIS — Z87891 Personal history of nicotine dependence: Secondary | ICD-10-CM | POA: Diagnosis not present

## 2016-05-30 DIAGNOSIS — H6501 Acute serous otitis media, right ear: Secondary | ICD-10-CM | POA: Diagnosis not present

## 2016-05-30 DIAGNOSIS — J45909 Unspecified asthma, uncomplicated: Secondary | ICD-10-CM | POA: Diagnosis not present

## 2016-05-30 DIAGNOSIS — Z791 Long term (current) use of non-steroidal anti-inflammatories (NSAID): Secondary | ICD-10-CM | POA: Diagnosis not present

## 2016-05-30 DIAGNOSIS — H9201 Otalgia, right ear: Secondary | ICD-10-CM | POA: Diagnosis present

## 2016-05-30 MED ORDER — AMOXICILLIN 500 MG PO CAPS: 500.0000 mg | ORAL_CAPSULE | Freq: Three times a day (TID) | ORAL | 0 refills | Status: DC

## 2016-05-30 MED ORDER — HYDROCODONE-ACETAMINOPHEN 5-325 MG PO TABS
1.0000 | ORAL_TABLET | Freq: Once | ORAL | Status: AC
  Administered 2016-05-30: 1 via ORAL
  Filled 2016-05-30: qty 1

## 2016-05-30 MED ORDER — IBUPROFEN 400 MG PO TABS
600.0000 mg | ORAL_TABLET | Freq: Once | ORAL | Status: AC
  Administered 2016-05-30: 600 mg via ORAL
  Filled 2016-05-30: qty 2

## 2016-05-30 MED ORDER — AMOXICILLIN 250 MG PO CAPS
500.0000 mg | ORAL_CAPSULE | Freq: Once | ORAL | Status: AC
  Administered 2016-05-30: 500 mg via ORAL
  Filled 2016-05-30: qty 2

## 2016-05-30 MED ORDER — CIPROFLOXACIN-DEXAMETHASONE 0.3-0.1 % OT SUSP: 4.0000 [drp] | Freq: Two times a day (BID) | OTIC | 0 refills | Status: AC

## 2016-05-30 MED ORDER — DEXAMETHASONE SODIUM PHOSPHATE 10 MG/ML IJ SOLN
10.0000 mg | Freq: Once | INTRAMUSCULAR | Status: AC
  Administered 2016-05-30: 10 mg via INTRAMUSCULAR
  Filled 2016-05-30: qty 1

## 2016-05-30 NOTE — Discharge Instructions (Signed)
Take ibuprofen 600 mg every 6 hrs for pain, Take tylenol 1000 mg every 6 hrs for pain and fever. If you have breathing difficulty, tongue swelling, throat swelling return to ER immediately.  If you were given medicines take as directed.  If you are on coumadin or contraceptives realize their levels and effectiveness is altered by many different medicines.  If you have any reaction (rash, tongues swelling, other) to the medicines stop taking and see a physician.    If your blood pressure was elevated in the ER make sure you follow up for management with a primary doctor or return for chest pain, shortness of breath or stroke symptoms.  Please follow up as directed and return to the ER or see a physician for new or worsening symptoms.  Thank you. Vitals:   05/30/16 0901  BP: (!) 147/102  Pulse: 83  Resp: 16  Temp: 98 F (36.7 C)  TempSrc: Oral  SpO2: 98%  Weight: 222 lb (100.7 kg)  Height: 5\' 7"  (1.702 m)

## 2016-05-30 NOTE — ED Provider Notes (Signed)
[facility]PT Provider Note   CSN: [id number] Arrival date & time: 05/30/16  4098  By signing my name below, I, Placido Sou, attest that this documentation has been prepared under the direction and in the presence of Blane Ohara, MD. Electronically Signed: Placido Sou, ED Scribe. 05/30/16. 10:09 AM.   History   Chief Complaint Chief Complaint  Patient presents with  . Otalgia    HPI HPI Comments: Robin Perez is a 30 y.o. female who presents to the Emergency Department complaining of constant, moderate, right ear pain x 3 days. She reports associated body aches and sore throat. Her throat pain worsens when swallowing and her ear pain radiates to her right face. She denies a h/o strep throat, PTA or gastric ulcers. Pt denies trouble swallowing, cough or other associated symptoms at this time.    The history is provided by the patient. No language interpreter was used.    Past Medical History:  Diagnosis Date  . Asthma   . Incisional pain 05/09/2013   Will rx cipro  . Vertigo     Patient Active Problem List   Diagnosis Date Noted  . Lower extremity cellulitis 06/25/2015  . Sepsis (HCC) 06/25/2015  . Hematuria, undiagnosed cause 06/04/2015  . Chronic sinusitis 06/04/2015  . Cellulitis 06/04/2015  . Microcytic anemia 06/04/2015  . Tobacco abuse 06/04/2015  . Cellulitis and abscess of lower extremity 06/04/2015  . Incisional pain 05/09/2013  . Sterilization consult 03/15/2013  . History of cesarean delivery, currently pregnant 11/20/2012  . Supervision of other normal pregnancy 11/20/2012  . Asthma 10/24/2012    Past Surgical History:  Procedure Laterality Date  . CESAREAN SECTION    . CESAREAN SECTION    . CESAREAN SECTION WITH BILATERAL TUBAL LIGATION N/A 04/23/2013   Procedure: CESAREAN SECTION WITH BILATERAL TUBAL LIGATION;  Surgeon: Willodean Rosenthal, MD;  Location: [facility];  Service: Obstetrics;  Laterality: N/A;    OB History    Gravida  Para Term Preterm AB Living   3 3 3     3    SAB TAB Ectopic Multiple Live Births           3       Home Medications    Prior to Admission medications   Medication Sig Start Date End Date Taking? Authorizing Provider  cephALEXin (KEFLEX) 500 MG capsule Take 1 capsule (500 mg total) by mouth 2 (two) times daily. 12/09/15   Kristen N Ward, DO  cyclobenzaprine (FLEXERIL) 10 MG tablet Take 1 tablet (10 mg total) by mouth 3 (three) times daily as needed. 01/15/16   Tammy Triplett, PA-C  HYDROcodone-acetaminophen (NORCO/VICODIN) 5-325 MG tablet Take one-two tabs po q 4-6 hrs prn pain 01/15/16   Tammy Triplett, PA-C  ibuprofen (ADVIL,MOTRIN) 800 MG tablet Take 1 tablet (800 mg total) by mouth every 8 (eight) hours as needed for mild pain. 12/09/15   Kristen N Ward, DO  lidocaine (XYLOCAINE) 2 % solution Use as directed 10 mLs in the mouth or throat every 4 (four) hours as needed for mouth pain. 12/09/15   Kristen N Ward, DO  metroNIDAZOLE (FLAGYL) 500 MG tablet Take 1 tablet (500 mg total) by mouth 2 (two) times daily. 01/30/16   Azalia Bilis, MD    Family History History reviewed. No pertinent family history.  Social History Social History  Substance Use Topics  . Smoking status: Former Smoker    Packs/day: 0.25    Types: Cigarettes  . Smokeless tobacco: Never Used  .  Alcohol use Yes     Allergies   Review of patient's allergies indicates no known allergies.   Review of Systems Review of Systems  HENT: Positive for ear pain and sore throat. Negative for trouble swallowing.   Respiratory: Negative for cough.   Musculoskeletal: Positive for myalgias.  All other systems reviewed and are negative.  Physical Exam Updated Vital Signs BP (!) 147/102   Pulse 83   Temp 98 F (36.7 C) (Oral)   Resp 16   Ht 5\' 7"  (1.702 m)   Wt 222 lb (100.7 kg)   LMP 05/22/2016   SpO2 98%   BMI 34.77 kg/m   Physical Exam  Constitutional: She is oriented to person, place, and time. She appears  well-developed and well-nourished.  HENT:  Head: Normocephalic and atraumatic.  Mouth/Throat: Posterior oropharyngeal erythema present. No tonsillar exudate.  Mild posterior erythema. 3+ tonsils w/o exudate. No unilateral swelling.  Right ear: swollen external canal and fluid surrounding the TM. No active drainage. Difficult to assess a perforation.  No trismus. No mastoid tenderness. No significant submandibular swelling.   Eyes: EOM are normal.  Neck: Normal range of motion.  Anterior cervical adenopathy with mild tenderness   Cardiovascular: Normal rate.   Pulmonary/Chest: Effort normal. No respiratory distress.  No stridor noted.   Abdominal: Soft.  Musculoskeletal: Normal range of motion.  Lymphadenopathy:    She has cervical adenopathy.  Neurological: She is alert and oriented to person, place, and time.  Skin: Skin is warm and dry.  Psychiatric: She has a normal mood and affect.  Nursing note and vitals reviewed.  ED Treatments / Results  Labs (all labs ordered are listed, but only abnormal results are displayed) Labs Reviewed - No data to display  EKG  EKG Interpretation None       Radiology No results found.  Procedures Procedures  DIAGNOSTIC STUDIES: Oxygen Saturation is 98% on RA, normal by my interpretation.    COORDINATION OF CARE: 10:07 AM Discussed next steps with pt. Pt verbalized understanding and is agreeable with the plan.    Medications Ordered in ED Medications - No data to display   Initial Impression / Assessment and Plan / ED Course  I have reviewed the triage vital signs and the nursing notes.  Pertinent labs & imaging results that were available during my care of the patient were reviewed by me and considered in my medical decision making (see chart for details).  Clinical Course   Patient presents with worsening ear pain and cervical adenopathy. Clinical concern for otitis externa and small perforation however patient also has  significant tenderness on exam and signs of otitis media. Plan for drops and oral medications until follow-up with primary doctor and ENT.  Results and differential diagnosis were discussed with the patient/parent/guardian. Xrays were independently reviewed by myself.  Close follow up outpatient was discussed, comfortable with the plan.   Medications  dexamethasone (DECADRON) injection 10 mg (not administered)  amoxicillin (AMOXIL) capsule 500 mg (not administered)  HYDROcodone-acetaminophen (NORCO/VICODIN) 5-325 MG per tablet 1 tablet (not administered)  ibuprofen (ADVIL,MOTRIN) tablet 600 mg (not administered)    Vitals:   05/30/16 0901  BP: (!) 147/102  Pulse: 83  Resp: 16  Temp: 98 F (36.7 C)  TempSrc: Oral  SpO2: 98%  Weight: 222 lb (100.7 kg)  Height: 5\' 7"  (1.702 m)    Final diagnoses:  Acute diffuse otitis externa of right ear  Right acute serous otitis media, recurrence not specified  Final Clinical Impressions(s) / ED Diagnoses   Final diagnoses:  None    New Prescriptions New Prescriptions   No medications on file     Blane Ohara, MD 05/30/16 1016

## 2016-05-30 NOTE — ED Triage Notes (Signed)
Pt presents to the ED with complaints of right ear pain that started 2 days ago. Pt states the pain from her right ear is now starting to cause pain in the right side of her face and the pain has continued to spread down the right side of her neck. Pt also states she is having difficulty swallowing due to the pain.

## 2017-02-02 ENCOUNTER — Emergency Department (HOSPITAL_COMMUNITY)
Admission: EM | Admit: 2017-02-02 | Discharge: 2017-02-02 | Disposition: A | Discharge status: Home / Self Care | Payer: Medicaid Other | Attending: Emergency Medicine | Admitting: Emergency Medicine

## 2017-02-02 ENCOUNTER — Encounter (HOSPITAL_COMMUNITY): Payer: Self-pay | Admitting: Emergency Medicine

## 2017-02-02 DIAGNOSIS — J45909 Unspecified asthma, uncomplicated: Secondary | ICD-10-CM | POA: Insufficient documentation

## 2017-02-02 DIAGNOSIS — F17210 Nicotine dependence, cigarettes, uncomplicated: Secondary | ICD-10-CM | POA: Insufficient documentation

## 2017-02-02 DIAGNOSIS — Z79899 Other long term (current) drug therapy: Secondary | ICD-10-CM | POA: Insufficient documentation

## 2017-02-02 DIAGNOSIS — J329 Chronic sinusitis, unspecified: Secondary | ICD-10-CM

## 2017-02-02 DIAGNOSIS — B354 Tinea corporis: Secondary | ICD-10-CM | POA: Insufficient documentation

## 2017-02-02 DIAGNOSIS — J019 Acute sinusitis, unspecified: Secondary | ICD-10-CM | POA: Diagnosis not present

## 2017-02-02 DIAGNOSIS — Z791 Long term (current) use of non-steroidal anti-inflammatories (NSAID): Secondary | ICD-10-CM | POA: Diagnosis not present

## 2017-02-02 DIAGNOSIS — R0981 Nasal congestion: Secondary | ICD-10-CM | POA: Diagnosis present

## 2017-02-02 MED ORDER — PSEUDOEPHEDRINE HCL 30 MG PO TABS: 30.0000 mg | ORAL_TABLET | ORAL | 0 refills | Status: DC | PRN

## 2017-02-02 MED ORDER — CLOTRIMAZOLE-BETAMETHASONE 1-0.05 % EX CREA: TOPICAL_CREAM | CUTANEOUS | 0 refills | Status: DC

## 2017-02-02 NOTE — ED Notes (Signed)
Pt made aware to return if symptoms worsen or if any life threatening symptoms occur.   

## 2017-02-02 NOTE — ED Provider Notes (Signed)
[facility] Provider Note   CSN: [id number] Arrival date & time: 02/02/17  0704     History   Chief Complaint Chief Complaint  Patient presents with  . Nasal Congestion    HPI Robin Perez is a 31 y.o. female.  HPI Patient presents with nasal congestion and ear pressure for the last couple weeks. Also has back pain and myalgias. Also a rash on her left upper chest and also a few other spots on her body. No sick contacts. History of chronic sinusitis. No fevers. The rash is very itchy. Slight sore throat. No drainage. Has had some sinus congestion. Denies possibility of pregnancy. Past Medical History:  Diagnosis Date  . Asthma   . Incisional pain 05/09/2013   Will rx cipro  . Vertigo     Patient Active Problem List   Diagnosis Date Noted  . Lower extremity cellulitis 06/25/2015  . Sepsis (HCC) 06/25/2015  . Hematuria, undiagnosed cause 06/04/2015  . Chronic sinusitis 06/04/2015  . Cellulitis 06/04/2015  . Microcytic anemia 06/04/2015  . Tobacco abuse 06/04/2015  . Cellulitis and abscess of lower extremity 06/04/2015  . Incisional pain 05/09/2013  . Sterilization consult 03/15/2013  . History of cesarean delivery, currently pregnant 11/20/2012  . Supervision of other normal pregnancy 11/20/2012  . Asthma 10/24/2012    Past Surgical History:  Procedure Laterality Date  . CESAREAN SECTION    . CESAREAN SECTION    . CESAREAN SECTION WITH BILATERAL TUBAL LIGATION N/A 04/23/2013   Procedure: CESAREAN SECTION WITH BILATERAL TUBAL LIGATION;  Surgeon: Willodean Rosenthalarolyn Harraway-Smith, MD;  Location: [facility];  Service: Obstetrics;  Laterality: N/A;    OB History    Gravida Para Term Preterm AB Living   3 3 3     3    SAB TAB Ectopic Multiple Live Births           3       Home Medications    Prior to Admission medications   Medication Sig Start Date End Date Taking? Authorizing Provider  amoxicillin (AMOXIL) 500 MG capsule Take 1 capsule (500 mg total) by mouth 3  (three) times daily. 05/30/16   Blane OharaZavitz, Joshua, MD  cephALEXin (KEFLEX) 500 MG capsule Take 1 capsule (500 mg total) by mouth 2 (two) times daily. 12/09/15   Ward, Layla MawKristen N, DO  clotrimazole-betamethasone (LOTRISONE) cream Apply to affected area 2 times daily prn 02/02/17   Benjiman CorePickering, Anni Hocevar, MD  cyclobenzaprine (FLEXERIL) 10 MG tablet Take 1 tablet (10 mg total) by mouth 3 (three) times daily as needed. 01/15/16   Triplett, Tammy, PA-C  ferrous sulfate 325 (65 FE) MG EC tablet Take 325 mg by mouth daily with breakfast.    [provider]  HYDROcodone-acetaminophen (NORCO/VICODIN) 5-325 MG tablet Take one-two tabs po q 4-6 hrs prn pain 01/15/16   Triplett, Tammy, PA-C  ibuprofen (ADVIL,MOTRIN) 200 MG tablet Take 200 mg by mouth every 6 (six) hours as needed.    [provider]  ibuprofen (ADVIL,MOTRIN) 800 MG tablet Take 1 tablet (800 mg total) by mouth every 8 (eight) hours as needed for mild pain. 12/09/15   Ward, Layla MawKristen N, DO  lidocaine (XYLOCAINE) 2 % solution Use as directed 10 mLs in the mouth or throat every 4 (four) hours as needed for mouth pain. 12/09/15   Ward, Layla MawKristen N, DO  metroNIDAZOLE (FLAGYL) 500 MG tablet Take 1 tablet (500 mg total) by mouth 2 (two) times daily. 01/30/16   Azalia Bilisampos, Kevin, MD  Multiple Vitamins-Minerals (WOMENS  MULTI VITAMIN & MINERAL PO) Take 1 tablet by mouth daily.    [provider]  pseudoephedrine (SUDAFED) 30 MG tablet Take 1 tablet (30 mg total) by mouth every 4 (four) hours as needed for congestion. 02/02/17   Benjiman Core, MD    Family History History reviewed. No pertinent family history.  Social History Social History  Substance Use Topics  . Smoking status: Light Tobacco Smoker    Packs/day: 0.25    Types: Cigarettes  . Smokeless tobacco: Never Used  . Alcohol use Yes     Comment: occ     Allergies   Patient has no known allergies.   Review of Systems Review of Systems  Constitutional: Negative for activity  change and appetite change.  HENT: Positive for congestion and sore throat.   Respiratory: Negative for shortness of breath.   Cardiovascular: Negative for chest pain.  Genitourinary: Negative for dysuria.  Musculoskeletal: Positive for myalgias.  Skin: Positive for rash.  Hematological: Negative for adenopathy.     Physical Exam Updated Vital Signs BP (!) 135/93 (BP Location: Left Arm)   Pulse 85   Temp 98.1 F (36.7 C) (Oral)   Resp 18   Ht 5\' 6"  (1.676 m)   Wt 100.7 kg (222 lb)   LMP 01/10/2017   SpO2 98%   BMI 35.83 kg/m   Physical Exam  Constitutional: She appears well-developed.  HENT:  Head: Atraumatic.  Nasal congestion. Slight posterior pharyngeal erythema. Bilateral TMs normal.  Eyes: EOM are normal.  Cardiovascular: Normal rate.   Abdominal: Soft.  Neurological: She is alert.  Skin: Skin is warm.  Few plaques on left upper chest wall. Approximately 1-2 cm. Some darkening of skin. Pruritic. No drainage.  Psychiatric: She has a normal mood and affect.     ED Treatments / Results  Labs (all labs ordered are listed, but only abnormal results are displayed) Labs Reviewed - No data to display  EKG  EKG Interpretation None       Radiology No results found.  Procedures Procedures (including critical care time)  Medications Ordered in ED Medications - No data to display   Initial Impression / Assessment and Plan / ED Course  I have reviewed the triage vital signs and the nursing notes.  Pertinent labs & imaging results that were available during my care of the patient were reviewed by me and considered in my medical decision making (see chart for details).     Patient with likely sinusitis. Will treat with decongestants. Also potential tinea on chest wall. Will treat with Lotrisone  Cream. Will discharge home. Final Clinical Impressions(s) / ED Diagnoses   Final diagnoses:  Tinea corporis  Sinusitis, unspecified chronicity, unspecified  location    New Prescriptions Discharge Medication List as of 02/02/2017  7:41 AM    START taking these medications   Details  clotrimazole-betamethasone (LOTRISONE) cream Apply to affected area 2 times daily prn, Print    pseudoephedrine (SUDAFED) 30 MG tablet Take 1 tablet (30 mg total) by mouth every 4 (four) hours as needed for congestion., Starting Thu 02/02/2017, Print         Benjiman Core, MD 02/02/17 [phone]

## 2017-02-02 NOTE — ED Triage Notes (Addendum)
Pt reports cough,congestion, bilateral ear pressure, back pain and right leg pain x2 weeks. Pt reports intermittent "brown spots" to left chest for last several days. Pt denies any new self care products. nad noted.

## 2017-04-21 ENCOUNTER — Encounter (HOSPITAL_COMMUNITY): Payer: Self-pay | Admitting: Emergency Medicine

## 2017-04-21 ENCOUNTER — Emergency Department (HOSPITAL_COMMUNITY)
Admission: EM | Admit: 2017-04-21 | Discharge: 2017-04-21 | Disposition: A | Discharge status: Home / Self Care | Payer: Self-pay | Attending: Emergency Medicine | Admitting: Emergency Medicine

## 2017-04-21 ENCOUNTER — Emergency Department (HOSPITAL_COMMUNITY): Payer: Self-pay

## 2017-04-21 DIAGNOSIS — F17210 Nicotine dependence, cigarettes, uncomplicated: Secondary | ICD-10-CM | POA: Insufficient documentation

## 2017-04-21 DIAGNOSIS — J45909 Unspecified asthma, uncomplicated: Secondary | ICD-10-CM | POA: Insufficient documentation

## 2017-04-21 DIAGNOSIS — J039 Acute tonsillitis, unspecified: Secondary | ICD-10-CM | POA: Insufficient documentation

## 2017-04-21 LAB — COMPREHENSIVE METABOLIC PANEL WITH GFR
ALT: 11 U/L — ABNORMAL LOW (ref 14–54)
AST: 19 U/L (ref 15–41)
Albumin: 3.7 g/dL (ref 3.5–5.0)
Alkaline Phosphatase: 65 U/L (ref 38–126)
Anion gap: 8 (ref 5–15)
BUN: 17 mg/dL (ref 6–20)
CO2: 21 mmol/L — ABNORMAL LOW (ref 22–32)
Calcium: 8.9 mg/dL (ref 8.9–10.3)
Chloride: 106 mmol/L (ref 101–111)
Creatinine, Ser: 1.15 mg/dL — ABNORMAL HIGH (ref 0.44–1.00)
GFR calc Af Amer: >60 mL/min
GFR calc non Af Amer: >60 mL/min
Glucose, Bld: 74 mg/dL (ref 65–99)
Potassium: 4 mmol/L (ref 3.5–5.1)
Sodium: 135 mmol/L (ref 135–145)
Total Bilirubin: 0.3 mg/dL (ref 0.3–1.2)
Total Protein: 7.9 g/dL (ref 6.5–8.1)

## 2017-04-21 LAB — CBC WITH DIFFERENTIAL/PLATELET
Basophils Absolute: 0 K/uL (ref 0.0–0.1)
Basophils Relative: 0 %
Eosinophils Absolute: 0.4 K/uL (ref 0.0–0.7)
Eosinophils Relative: 4 %
HCT: 32.5 % — ABNORMAL LOW (ref 36.0–46.0)
Hemoglobin: 10.6 g/dL — ABNORMAL LOW (ref 12.0–15.0)
Lymphocytes Relative: 35 %
Lymphs Abs: 3.1 K/uL (ref 0.7–4.0)
MCH: 24.7 pg — ABNORMAL LOW (ref 26.0–34.0)
MCHC: 32.6 g/dL (ref 30.0–36.0)
MCV: 75.6 fL — ABNORMAL LOW (ref 78.0–100.0)
Monocytes Absolute: 1 K/uL (ref 0.1–1.0)
Monocytes Relative: 11 %
Neutro Abs: 4.4 K/uL (ref 1.7–7.7)
Neutrophils Relative %: 50 %
Platelets: 215 K/uL (ref 150–400)
RBC: 4.3 MIL/uL (ref 3.87–5.11)
RDW: 15.6 % — ABNORMAL HIGH (ref 11.5–15.5)
WBC: 8.9 K/uL (ref 4.0–10.5)

## 2017-04-21 MED ORDER — IOPAMIDOL (ISOVUE-300) INJECTION 61%
75.0000 mL | Freq: Once | INTRAVENOUS | Status: AC | PRN
  Administered 2017-04-21: 75 mL via INTRAVENOUS

## 2017-04-21 MED ORDER — ONDANSETRON HCL 4 MG/2ML IJ SOLN
4.0000 mg | Freq: Once | INTRAMUSCULAR | Status: AC
  Administered 2017-04-21: 4 mg via INTRAVENOUS
  Filled 2017-04-21: qty 2

## 2017-04-21 MED ORDER — HYDROMORPHONE HCL 1 MG/ML IJ SOLN
0.5000 mg | Freq: Once | INTRAMUSCULAR | Status: AC
  Administered 2017-04-21: 0.5 mg via INTRAVENOUS
  Filled 2017-04-21: qty 1

## 2017-04-21 MED ORDER — RANITIDINE HCL 150 MG PO TABS: 150.0000 mg | ORAL_TABLET | Freq: Two times a day (BID) | ORAL | 0 refills | Status: DC

## 2017-04-21 MED ORDER — HYDROCODONE-ACETAMINOPHEN 5-325 MG PO TABS: 1.0000 | ORAL_TABLET | Freq: Four times a day (QID) | ORAL | 0 refills | Status: DC | PRN

## 2017-04-21 MED ORDER — ONDANSETRON HCL 4 MG/2ML IJ SOLN
4.0000 mg | Freq: Once | INTRAMUSCULAR | Status: AC
Start: 2017-04-21 — End: 2017-04-21
  Administered 2017-04-21: 4 mg via INTRAVENOUS
  Filled 2017-04-21: qty 2

## 2017-04-21 MED ORDER — AMOXICILLIN-POT CLAVULANATE 875-125 MG PO TABS: 1.0000 | ORAL_TABLET | Freq: Two times a day (BID) | ORAL | 0 refills | Status: DC

## 2017-04-21 NOTE — ED Triage Notes (Signed)
Pt c/o difficulty swallowing x 2 weeks-pt states she feels like something is stuck in her throat. Pt also c/o "pain in her esophagus" and back pain.

## 2017-04-21 NOTE — Discharge Instructions (Signed)
Follow up with dr. Suszanne Connerseoh next week

## 2017-04-21 NOTE — ED Notes (Signed)
ED Provider at bedside. 

## 2017-04-21 NOTE — ED Provider Notes (Signed)
[facility] Provider Note   CSN: [id number] Arrival date & time: 04/21/17  1709     History   Chief Complaint Chief Complaint  Patient presents with  . Dysphagia    HPI Robin Perez is a 31 y.o. female.  Patient states it hurts when she swallows. She feels like something stuck in there. No fever   The history is provided by the patient. A language interpreter was used.  Sore Throat  This is a new problem. The current episode started more than 2 days ago. The problem occurs constantly. The problem has not changed since onset.Pertinent negatives include no chest pain, no abdominal pain and no headaches. Nothing aggravates the symptoms. Nothing relieves the symptoms.    Past Medical History:  Diagnosis Date  . Asthma   . Incisional pain 05/09/2013   Will rx cipro  . Vertigo     Patient Active Problem List   Diagnosis Date Noted  . Lower extremity cellulitis 06/25/2015  . Sepsis (HCC) 06/25/2015  . Hematuria, undiagnosed cause 06/04/2015  . Chronic sinusitis 06/04/2015  . Cellulitis 06/04/2015  . Microcytic anemia 06/04/2015  . Tobacco abuse 06/04/2015  . Cellulitis and abscess of lower extremity 06/04/2015  . Incisional pain 05/09/2013  . Sterilization consult 03/15/2013  . History of cesarean delivery, currently pregnant 11/20/2012  . Supervision of other normal pregnancy 11/20/2012  . Asthma 10/24/2012    Past Surgical History:  Procedure Laterality Date  . CESAREAN SECTION    . CESAREAN SECTION    . CESAREAN SECTION WITH BILATERAL TUBAL LIGATION N/A 04/23/2013   Procedure: CESAREAN SECTION WITH BILATERAL TUBAL LIGATION;  Surgeon: Willodean Rosenthalarolyn Harraway-Smith, MD;  Location: [facility];  Service: Obstetrics;  Laterality: N/A;    OB History    Gravida Para Term Preterm AB Living   3 3 3     3    SAB TAB Ectopic Multiple Live Births           3       Home Medications    Prior to Admission medications   Medication Sig Start Date End Date Taking?  Authorizing Provider  amoxicillin-clavulanate (AUGMENTIN) 875-125 MG tablet Take 1 tablet by mouth 2 (two) times daily. One po bid x 7 days 04/21/17   Bethann BerkshireZammit, Diane Mochizuki, MD  clotrimazole-betamethasone Thurmond Butts(LOTRISONE) cream Apply to affected area 2 times daily prn 02/02/17   Benjiman CorePickering, Nathan, MD  HYDROcodone-acetaminophen (NORCO/VICODIN) 5-325 MG tablet Take 1 tablet by mouth every 6 (six) hours as needed for moderate pain. 04/21/17   Bethann BerkshireZammit, Shevy Yaney, MD  pseudoephedrine (SUDAFED) 30 MG tablet Take 1 tablet (30 mg total) by mouth every 4 (four) hours as needed for congestion. Patient not taking: Reported on 04/21/2017 02/02/17   Benjiman CorePickering, Nathan, MD  ranitidine (ZANTAC) 150 MG tablet Take 1 tablet (150 mg total) by mouth 2 (two) times daily. 04/21/17   Bethann BerkshireZammit, Lovel Suazo, MD    Family History No family history on file.  Social History Social History  Substance Use Topics  . Smoking status: Light Tobacco Smoker    Packs/day: 0.25    Types: Cigarettes  . Smokeless tobacco: Never Used  . Alcohol use Yes     Comment: occ     Allergies   Patient has no known allergies.   Review of Systems Review of Systems  Constitutional: Negative for appetite change and fatigue.  HENT: Negative for congestion, ear discharge and sinus pressure.        Sore throat  Eyes: Negative for discharge.  Respiratory: Negative for cough.   Cardiovascular: Negative for chest pain.  Gastrointestinal: Negative for abdominal pain and diarrhea.  Genitourinary: Negative for frequency and hematuria.  Musculoskeletal: Negative for back pain.  Skin: Negative for rash.  Neurological: Negative for seizures and headaches.  Psychiatric/Behavioral: Negative for hallucinations.     Physical Exam Updated Vital Signs BP 133/68   Pulse 60   Temp 99 F (37.2 C)   Resp 18   Ht 5\' 7"  (1.702 m)   Wt 100.7 kg (222 lb)   LMP 04/08/2017   SpO2 98%   BMI 34.77 kg/m   Physical Exam  Constitutional: She is oriented to person, place,  and time. She appears well-developed.  HENT:  Head: Normocephalic.  Mildly enlarged tonsils bilaterally  Eyes: Conjunctivae and EOM are normal. No scleral icterus.  Neck: Neck supple. No thyromegaly present.  Cardiovascular: Normal rate and regular rhythm.  Exam reveals no gallop and no friction rub.   No murmur heard. Pulmonary/Chest: No stridor. She has no wheezes. She has no rales. She exhibits no tenderness.  Abdominal: She exhibits no distension. There is no tenderness. There is no rebound.  Musculoskeletal: Normal range of motion. She exhibits no edema.  Lymphadenopathy:    She has no cervical adenopathy.  Neurological: She is oriented to person, place, and time. She exhibits normal muscle tone. Coordination normal.  Skin: No rash noted. No erythema.  Psychiatric: She has a normal mood and affect. Her behavior is normal.     ED Treatments / Results  Labs (all labs ordered are listed, but only abnormal results are displayed) Labs Reviewed  CBC WITH DIFFERENTIAL/PLATELET - Abnormal; Notable for the following:       Result Value   Hemoglobin 10.6 (*)    HCT 32.5 (*)    MCV 75.6 (*)    MCH 24.7 (*)    RDW 15.6 (*)    All other components within normal limits  COMPREHENSIVE METABOLIC PANEL - Abnormal; Notable for the following:    CO2 21 (*)    Creatinine, Ser 1.15 (*)    ALT 11 (*)    All other components within normal limits    EKG  EKG Interpretation None       Radiology Ct Soft Tissue Neck W Contrast  Result Date: 04/21/2017 CLINICAL DATA:  Initial evaluation for difficulty swallowing for 2 weeks, feels like something stuck in throat. EXAM: CT NECK WITH CONTRAST TECHNIQUE: Multidetector CT imaging of the neck was performed using the standard protocol following the bolus administration of intravenous contrast. CONTRAST:  75mL ISOVUE-300 IOPAMIDOL (ISOVUE-300) INJECTION 61% COMPARISON:  None available. FINDINGS: Pharynx and larynx: Oral cavity within normal limits  without mass lesion or loculated fluid collection. No acute abnormality about the dentition. Periapical lucency noted at the right mandible. Palatine tonsils prominent but fairly symmetric in size. Adenoidal soft tissues prominent and fill the nasopharynx. No obvious discrete nasopharyngeal mass. Remainder of the oropharynx within normal limits. No retropharyngeal collection or fusion. Epiglottis within normal limits. Vallecula clear. Remainder of the hypopharynx and supraglottic larynx within normal limits. Small air-filled laryngocele noted on the left. True cords symmetric and within normal limits. Subglottic airway clear. Salivary glands: Salivary glands including the parotid and submandibular glands within normal limits. Thyroid: Thyroid within normal limits. Lymph nodes: Mildly prominent shotty lymph nodes present within the neck bilaterally, most of which are subcentimeter in size, with a few level 2 lymph nodes measuring to the upper limits of normal. No pathologically enlarged  lymph nodes or concerning features identified. Vascular: Normal intravascular enhancement seen throughout the neck. Limited intracranial: Empty sella.  Otherwise unremarkable. Visualized orbits: Partially visualized globes and orbital soft tissues within normal limits. Mastoids and visualized paranasal sinuses: Moderate mucosal thickening present within the visualized ethmoidal air cells. Mild mucosal thickening within the inferior right maxillary sinus. Visualized paranasal sinuses otherwise clear. S-shaped bowing of the nasal septum noted. No concha bullosa. No mastoid effusion. Middle ear cavities are clear. Skeleton: No acute osseous abnormality. No worrisome lytic or blastic osseous lesions. Mild degenerative spondylolysis noted at C6-7. Longus coli muscular calcification noted without associated inflammation or fusion. Upper chest: Partially visualized upper chest within normal limits. Visualized lung apices are clear. Other:  None. IMPRESSION: 1. Diffuse enlargement/hypertrophy of the palatine tonsils and adenoidal soft tissues bilaterally. Findings are nonspecific, but could be related to underlying infectious or inflammatory process. Possible viral infection could be considered. No other significant associated inflammatory changes within the neck. No abscess or other drainable collection. Clinical followup to resolution recommended. 2. No other acute abnormality identified within the neck. Electronically Signed   By: Rise Mu M.D.   On: 04/21/2017 19:28    Procedures Procedures (including critical care time)  Medications Ordered in ED Medications  HYDROmorphone (DILAUDID) injection 0.5 mg (not administered)  ondansetron (ZOFRAN) injection 4 mg (not administered)  HYDROmorphone (DILAUDID) injection 0.5 mg (0.5 mg Intravenous Given 04/21/17 1809)  ondansetron (ZOFRAN) injection 4 mg (4 mg Intravenous Given 04/21/17 1806)  iopamidol (ISOVUE-300) 61 % injection 75 mL (75 mLs Intravenous Contrast Given 04/21/17 1858)     Initial Impression / Assessment and Plan / ED Course  I have reviewed the triage vital signs and the nursing notes.  Pertinent labs & imaging results that were available during my care of the patient were reviewed by me and considered in my medical decision making (see chart for details).     CT scan shows enlarged tonsils. I will treat her with some antibiotics Zantac and pain medicine and have her follow-up with ENT  Final Clinical Impressions(s) / ED Diagnoses   Final diagnoses:  Tonsillitis    New Prescriptions New Prescriptions   AMOXICILLIN-CLAVULANATE (AUGMENTIN) 875-125 MG TABLET    Take 1 tablet by mouth 2 (two) times daily. One po bid x 7 days   HYDROCODONE-ACETAMINOPHEN (NORCO/VICODIN) 5-325 MG TABLET    Take 1 tablet by mouth every 6 (six) hours as needed for moderate pain.   RANITIDINE (ZANTAC) 150 MG TABLET    Take 1 tablet (150 mg total) by mouth 2 (two) times  daily.     Bethann Berkshire, MD 04/21/17 2010

## 2017-04-21 NOTE — ED Notes (Signed)
Lab at the bedside 

## 2017-05-01 IMAGING — DX DG CHEST 2V
2 series · 2 of 2 positions shown · non-contrast
Comparison: February 18, 2015.

CLINICAL DATA: Shortness of breath.

EXAM:
CHEST  2 VIEW

[chest pa]
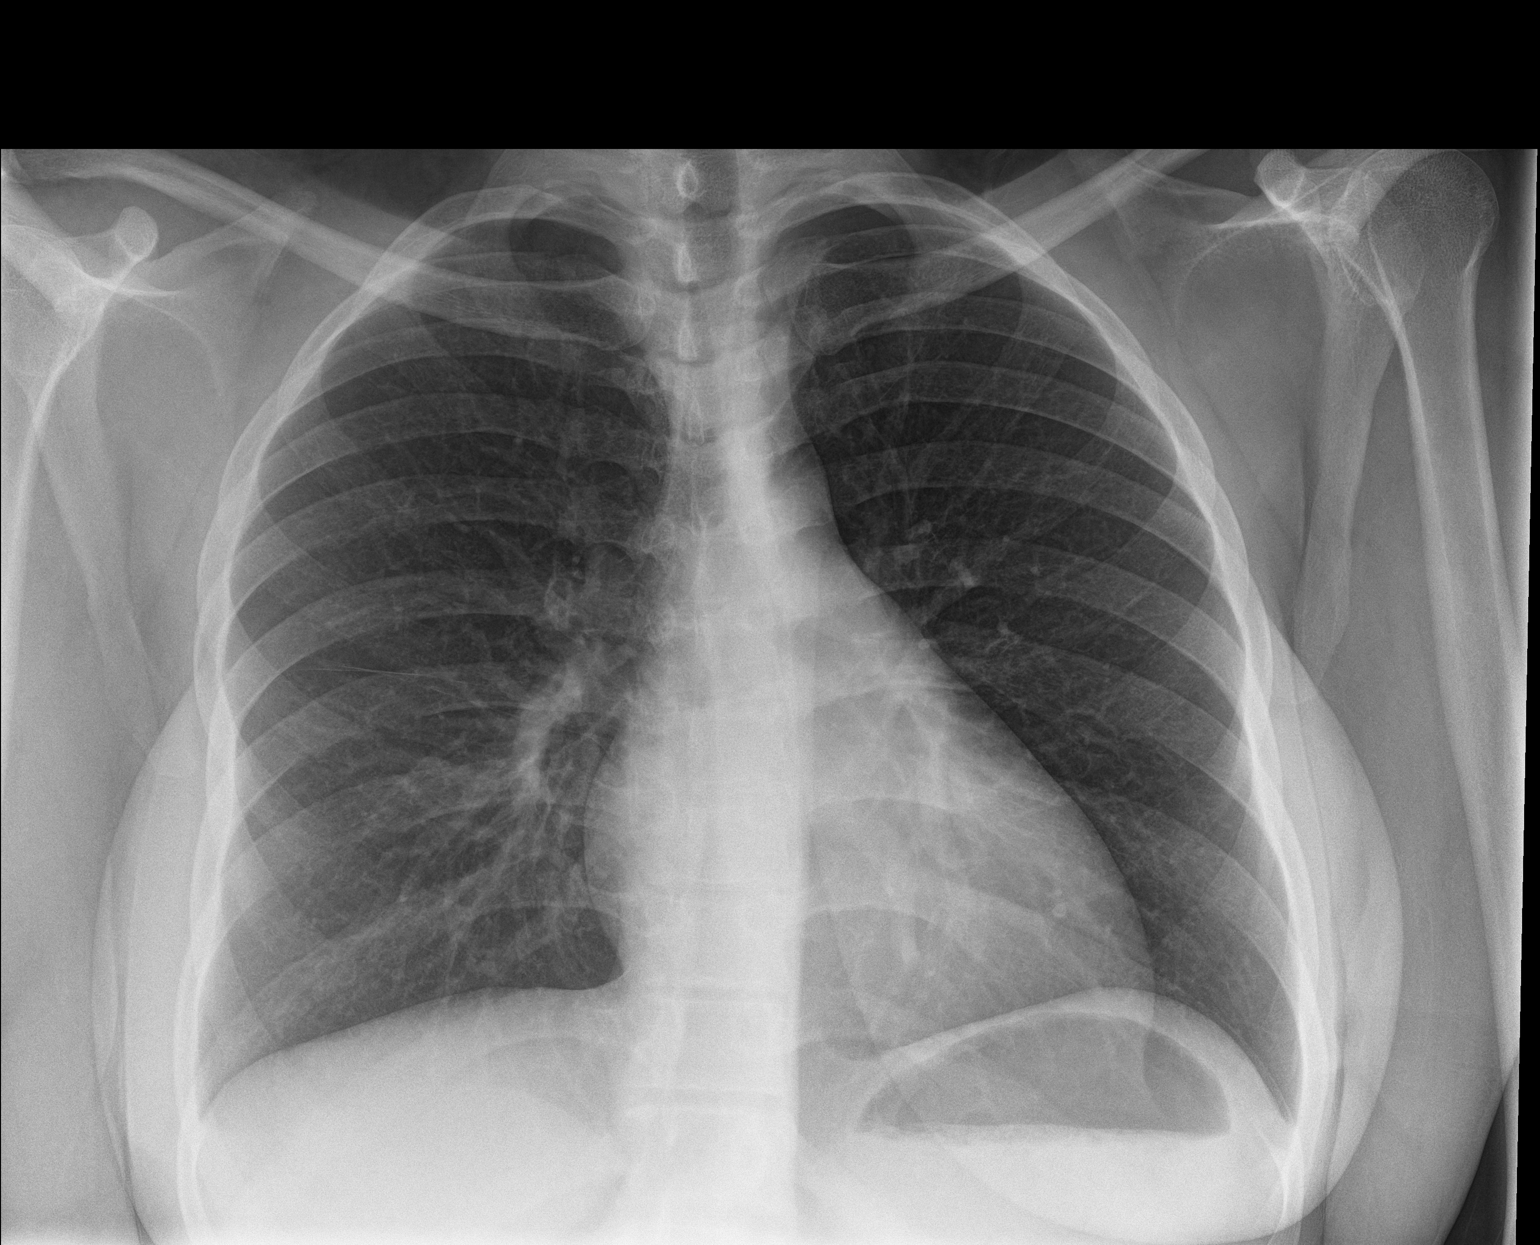

[chest lat]
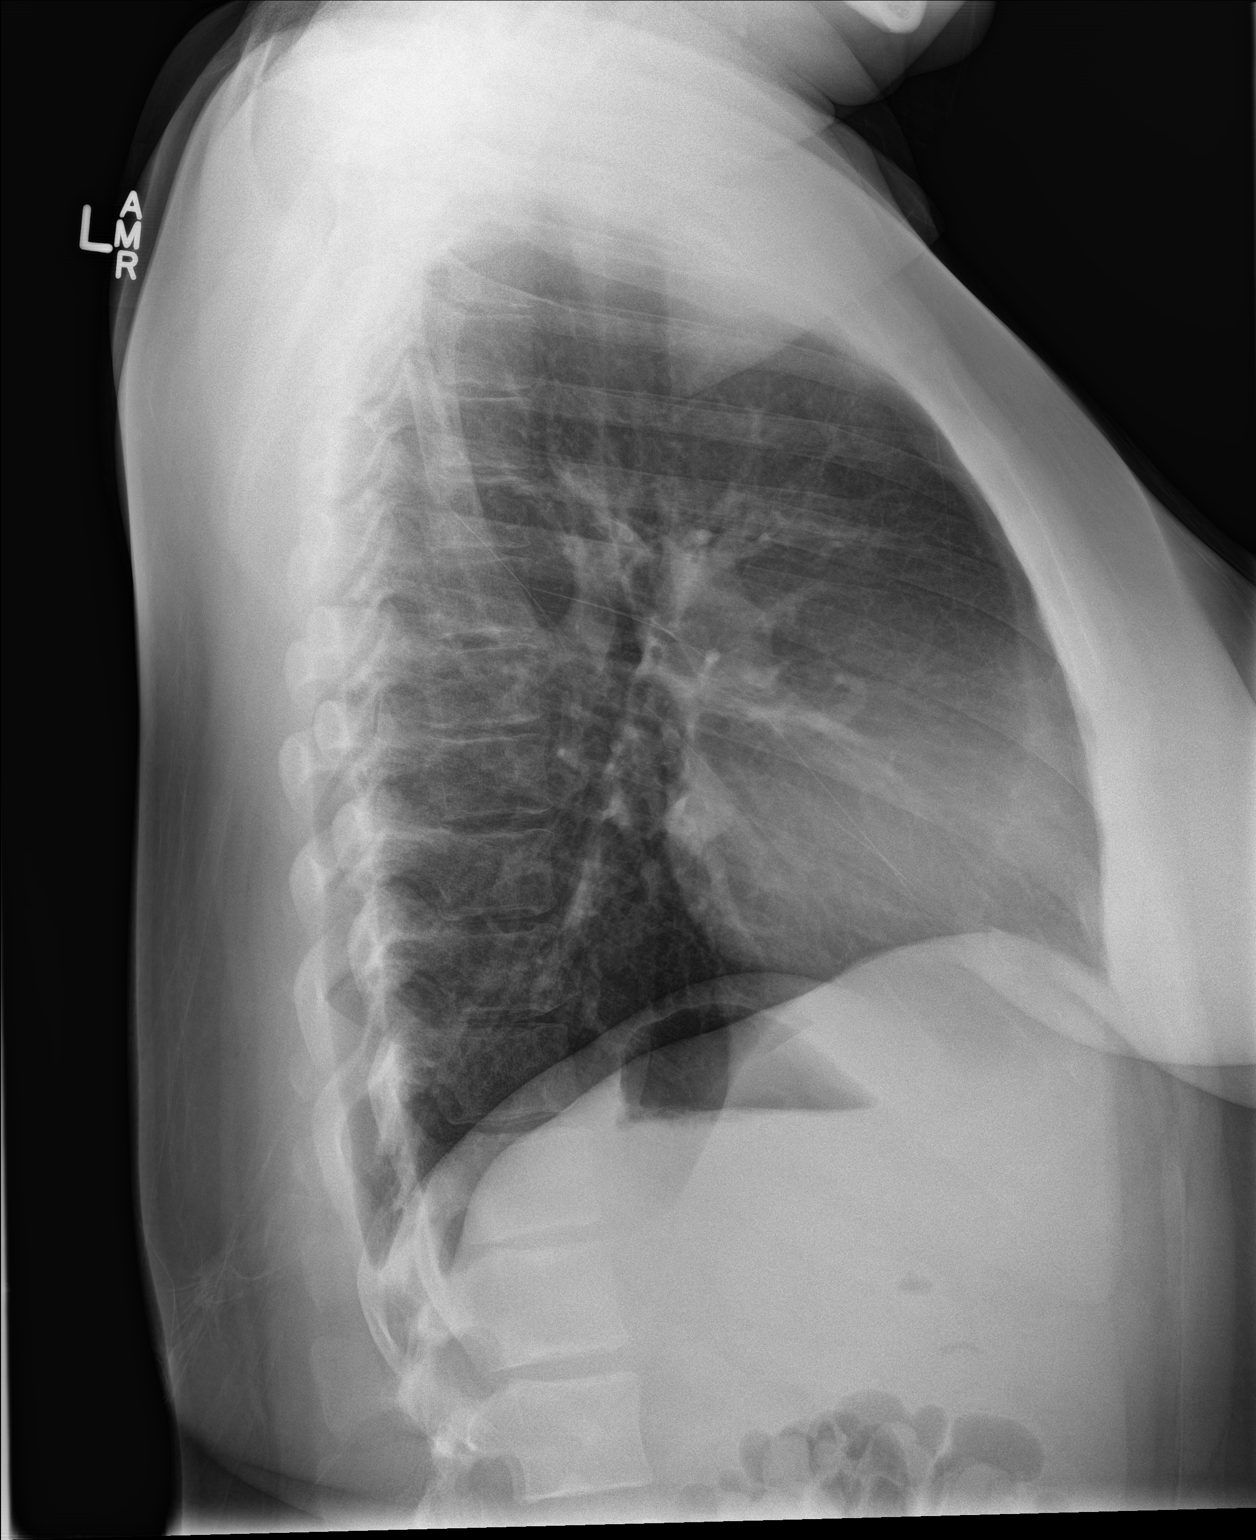

[2 of 2 positions shown; findings below may reference images not displayed]

FINDINGS: The heart size and mediastinal contours are within normal limits.
Both lungs are clear. No pneumothorax or pleural effusion is noted.
The visualized skeletal structures are unremarkable.
IMPRESSION: No active cardiopulmonary disease.

## 2017-06-06 IMAGING — US US RENAL
1 series · 14 of 25 positions shown · non-contrast
Comparison: CT scan of July 15, 2014.

CLINICAL DATA: Gross hematuria.  Bilateral flank pain.

EXAM:
RENAL / URINARY TRACT ULTRASOUND COMPLETE

[Series 1: us renal · 0.21mm/px · 14 of 60 slices shown]
[im 1/60]
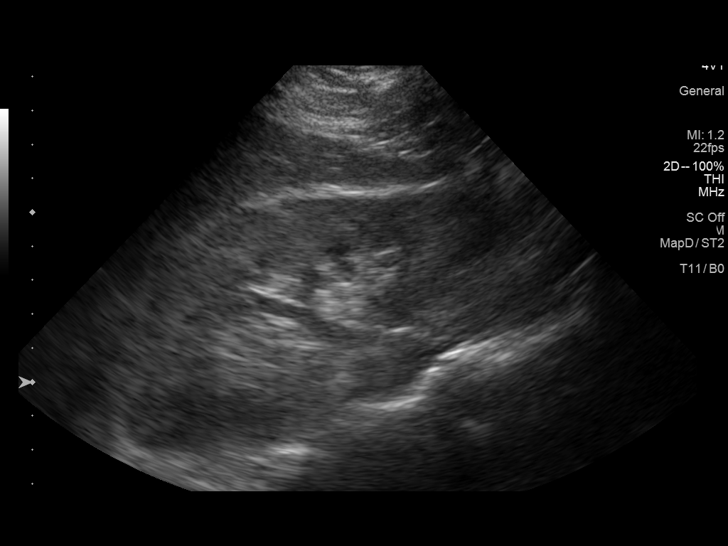
[im 5/60]
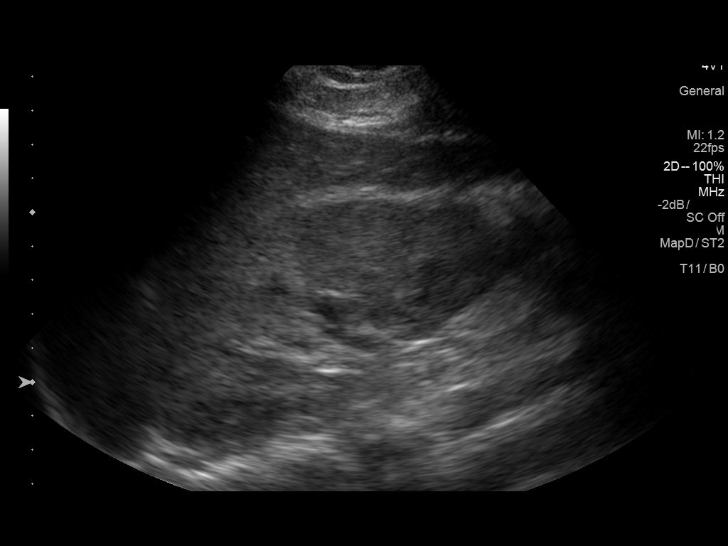
[im 10/60]
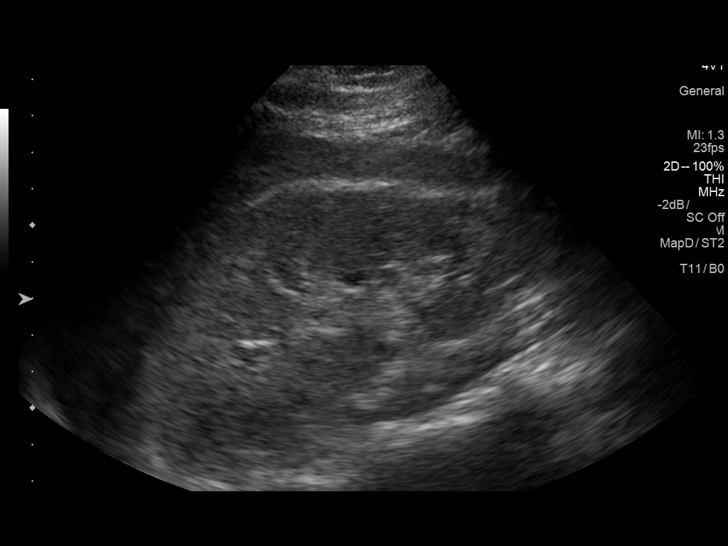
[im 15/60]
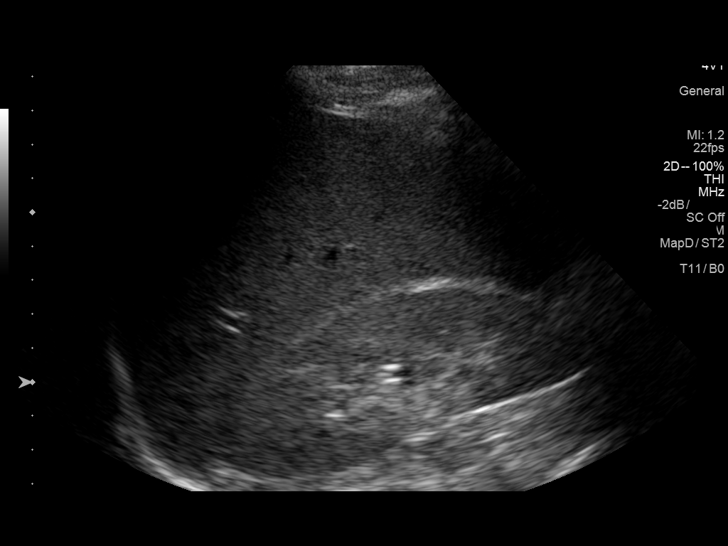
[im 20/60]
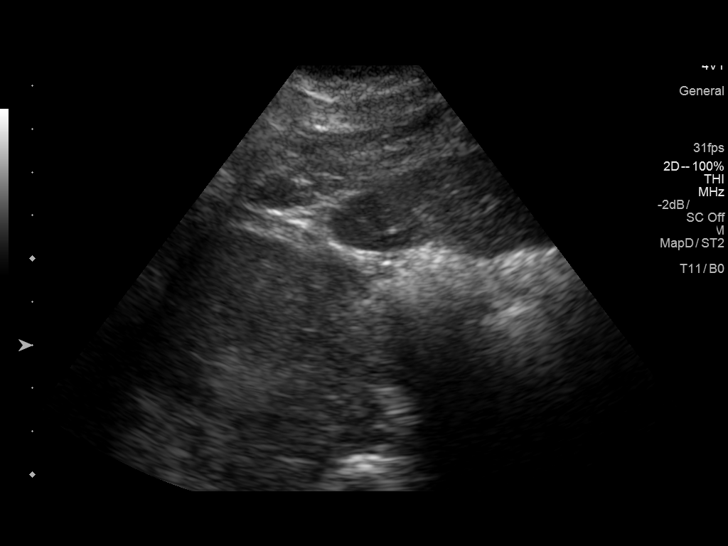
[im 23/60]
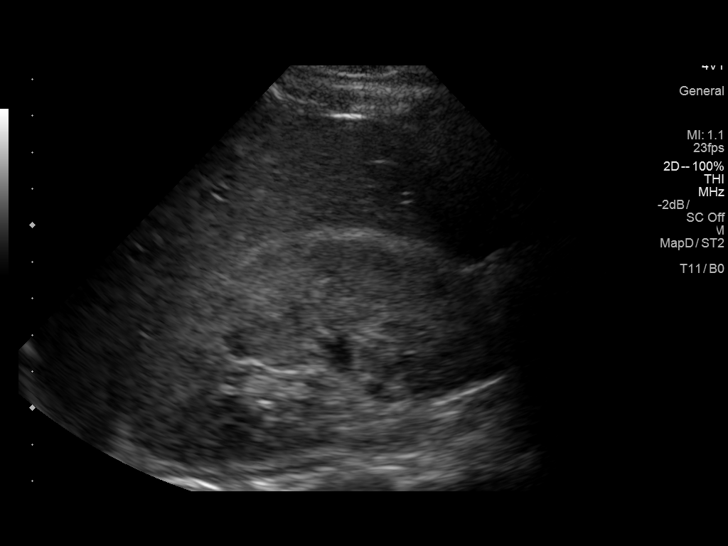
[im 28/60]
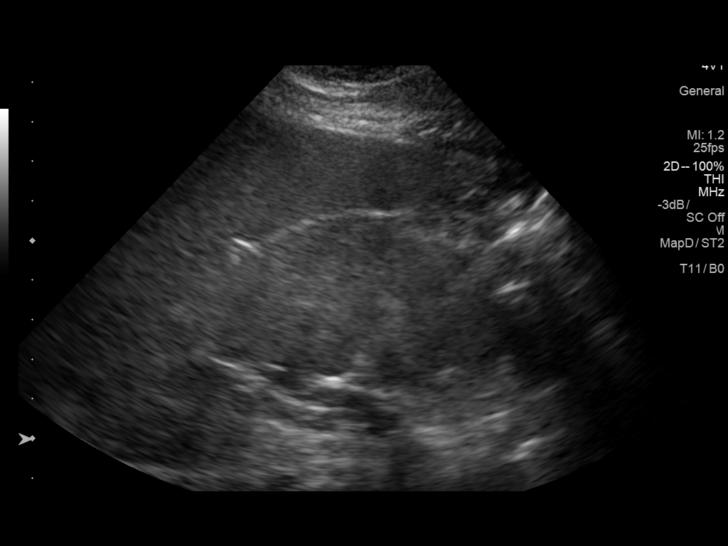
[im 32/60]
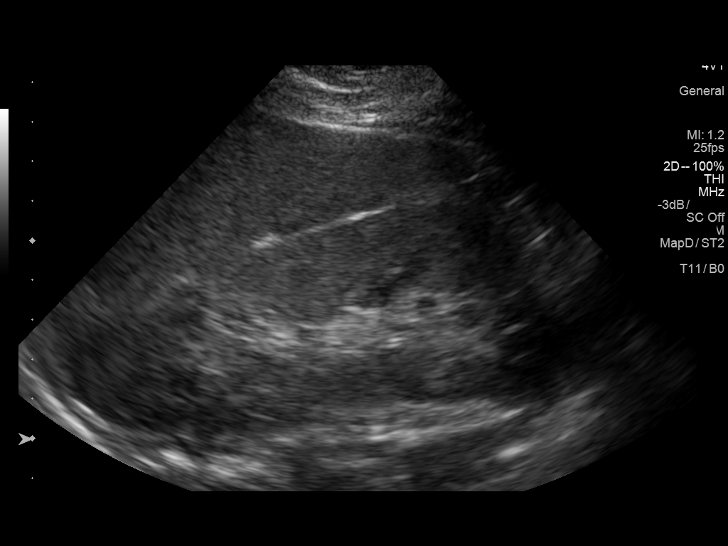
[im 37/60]
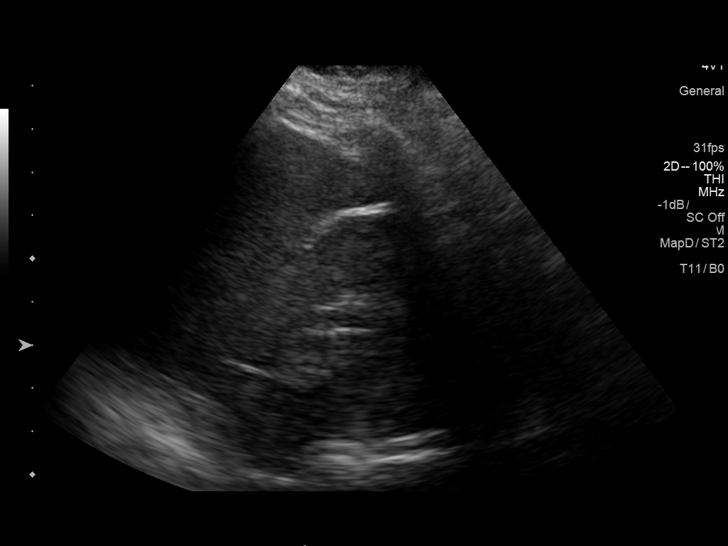
[im 40/60]
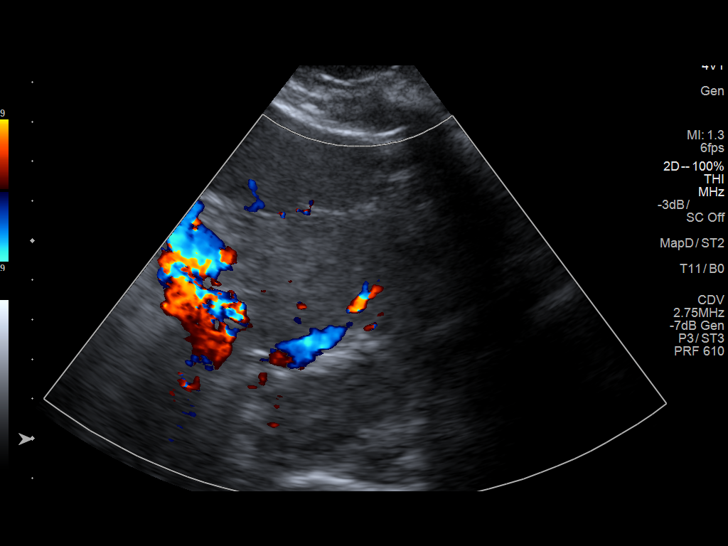
[im 45/60]
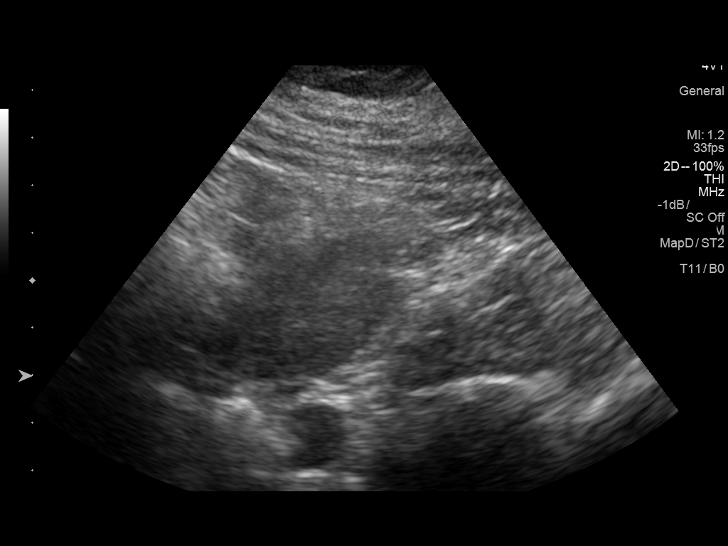
[im 50/60]
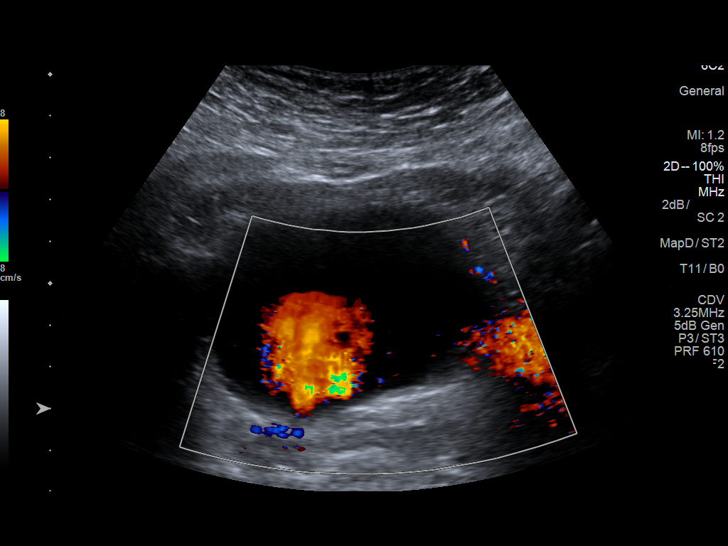
[im 55/60]
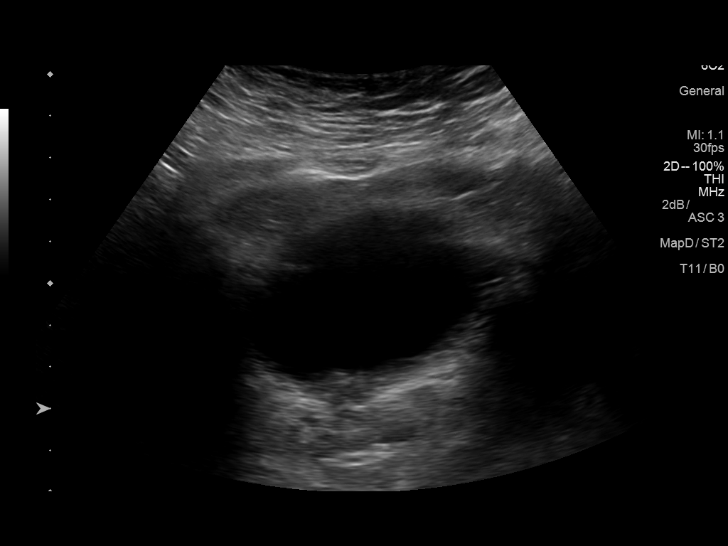
[im 60/60]
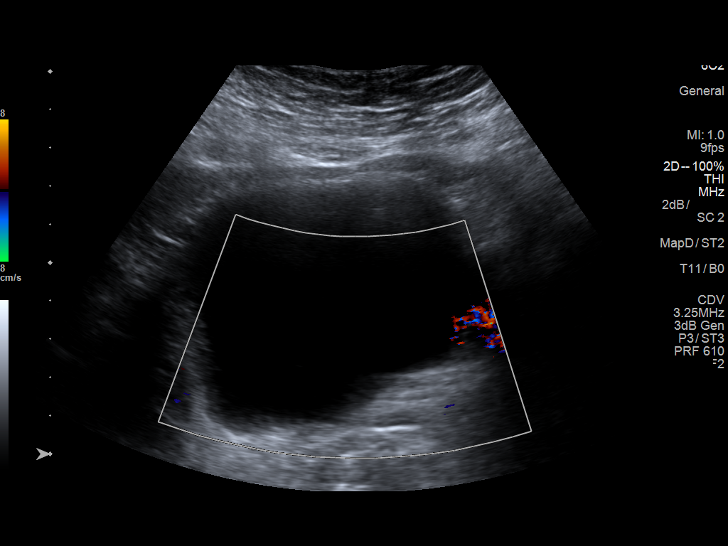

[14 of 25 positions shown; findings below may reference images not displayed]

FINDINGS: Right Kidney:

Length: 13.4 cm. Slightly increased echogenicity of renal parenchyma
is noted. No mass or hydronephrosis visualized.

Left Kidney:

Length: 13.7 cm. Slightly increased echogenicity of renal parenchyma
is noted. No mass or hydronephrosis visualized.

Bladder:

Appears normal for degree of bladder distention. Right ureteral jet
is noted.
IMPRESSION: Slightly increased echogenicity of renal parenchyma is noted
bilaterally suggesting medical renal disease. No hydronephrosis or
renal obstruction is noted.

## 2017-11-17 ENCOUNTER — Encounter (HOSPITAL_COMMUNITY): Payer: Self-pay

## 2017-11-17 ENCOUNTER — Emergency Department (HOSPITAL_COMMUNITY)
Admission: EM | Admit: 2017-11-17 | Discharge: 2017-11-17 | Disposition: A | Discharge status: Home / Self Care | Payer: Self-pay | Attending: Emergency Medicine | Admitting: Emergency Medicine

## 2017-11-17 ENCOUNTER — Emergency Department (HOSPITAL_COMMUNITY): Payer: Self-pay

## 2017-11-17 DIAGNOSIS — Z79899 Other long term (current) drug therapy: Secondary | ICD-10-CM | POA: Insufficient documentation

## 2017-11-17 DIAGNOSIS — J45909 Unspecified asthma, uncomplicated: Secondary | ICD-10-CM | POA: Insufficient documentation

## 2017-11-17 DIAGNOSIS — M79604 Pain in right leg: Secondary | ICD-10-CM | POA: Insufficient documentation

## 2017-11-17 DIAGNOSIS — F17210 Nicotine dependence, cigarettes, uncomplicated: Secondary | ICD-10-CM | POA: Insufficient documentation

## 2017-11-17 DIAGNOSIS — M7989 Other specified soft tissue disorders: Secondary | ICD-10-CM | POA: Insufficient documentation

## 2017-11-17 MED ORDER — HYDROCODONE-ACETAMINOPHEN 5-325 MG PO TABS: 1.0000 | ORAL_TABLET | ORAL | 0 refills | Status: DC | PRN

## 2017-11-17 MED ORDER — HYDROCODONE-ACETAMINOPHEN 5-325 MG PO TABS
1.0000 | ORAL_TABLET | Freq: Once | ORAL | Status: AC
  Administered 2017-11-17: 1 via ORAL
  Filled 2017-11-17: qty 1

## 2017-11-17 NOTE — ED Triage Notes (Signed)
Pt reports right calf pain that started yesterday that has increased in pain today. Noted to be swollen. Pain in sharp and constant today

## 2017-11-17 NOTE — Discharge Instructions (Addendum)
Your ultrasound is negative for a blood clot as the source of your leg pain and swelling.  Elevate your leg as much as you can and apply warm compresses or a heating pad for 15 minutes several times daily.  Wear your compression stocking during the day when walking and standing.  You may take the hydrocodone prescribed for pain relief.  This will make you drowsy - do not drive within 4 hours of taking this medication. Plan to see your doctor for a recheck of your symptoms if not improving with this treatment.  I do not believe your new shoes are the source of your symptoms.

## 2017-11-17 NOTE — ED Provider Notes (Signed)
[facility] Provider Note   CSN: [id number] Arrival date & time: 11/17/17  1612     History   Chief Complaint Chief Complaint  Patient presents with  . Leg Pain    HPI Robin Perez is a 32 y.o. female with past medical history as outlined below including a history problems with right lower leg varicose veins presenting with worsened swelling and pain in her right calf since yesterday. She generally wears compression stockings which helps her swelling and just bought a new pair but has not worn any compression the past few days.  She denies prior history of pain, described as a deep aching throbbing pain.  She denies injury or overuse, no excessive standing.  Additionally denies rash, skin color changes and has no cough, sob or cp.  She is wearing a new pair of high top athletic shoes but they seem comfortable.  She has found no alleviators for her pain.  The history is provided by the patient.    Past Medical History:  Diagnosis Date  . Asthma   . Incisional pain 05/09/2013   Will rx cipro  . Vertigo     Patient Active Problem List   Diagnosis Date Noted  . Lower extremity cellulitis 06/25/2015  . Sepsis (HCC) 06/25/2015  . Hematuria, undiagnosed cause 06/04/2015  . Chronic sinusitis 06/04/2015  . Cellulitis 06/04/2015  . Microcytic anemia 06/04/2015  . Tobacco abuse 06/04/2015  . Cellulitis and abscess of lower extremity 06/04/2015  . Incisional pain 05/09/2013  . Sterilization consult 03/15/2013  . History of cesarean delivery, currently pregnant 11/20/2012  . Supervision of other normal pregnancy 11/20/2012  . Asthma 10/24/2012    Past Surgical History:  Procedure Laterality Date  . CESAREAN SECTION    . CESAREAN SECTION    . CESAREAN SECTION WITH BILATERAL TUBAL LIGATION N/A 04/23/2013   Procedure: CESAREAN SECTION WITH BILATERAL TUBAL LIGATION;  Surgeon: Willodean Rosenthalarolyn Harraway-Smith, MD;  Location: [facility];  Service: Obstetrics;  Laterality:  N/A;     OB History    Gravida  3   Para  3   Term  3   Preterm      AB      Living  3     SAB      TAB      Ectopic      Multiple      Live Births  3            Home Medications    Prior to Admission medications   Medication Sig Start Date End Date Taking? Authorizing Provider  amoxicillin-clavulanate (AUGMENTIN) 875-125 MG tablet Take 1 tablet by mouth 2 (two) times daily. One po bid x 7 days 04/21/17   Bethann BerkshireZammit, Joseph, MD  clotrimazole-betamethasone Thurmond Butts(LOTRISONE) cream Apply to affected area 2 times daily prn 02/02/17   Benjiman CorePickering, Nathan, MD  HYDROcodone-acetaminophen (NORCO/VICODIN) 5-325 MG tablet Take 1 tablet by mouth every 4 (four) hours as needed. 11/17/17   Burgess AmorIdol, Ramey Schiff, PA-C  pseudoephedrine (SUDAFED) 30 MG tablet Take 1 tablet (30 mg total) by mouth every 4 (four) hours as needed for congestion. Patient not taking: Reported on 04/21/2017 02/02/17   Benjiman CorePickering, Nathan, MD  ranitidine (ZANTAC) 150 MG tablet Take 1 tablet (150 mg total) by mouth 2 (two) times daily. 04/21/17   Bethann BerkshireZammit, Joseph, MD  cetirizine (ZYRTEC) 10 MG tablet Take 1 tablet (10 mg total) by mouth daily. 10/06/15 12/09/15  Triplett, Tammy, PA-C  mometasone (NASONEX) 50 MCG/ACT nasal spray  Place 2 sprays into the nose 2 (two) times daily. 10/06/15 12/09/15  Pauline Aus, PA-C    Family History No family history on file.  Social History Social History   Tobacco Use  . Smoking status: Light Tobacco Smoker    Packs/day: 0.25    Types: Cigarettes  . Smokeless tobacco: Never Used  Substance Use Topics  . Alcohol use: Yes    Comment: occ  . Drug use: No    Types: Benzodiazepines    Comment: denies     Allergies   Patient has no known allergies.   Review of Systems Review of Systems  Constitutional: Negative for fever.  HENT: Negative.   Respiratory: Negative.   Cardiovascular: Positive for leg swelling. Negative for chest pain and palpitations.  Musculoskeletal: Positive for  arthralgias. Negative for joint swelling and myalgias.  Skin: Negative for color change, pallor and wound.  Neurological: Negative for weakness and numbness.     Physical Exam Updated Vital Signs BP (!) 154/86 (BP Location: Right Arm)   Pulse 97   Temp 98.7 F (37.1 C) (Oral)   Resp 18   Wt 100.7 kg (222 lb)   LMP 10/27/2017   SpO2 99%   BMI 34.77 kg/m   Physical Exam  Constitutional: She appears well-developed and well-nourished.  HENT:  Head: Atraumatic.  Cardiovascular: Normal rate.  Pulses equal bilaterally  Pulmonary/Chest: Effort normal. No respiratory distress. She has no wheezes.  Musculoskeletal: She exhibits edema and tenderness.       Right lower leg: She exhibits tenderness and swelling. She exhibits no deformity.  Calf soft, but tender with palpable varicosity medial mid to upper calf, no erythema. Dorsalis pedal pulse full.  Edema noted from knee to ankle, nonpitting.  Neurological: She is alert. She has normal strength. She displays normal reflexes. No sensory deficit.  Skin: Skin is warm and dry.  Psychiatric: She has a normal mood and affect.     ED Treatments / Results  Labs (all labs ordered are listed, but only abnormal results are displayed) Labs Reviewed - No data to display  EKG None  Radiology US Venous Img Lower Unilateral Right  Result Date: 11/17/2017 CLINICAL DATA:  RIGHT lower leg pain and swelling EXAM: RIGHT LOWER EXTREMITY VENOUS DOPPLER ULTRASOUND TECHNIQUE: Gray-scale sonography with graded compression, as well as color Doppler and duplex ultrasound were performed to evaluate the lower extremity deep venous systems from the level of the common femoral vein and including the common femoral, femoral, profunda femoral, popliteal and calf veins including the posterior tibial, peroneal and gastrocnemius veins when visible. The superficial great saphenous vein was also interrogated. Spectral Doppler was utilized to evaluate flow at rest and  with distal augmentation maneuvers in the common femoral, femoral and popliteal veins. COMPARISON:  06/25/2015 FINDINGS: Contralateral Common Femoral Vein: Respiratory phasicity is normal and symmetric with the symptomatic side. No evidence of thrombus. Normal compressibility. Common Femoral Vein: No evidence of thrombus. Normal compressibility, respiratory phasicity and response to augmentation. Saphenofemoral Junction: No evidence of thrombus. Normal compressibility and flow on color Doppler imaging. Profunda Femoral Vein: No evidence of thrombus. Normal compressibility and flow on color Doppler imaging. Femoral Vein: No evidence of thrombus. Normal compressibility, respiratory phasicity and response to augmentation. Popliteal Vein: No evidence of thrombus. Normal compressibility, respiratory phasicity and response to augmentation. Calf Veins: No evidence of thrombus. Normal compressibility and flow on color Doppler imaging. Superficial Great Saphenous Vein: No evidence of thrombus. Normal compressibility. Venous Reflux:  None. Other  Findings:  None. IMPRESSION: No evidence of deep venous thrombosis in the RIGHT lower extremity. Electronically Signed   By: Ulyses Southward M.D.   On: 11/17/2017 18:22    Procedures Procedures (including critical care time)  Medications Ordered in ED Medications  HYDROcodone-acetaminophen (NORCO/VICODIN) 5-325 MG per tablet 1 tablet (1 tablet Oral Given 11/17/17 1844)     Initial Impression / Assessment and Plan / ED Course  I have reviewed the triage vital signs and the nursing notes.  Pertinent labs & imaging results that were available during my care of the patient were reviewed by me and considered in my medical decision making (see chart for details).     Pt with increased pain and swelling of the right lower leg with decreased use of her compression stockings which is probably the reason for her increased pain and swelling today. There is no dvt per US imaging.   No exam findings or hx of trauma, plain film imaging not indicated.  Advised elevation, heat, compression stockings. F/u with pcp if sx persist or worsen.  Final Clinical Impressions(s) / ED Diagnoses   Final diagnoses:  Right leg pain    ED Discharge Orders        Ordered    HYDROcodone-acetaminophen (NORCO/VICODIN) 5-325 MG tablet  Every 4 hours PRN     11/17/17 1849       Burgess Amor, Cordelia Poche 11/17/17 1855    LongArlyss Repress, MD 11/18/17 2048

## 2017-11-17 NOTE — ED Notes (Signed)
Spoke with Dr long gave order for US right lower leg

## 2018-04-08 ENCOUNTER — Other Ambulatory Visit: Payer: Self-pay

## 2018-04-08 ENCOUNTER — Emergency Department (HOSPITAL_COMMUNITY)
Admission: EM | Admit: 2018-04-08 | Discharge: 2018-04-08 | Disposition: A | Discharge status: Home / Self Care | Payer: Self-pay | Attending: Emergency Medicine | Admitting: Emergency Medicine

## 2018-04-08 ENCOUNTER — Encounter (HOSPITAL_COMMUNITY): Payer: Self-pay | Admitting: *Deleted

## 2018-04-08 DIAGNOSIS — N939 Abnormal uterine and vaginal bleeding, unspecified: Secondary | ICD-10-CM | POA: Insufficient documentation

## 2018-04-08 DIAGNOSIS — Z5321 Procedure and treatment not carried out due to patient leaving prior to being seen by health care provider: Secondary | ICD-10-CM | POA: Insufficient documentation

## 2018-04-08 DIAGNOSIS — R1010 Upper abdominal pain, unspecified: Secondary | ICD-10-CM | POA: Insufficient documentation

## 2018-04-08 NOTE — ED Triage Notes (Signed)
Pt called to be brought back to room x 1. No answer.

## 2018-04-08 NOTE — ED Triage Notes (Signed)
Pt c/o vaginal bleeding with clots that started yesterday along with abdominal pain. Denies n/v/d.

## 2018-04-08 NOTE — ED Notes (Signed)
Pt called to be brought back to room x 2. No answer.

## 2018-04-09 ENCOUNTER — Other Ambulatory Visit: Payer: Self-pay

## 2018-04-09 ENCOUNTER — Encounter (HOSPITAL_COMMUNITY): Payer: Self-pay | Admitting: Emergency Medicine

## 2018-04-09 ENCOUNTER — Emergency Department (HOSPITAL_COMMUNITY)
Admission: EM | Admit: 2018-04-09 | Discharge: 2018-04-09 | Disposition: A | Discharge status: Home / Self Care | Payer: Self-pay | Attending: Emergency Medicine | Admitting: Emergency Medicine

## 2018-04-09 DIAGNOSIS — R102 Pelvic and perineal pain: Secondary | ICD-10-CM | POA: Insufficient documentation

## 2018-04-09 DIAGNOSIS — N939 Abnormal uterine and vaginal bleeding, unspecified: Secondary | ICD-10-CM | POA: Insufficient documentation

## 2018-04-09 DIAGNOSIS — Z79899 Other long term (current) drug therapy: Secondary | ICD-10-CM | POA: Insufficient documentation

## 2018-04-09 DIAGNOSIS — R42 Dizziness and giddiness: Secondary | ICD-10-CM | POA: Insufficient documentation

## 2018-04-09 DIAGNOSIS — Z87891 Personal history of nicotine dependence: Secondary | ICD-10-CM | POA: Insufficient documentation

## 2018-04-09 DIAGNOSIS — J45909 Unspecified asthma, uncomplicated: Secondary | ICD-10-CM | POA: Insufficient documentation

## 2018-04-09 LAB — URINALYSIS, ROUTINE W REFLEX MICROSCOPIC
BACTERIA UA: NONE SEEN
Bilirubin Urine: NEGATIVE
Glucose, UA: NEGATIVE mg/dL
Ketones, ur: NEGATIVE mg/dL
Leukocytes, UA: NEGATIVE
Nitrite: NEGATIVE
Protein, ur: 30 mg/dL — AB
RBC / HPF: >50 RBC/hpf — ABNORMAL HIGH (ref 0–5)
SPECIFIC GRAVITY, URINE: 1.016 (ref 1.005–1.030)
pH: 7 (ref 5.0–8.0)

## 2018-04-09 LAB — CBC WITH DIFFERENTIAL/PLATELET
Basophils Absolute: 0 10*3/uL (ref 0.0–0.1)
Basophils Relative: 0 %
EOS PCT: 5 %
Eosinophils Absolute: 0.5 10*3/uL (ref 0.0–0.7)
HCT: 29.7 % — ABNORMAL LOW (ref 36.0–46.0)
HEMOGLOBIN: 9.3 g/dL — AB (ref 12.0–15.0)
LYMPHS ABS: 3.8 10*3/uL (ref 0.7–4.0)
Lymphocytes Relative: 36 %
MCH: 23.3 pg — AB (ref 26.0–34.0)
MCHC: 31.3 g/dL (ref 30.0–36.0)
MCV: 74.3 fL — ABNORMAL LOW (ref 78.0–100.0)
MONO ABS: 0.8 10*3/uL (ref 0.1–1.0)
MONOS PCT: 7 %
NEUTROS PCT: 52 %
Neutro Abs: 5.4 10*3/uL (ref 1.7–7.7)
Platelets: ADEQUATE 10*3/uL (ref 150–400)
RBC: 4 MIL/uL (ref 3.87–5.11)
RDW: 17 % — ABNORMAL HIGH (ref 11.5–15.5)
WBC: 10.4 10*3/uL (ref 4.0–10.5)

## 2018-04-09 LAB — BASIC METABOLIC PANEL
Anion gap: 7 (ref 5–15)
BUN: 19 mg/dL (ref 6–20)
CHLORIDE: 106 mmol/L (ref 98–111)
CO2: 24 mmol/L (ref 22–32)
CREATININE: 1.09 mg/dL — AB (ref 0.44–1.00)
Calcium: 8.7 mg/dL — ABNORMAL LOW (ref 8.9–10.3)
GFR calc Af Amer: >60 mL/min (ref >60)
GFR calc non Af Amer: >60 mL/min (ref >60)
Glucose, Bld: 97 mg/dL (ref 70–99)
Potassium: 4 mmol/L (ref 3.5–5.1)
SODIUM: 137 mmol/L (ref 135–145)

## 2018-04-09 LAB — PREGNANCY, URINE: PREG TEST UR: NEGATIVE

## 2018-04-09 MED ORDER — IBUPROFEN 800 MG PO TABS: 800.0000 mg | ORAL_TABLET | Freq: Three times a day (TID) | ORAL | 0 refills | Status: DC | PRN

## 2018-04-09 NOTE — Discharge Instructions (Addendum)
You were evaluated in the emergency department for a longer and heavier period than normal.  This may just be due to imbalanced hormones.  Your pregnancy test was negative here.  You were anemic but it was in the range that you have been in before.  We are prescribing ibuprofen and you should keep well-hydrated.  It will be important for you to follow-up with a primary care doctor.  Please return if any worsening symptoms.

## 2018-04-09 NOTE — ED Provider Notes (Signed)
[facility] Provider Note   CSN: [id number] Arrival date & time: 04/09/18  1916     History   Chief Complaint Chief Complaint  Patient presents with  . Vaginal Bleeding    HPI Robin Perez is a 32 y.o. female.  She had her last normal period about a month ago.  She usually has about 4 days of bleeding and then the fifth day she has some spotting.  She said she started this.  May be a few days early and instead of the.  Tapering off yesterday she was passing clots.  She also thought she passed some pink tissue.  Today the bleeding seems to have settled down.  She is had some crampy pain she is been taking aspirin and ibuprofen with minimal relief.  She felt a little dizzy and a little lightheaded today.  She felt a little short of breath last night.  She is never had irregular periods.  She has had her tubes tied.  She does not have a primary care doctor or get any gynecologic care recently.  The history is provided by the patient.  Vaginal Bleeding  Primary symptoms include pelvic pain (cramps), vaginal bleeding.  Primary symptoms include no genital pain, no genital rash, no genital itching, no genital odor, no dysuria. There has been no fever. This is a new problem. Episode onset: 5 days. The problem occurs constantly. The problem has not changed since onset.She is not pregnant. She has not missed her period. Her LMP was weeks ago. The patient's menstrual history has been regular. Associated symptoms include light-headedness. Pertinent negatives include no abdominal pain and no frequency. She has tried aspirin and NSAIDs for the symptoms. The treatment provided mild relief. There is no concern regarding sexually transmitted diseases.    Past Medical History:  Diagnosis Date  . Asthma   . Incisional pain 05/09/2013   Will rx cipro  . Vertigo     Patient Active Problem List   Diagnosis Date Noted  . Lower extremity cellulitis 06/25/2015  . Sepsis (HCC)  06/25/2015  . Hematuria, undiagnosed cause 06/04/2015  . Chronic sinusitis 06/04/2015  . Cellulitis 06/04/2015  . Microcytic anemia 06/04/2015  . Tobacco abuse 06/04/2015  . Cellulitis and abscess of lower extremity 06/04/2015  . Incisional pain 05/09/2013  . Sterilization consult 03/15/2013  . History of cesarean delivery, currently pregnant 11/20/2012  . Supervision of other normal pregnancy 11/20/2012  . Asthma 10/24/2012    Past Surgical History:  Procedure Laterality Date  . CESAREAN SECTION    . CESAREAN SECTION    . CESAREAN SECTION WITH BILATERAL TUBAL LIGATION N/A 04/23/2013   Procedure: CESAREAN SECTION WITH BILATERAL TUBAL LIGATION;  Surgeon: Willodean Rosenthalarolyn Harraway-Smith, MD;  Location: [facility];  Service: Obstetrics;  Laterality: N/A;     OB History    Gravida  3   Para  3   Term  3   Preterm      AB      Living  3     SAB      TAB      Ectopic      Multiple      Live Births  3            Home Medications    Prior to Admission medications   Medication Sig Start Date End Date Taking? Authorizing Provider  amoxicillin-clavulanate (AUGMENTIN) 875-125 MG tablet Take 1 tablet by mouth 2 (two) times daily. One po bid x 7  days 04/21/17   Bethann Berkshire, MD  clotrimazole-betamethasone Thurmond Butts) cream Apply to affected area 2 times daily prn 02/02/17   Benjiman Core, MD  HYDROcodone-acetaminophen (NORCO/VICODIN) 5-325 MG tablet Take 1 tablet by mouth every 4 (four) hours as needed. 11/17/17   Burgess Amor, PA-C  pseudoephedrine (SUDAFED) 30 MG tablet Take 1 tablet (30 mg total) by mouth every 4 (four) hours as needed for congestion. Patient not taking: Reported on 04/21/2017 02/02/17   Benjiman Core, MD  ranitidine (ZANTAC) 150 MG tablet Take 1 tablet (150 mg total) by mouth 2 (two) times daily. 04/21/17   Bethann Berkshire, MD    Family History History reviewed. No pertinent family history.  Social History Social History   Tobacco Use  . Smoking  status: Former Smoker    Packs/day: 0.25    Types: Cigarettes  . Smokeless tobacco: Never Used  Substance Use Topics  . Alcohol use: Yes    Comment: occ  . Drug use: No    Types: Benzodiazepines    Comment: denies     Allergies   Patient has no known allergies.   Review of Systems Review of Systems  Constitutional: Negative for fever.  HENT: Negative for sore throat.   Eyes: Negative for visual disturbance.  Respiratory: Positive for shortness of breath.   Cardiovascular: Negative for chest pain.  Gastrointestinal: Negative for abdominal pain.  Genitourinary: Positive for pelvic pain (cramps) and vaginal bleeding. Negative for dysuria, frequency and genital sores.  Musculoskeletal: Negative for back pain.  Skin: Negative for rash.  Neurological: Positive for light-headedness and headaches.     Physical Exam Updated Vital Signs BP (!) 152/97 (BP Location: Right Arm)   Pulse 77   Temp 98.2 F (36.8 C) (Oral)   Resp 18   Ht 5\' 7"  (1.702 m)   Wt 100.7 kg   SpO2 100%   BMI 34.77 kg/m   Physical Exam  Constitutional: She appears well-developed and well-nourished. No distress.  HENT:  Head: Normocephalic and atraumatic.  Eyes: Conjunctivae are normal.  Neck: Neck supple.  Cardiovascular: Normal rate, regular rhythm and normal heart sounds.  No murmur heard. Pulmonary/Chest: Effort normal and breath sounds normal. No respiratory distress.  Abdominal: Soft. She exhibits no mass. There is no tenderness. There is no guarding.  Musculoskeletal: Normal range of motion. She exhibits no edema or deformity.  Neurological: She is alert. Gait normal. GCS eye subscore is 4. GCS verbal subscore is 5. GCS motor subscore is 6.  Skin: Skin is warm and dry.  Psychiatric: She has a normal mood and affect.  Nursing note and vitals reviewed.    ED Treatments / Results  Labs (all labs ordered are listed, but only abnormal results are displayed) Labs Reviewed  CBC WITH  DIFFERENTIAL/PLATELET - Abnormal; Notable for the following components:      Result Value   Hemoglobin 9.3 (*)    HCT 29.7 (*)    MCV 74.3 (*)    MCH 23.3 (*)    RDW 17.0 (*)    All other components within normal limits  BASIC METABOLIC PANEL - Abnormal; Notable for the following components:   Creatinine, Ser 1.09 (*)    Calcium 8.7 (*)    All other components within normal limits  URINALYSIS, ROUTINE W REFLEX MICROSCOPIC - Abnormal; Notable for the following components:   APPearance HAZY (*)    Hgb urine dipstick LARGE (*)    Protein, ur 30 (*)    RBC / HPF >50 (*)  All other components within normal limits  PREGNANCY, URINE    EKG None  Radiology No results found.  Procedures Procedures (including critical care time)  Medications Ordered in ED Medications - No data to display   Initial Impression / Assessment and Plan / ED Course  I have reviewed the triage vital signs and the nursing notes.  Pertinent labs & imaging results that were available during my care of the patient were reviewed by me and considered in my medical decision making (see chart for details).  Clinical Course as of Apr 10 1145  Mon Apr 09, 2018  2145 Patient's pregnancy test is negative.  Her urinalysis is consistent with her menses.  I think is ultimately is going to be dysfunctional uterine bleeding and will prescribe her NSAIDs and have her work on getting a primary care doctor.   [MB]  2158 Patient's hemoglobin is 9.3 which is down from her prior but better than some earlier tests.  It still not in a critical range and I think she would benefit from NSAIDs and follow-up with her PCP.   [MB]    Clinical Course User Index [MB] Terrilee FilesButler, Hilary Pundt C, MD     Final Clinical Impressions(s) / ED Diagnoses   Final diagnoses:  Vaginal bleeding    ED Discharge Orders         Ordered    ibuprofen (ADVIL,MOTRIN) 800 MG tablet  Every 8 hours PRN     04/09/18 2142           Terrilee FilesButler, Bronda Alfred  C, MD 04/10/18 1146

## 2018-04-09 NOTE — ED Triage Notes (Signed)
Pt states she has had vaginal bleeding x 5 days, the first 2 days were a normal period but 3 days ago began passing lemon sized bloods clots, pt states she has had a tubal ligation so does not feel she could have been pregnant, reports abd cramping and headache

## 2019-11-19 IMAGING — US US EXTREM LOW VENOUS*R*
1 series · 13 of 24 positions shown · non-contrast
Comparison: 06/25/2015

CLINICAL DATA: RIGHT lower leg pain and swelling



[Series 1: us extrem low venous*right* · 0.07mm/px · 13 of 36 slices shown]
[im 1/36]
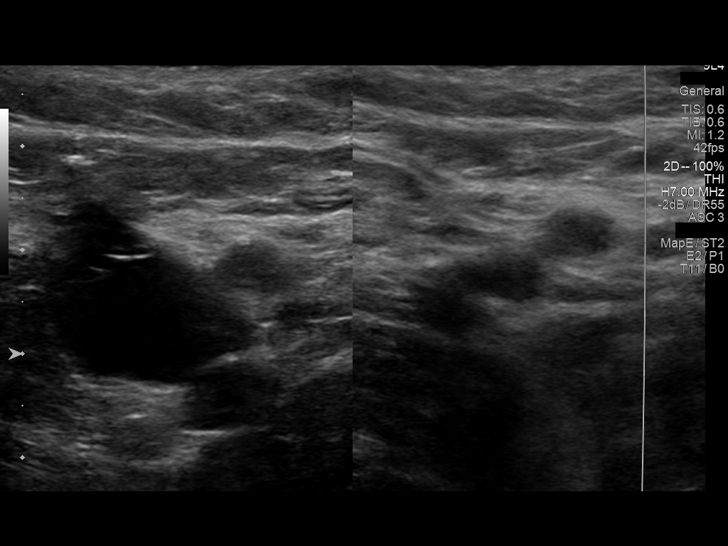
[im 4/36]
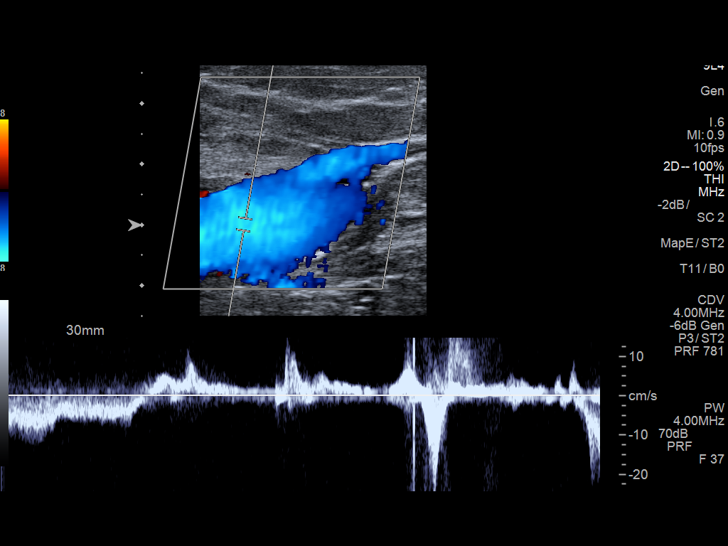
[im 7/36]
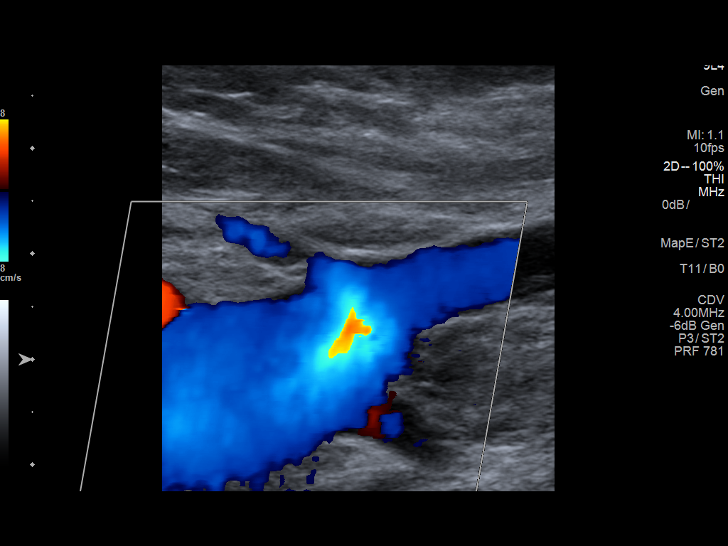
[im 10/36]
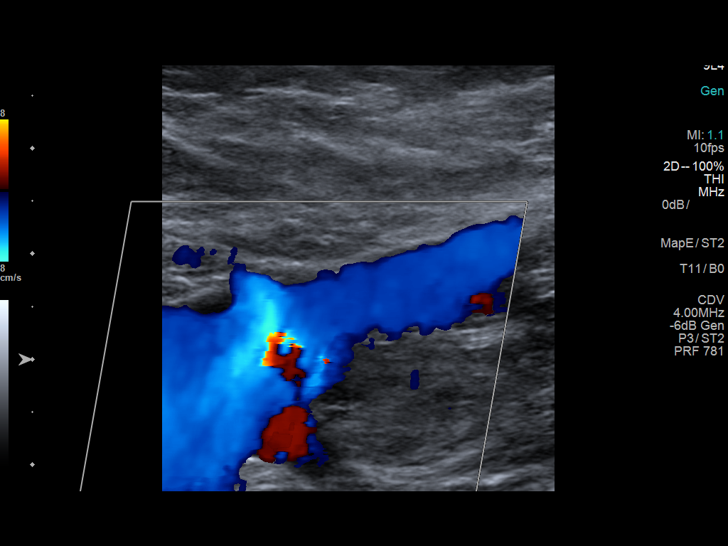
[im 13/36]
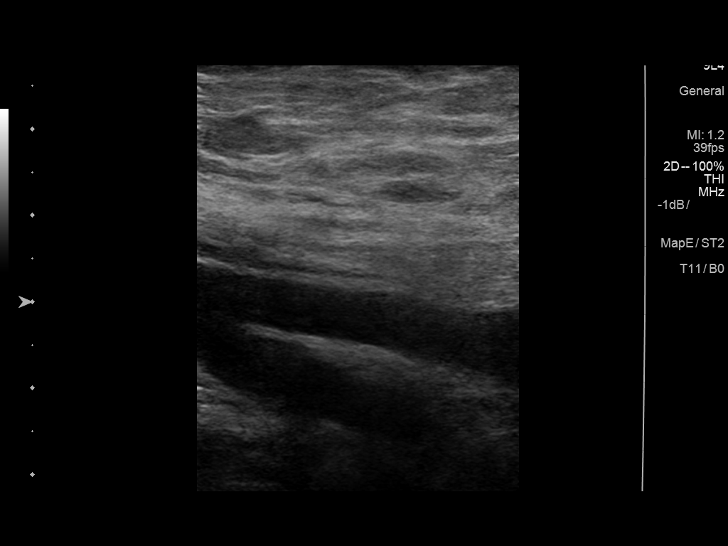
[im 16/36]
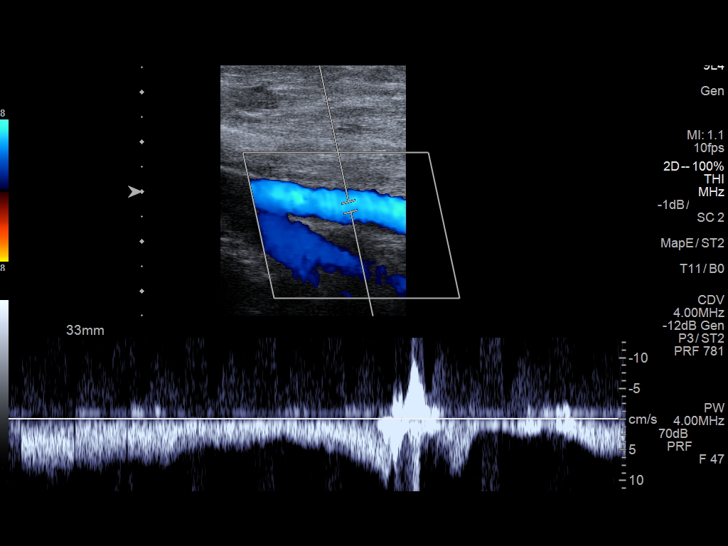
[im 19/36]
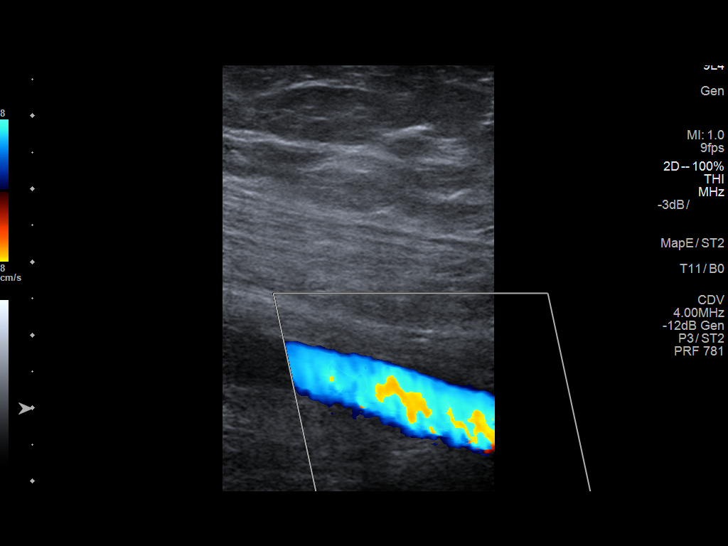
[im 20/36]
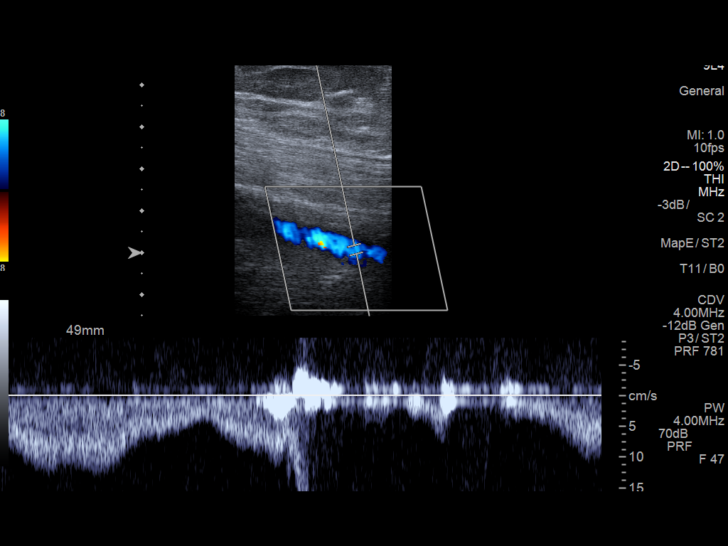
[im 23/36]
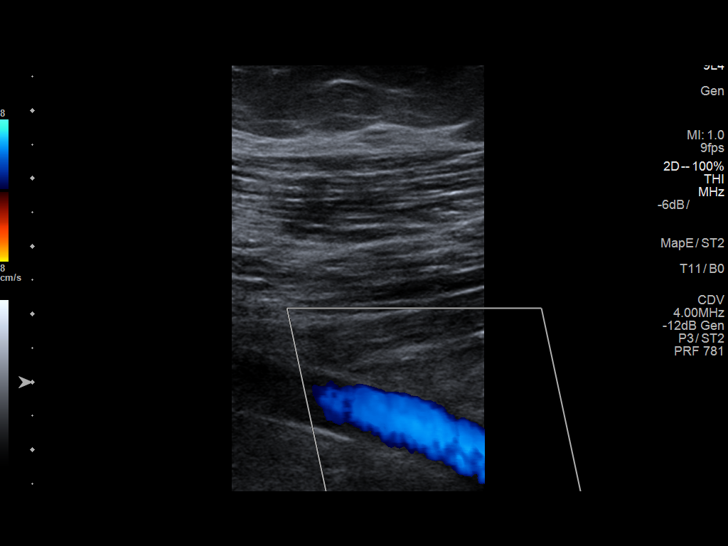
[im 26/36]
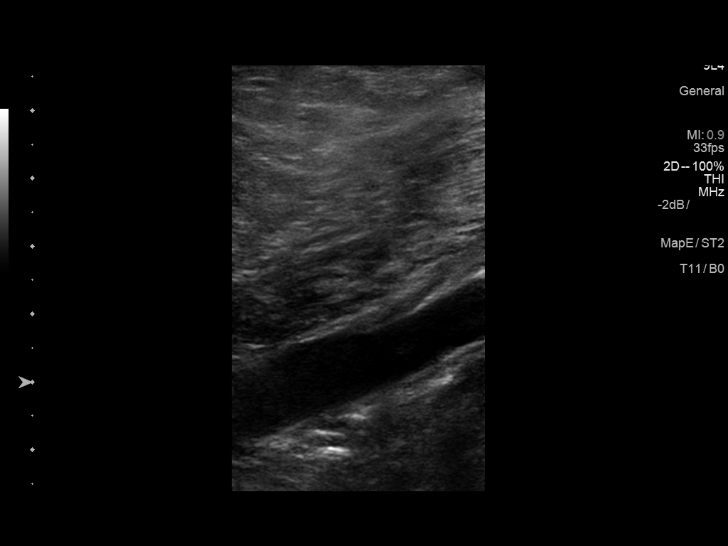
[im 29/36]
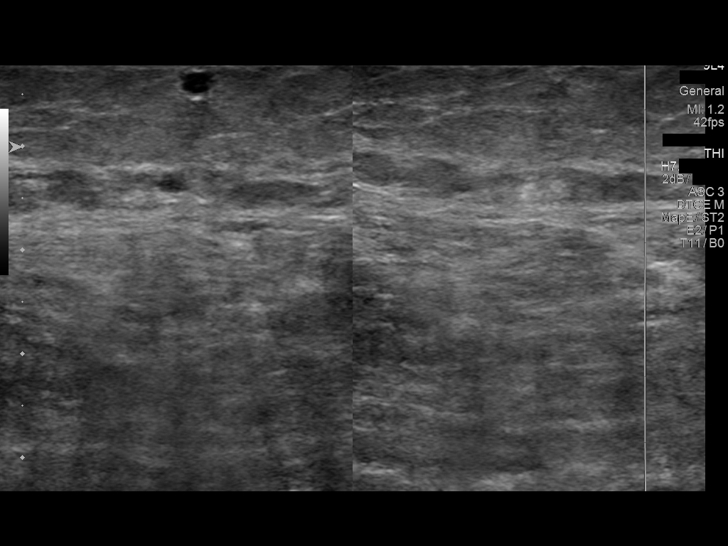
[im 32/36]
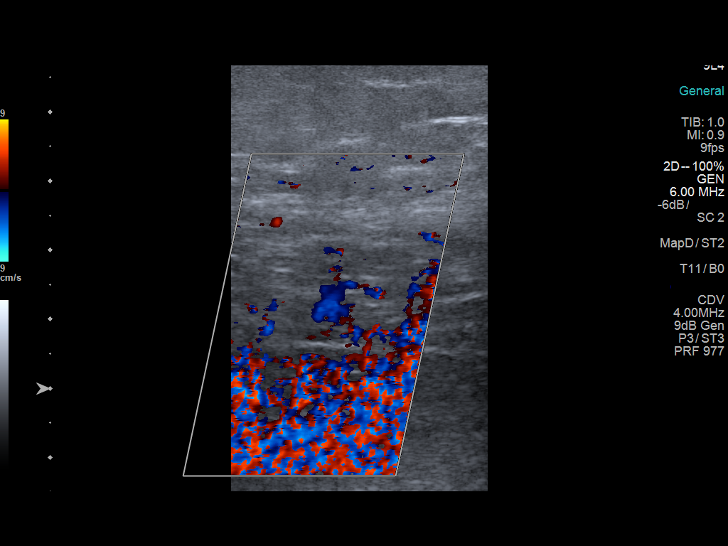
[im 36/36]
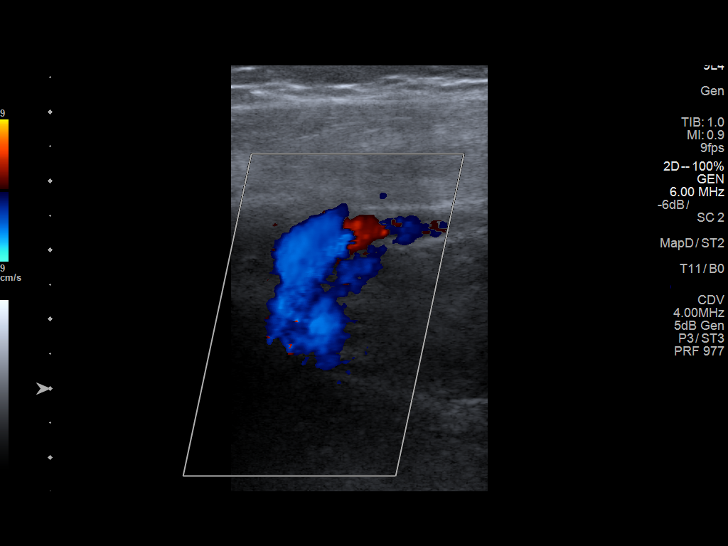

[13 of 24 positions shown; findings below may reference images not displayed]

FINDINGS: Contralateral Common Femoral Vein: Respiratory phasicity is normal
and symmetric with the symptomatic side. No evidence of thrombus.
Normal compressibility.

Common Femoral Vein: No evidence of thrombus. Normal
compressibility, respiratory phasicity and response to augmentation.

Saphenofemoral Junction: No evidence of thrombus. Normal
compressibility and flow on color Doppler imaging.

Profunda Femoral Vein: No evidence of thrombus. Normal
compressibility and flow on color Doppler imaging.

Femoral Vein: No evidence of thrombus. Normal compressibility,
respiratory phasicity and response to augmentation.

Popliteal Vein: No evidence of thrombus. Normal compressibility,
respiratory phasicity and response to augmentation.

Calf Veins: No evidence of thrombus. Normal compressibility and flow
on color Doppler imaging.

Superficial Great Saphenous Vein: No evidence of thrombus. Normal
compressibility.

Venous Reflux:  None.

Other Findings:  None.
IMPRESSION: No evidence of deep venous thrombosis in the RIGHT lower extremity.

## 2022-06-15 ENCOUNTER — Ambulatory Visit: Payer: Medicaid Other | Admitting: Obstetrics and Gynecology

## 2022-12-04 ENCOUNTER — Emergency Department (HOSPITAL_COMMUNITY): Payer: Medicaid Other

## 2022-12-04 ENCOUNTER — Observation Stay (HOSPITAL_COMMUNITY): Payer: Medicaid Other

## 2022-12-04 ENCOUNTER — Encounter (HOSPITAL_COMMUNITY): Payer: Self-pay

## 2022-12-04 ENCOUNTER — Observation Stay (HOSPITAL_COMMUNITY)
Admission: EM | Admit: 2022-12-04 | Discharge: 2022-12-05 | Disposition: A | Discharge status: Home / Self Care | Payer: Medicaid Other | Attending: Emergency Medicine | Admitting: Emergency Medicine

## 2022-12-04 ENCOUNTER — Other Ambulatory Visit: Payer: Self-pay

## 2022-12-04 DIAGNOSIS — Z79899 Other long term (current) drug therapy: Secondary | ICD-10-CM | POA: Diagnosis not present

## 2022-12-04 DIAGNOSIS — G459 Transient cerebral ischemic attack, unspecified: Secondary | ICD-10-CM

## 2022-12-04 DIAGNOSIS — Z7982 Long term (current) use of aspirin: Secondary | ICD-10-CM | POA: Insufficient documentation

## 2022-12-04 DIAGNOSIS — Z87891 Personal history of nicotine dependence: Secondary | ICD-10-CM | POA: Insufficient documentation

## 2022-12-04 DIAGNOSIS — R200 Anesthesia of skin: Principal | ICD-10-CM | POA: Diagnosis present

## 2022-12-04 DIAGNOSIS — Z720 Tobacco use: Secondary | ICD-10-CM | POA: Diagnosis present

## 2022-12-04 DIAGNOSIS — J45909 Unspecified asthma, uncomplicated: Secondary | ICD-10-CM | POA: Insufficient documentation

## 2022-12-04 LAB — DIFFERENTIAL
Abs Immature Granulocytes: 0.03 10*3/uL (ref 0.00–0.07)
Basophils Absolute: 0 10*3/uL (ref 0.0–0.1)
Basophils Relative: 0 %
Eosinophils Absolute: 0.6 10*3/uL — ABNORMAL HIGH (ref 0.0–0.5)
Eosinophils Relative: 5 %
Immature Granulocytes: 0 %
Lymphocytes Relative: 33 %
Lymphs Abs: 4 10*3/uL (ref 0.7–4.0)
Monocytes Absolute: 1 10*3/uL (ref 0.1–1.0)
Monocytes Relative: 8 %
Neutro Abs: 6.7 10*3/uL (ref 1.7–7.7)
Neutrophils Relative %: 54 %

## 2022-12-04 LAB — COMPREHENSIVE METABOLIC PANEL
ALT: 13 U/L (ref 0–44)
AST: 17 U/L (ref 15–41)
Albumin: 3.5 g/dL (ref 3.5–5.0)
Alkaline Phosphatase: 74 U/L (ref 38–126)
Anion gap: 12 (ref 5–15)
BUN: 13 mg/dL (ref 6–20)
CO2: 20 mmol/L — ABNORMAL LOW (ref 22–32)
Calcium: 9.3 mg/dL (ref 8.9–10.3)
Chloride: 103 mmol/L (ref 98–111)
Creatinine, Ser: 1.07 mg/dL — ABNORMAL HIGH (ref 0.44–1.00)
GFR, Estimated: >60 mL/min (ref >60)
Glucose, Bld: 100 mg/dL — ABNORMAL HIGH (ref 70–99)
Potassium: 4 mmol/L (ref 3.5–5.1)
Sodium: 135 mmol/L (ref 135–145)
Total Bilirubin: 0.6 mg/dL (ref 0.3–1.2)
Total Protein: 8.4 g/dL — ABNORMAL HIGH (ref 6.5–8.1)

## 2022-12-04 LAB — CBC
HCT: 35 % — ABNORMAL LOW (ref 36.0–46.0)
Hemoglobin: 11 g/dL — ABNORMAL LOW (ref 12.0–15.0)
MCH: 23.8 pg — ABNORMAL LOW (ref 26.0–34.0)
MCHC: 31.4 g/dL (ref 30.0–36.0)
MCV: 75.8 fL — ABNORMAL LOW (ref 80.0–100.0)
Platelets: 328 10*3/uL (ref 150–400)
RBC: 4.62 MIL/uL (ref 3.87–5.11)
RDW: 16.5 % — ABNORMAL HIGH (ref 11.5–15.5)
WBC: 12.4 10*3/uL — ABNORMAL HIGH (ref 4.0–10.5)
nRBC: 0 % (ref 0.0–0.2)

## 2022-12-04 LAB — I-STAT CHEM 8, ED
BUN: 13 mg/dL (ref 6–20)
Calcium, Ion: 1.12 mmol/L — ABNORMAL LOW (ref 1.15–1.40)
Chloride: 106 mmol/L (ref 98–111)
Creatinine, Ser: 1 mg/dL (ref 0.44–1.00)
Glucose, Bld: 97 mg/dL (ref 70–99)
HCT: 36 % (ref 36.0–46.0)
Hemoglobin: 12.2 g/dL (ref 12.0–15.0)
Potassium: 4.1 mmol/L (ref 3.5–5.1)
Sodium: 137 mmol/L (ref 135–145)
TCO2: 23 mmol/L (ref 22–32)

## 2022-12-04 LAB — APTT: aPTT: 31 seconds (ref 24–36)

## 2022-12-04 LAB — PROTIME-INR
INR: 1 (ref 0.8–1.2)
Prothrombin Time: 13.5 seconds (ref 11.4–15.2)

## 2022-12-04 LAB — ETHANOL: Alcohol, Ethyl (B): <10 mg/dL (ref <10)

## 2022-12-04 LAB — I-STAT BETA HCG BLOOD, ED (MC, WL, AP ONLY): I-stat hCG, quantitative: <5 m[IU]/mL (ref <5)

## 2022-12-04 MED ORDER — SENNOSIDES-DOCUSATE SODIUM 8.6-50 MG PO TABS: 1.0000 | ORAL_TABLET | Freq: Every evening | ORAL | Status: DC | PRN

## 2022-12-04 MED ORDER — ACETAMINOPHEN 650 MG RE SUPP: 650.0000 mg | RECTAL | Status: DC | PRN

## 2022-12-04 MED ORDER — ACETAMINOPHEN 160 MG/5ML PO SOLN: 650.0000 mg | ORAL | Status: DC | PRN

## 2022-12-04 MED ORDER — STROKE: EARLY STAGES OF RECOVERY BOOK
Freq: Once | Status: DC
  Filled 2022-12-04: qty 1

## 2022-12-04 MED ORDER — ENOXAPARIN SODIUM 40 MG/0.4ML IJ SOSY
40.0000 mg | PREFILLED_SYRINGE | INTRAMUSCULAR | Status: DC
  Administered 2022-12-05: 40 mg via SUBCUTANEOUS
  Filled 2022-12-04: qty 0.4

## 2022-12-04 MED ORDER — CLOPIDOGREL BISULFATE 75 MG PO TABS
75.0000 mg | ORAL_TABLET | Freq: Every day | ORAL | Status: DC
  Administered 2022-12-05 (×2): 75 mg via ORAL
  Filled 2022-12-04 (×2): qty 1

## 2022-12-04 MED ORDER — ACETAMINOPHEN 325 MG PO TABS: 650.0000 mg | ORAL_TABLET | ORAL | Status: DC | PRN

## 2022-12-04 MED ORDER — ONDANSETRON HCL 4 MG/2ML IJ SOLN: 4.0000 mg | Freq: Four times a day (QID) | INTRAMUSCULAR | Status: DC | PRN

## 2022-12-04 MED ORDER — ASPIRIN 81 MG PO TBEC
81.0000 mg | DELAYED_RELEASE_TABLET | Freq: Every day | ORAL | Status: DC
  Administered 2022-12-05 (×2): 81 mg via ORAL
  Filled 2022-12-04 (×2): qty 1

## 2022-12-04 MED ORDER — SODIUM CHLORIDE 0.9% FLUSH: 3.0000 mL | Freq: Once | INTRAVENOUS | Status: DC

## 2022-12-04 NOTE — ED Triage Notes (Signed)
Pt started an hour ago with left sided numbness and slurred speech

## 2022-12-04 NOTE — Assessment & Plan Note (Signed)
She reports smoking 1/3 PPD.  Smoking cessation was advised.  She declines nicotine patch.

## 2022-12-04 NOTE — Consult Note (Signed)
NEUROLOGY CONSULTATION NOTE   Date of service: December 04, 2022 Patient Name: Robin Perez MRN:  [id number] DOB:  [birth year] Reason for consult: "L sided numbness" Requesting Provider: Rozelle Logan, DO _ _ _   _ __   _ __ _ _  __ __   _ __   __ _  History of Present Illness  NATANE Perez is a 37 y.o. female with PMH significant for asthma, vertigo, who presents with sudden onset L sided numbness.  Reports she was watching TV and got up to use the bathroom. Did not feel right. Touched her left arm and could barely feel it. Speech was slurring per husband. Husband brought her directly to the ED where a code stroke was activated.  No prior hx of similar symptoms. No hx of substance use, EtOh use.  LKW: 1930 on 12/04/22. mRS: 0 tNKASE: not offered, too mild to treat Thrombectomy: not offered, too mild to treat. NIHSS components Score: Comment  1a Level of Conscious 0[x]  1[]  2[]  3[]      1b LOC Questions 0[x]  1[]  2[]       1c LOC Commands 0[x]  1[]  2[]       2 Best Gaze 0[x]  1[]  2[]       3 Visual 0[x]  1[]  2[]  3[]      4 Facial Palsy 0[x]  1[]  2[]  3[]      5a Motor Arm - left 0[x]  1[]  2[]  3[]  4[]  UN[]    5b Motor Arm - Right 0[x]  1[]  2[]  3[]  4[]  UN[]    6a Motor Leg - Left 0[x]  1[]  2[]  3[]  4[]  UN[]    6b Motor Leg - Right 0[x]  1[]  2[]  3[]  4[]  UN[]    7 Limb Ataxia 0[x]  1[]  2[]  3[]  UN[]     8 Sensory 0[x]  1[]  2[]  UN[]      9 Best Language 0[x]  1[]  2[]  3[]      10 Dysarthria 0[x]  1[]  2[]  UN[]      11 Extinct. and Inattention 0[x]  1[]  2[]       TOTAL: 0       ROS   Constitutional Denies weight loss, fever and chills.   HEENT Denies changes in vision and hearing.   Respiratory Denies SOB and cough.   CV Denies palpitations and CP   GI Denies abdominal pain, nausea, vomiting and diarrhea.   GU Denies dysuria and urinary frequency.   MSK Denies myalgia and joint pain.   Skin Denies rash and pruritus.   Neurological Denies headache and syncope.   Psychiatric Denies recent changes in  mood. Denies anxiety and depression.    Past History   Past Medical History:  Diagnosis Date   Asthma    Incisional pain 05/09/2013   Will rx cipro   Vertigo    Past Surgical History:  Procedure Laterality Date   CESAREAN SECTION     CESAREAN SECTION     CESAREAN SECTION WITH BILATERAL TUBAL LIGATION N/A 04/23/2013   Procedure: CESAREAN SECTION WITH BILATERAL TUBAL LIGATION;  Surgeon: Willodean Rosenthal, MD;  Location: [facility];  Service: Obstetrics;  Laterality: N/A;   History reviewed. No pertinent family history. Social History   Socioeconomic History   Marital status: Single    Spouse name: Not on file   Number of children: Not on file   Years of education: Not on file   Highest education level: Not on file  Occupational History   Not on file  Tobacco Use   Smoking status: Former    Packs/day: .25    Types: Cigarettes  Smokeless tobacco: Never  Substance and Sexual Activity   Alcohol use: Yes    Comment: occ   Drug use: No    Types: Benzodiazepines    Comment: denies   Sexual activity: Yes    Birth control/protection: None    Comment: Had tubes tied  Other Topics Concern   Not on file  Social History Narrative   Not on file   Social Determinants of Health   Financial Resource Strain: Not on file  Food Insecurity: Not on file  Transportation Needs: Not on file  Physical Activity: Not on file  Stress: Not on file  Social Connections: Not on file   No Known Allergies  Medications  (Not in a hospital admission)    Vitals   Vitals:   12/04/22 2038 12/04/22 2045 12/04/22 2100 12/04/22 2145  BP: (!) 141/79 138/88 128/75 125/73  Pulse: 84 90 77 88  Resp: 20 15 19 17   Temp: 98.3 F (36.8 C)     TempSrc: Oral     SpO2: 96% 95% 96% 97%  Weight:      Height:         Body mass index is 36.34 kg/m.  Physical Exam   General: Laying comfortably in bed; in no acute distress.  HENT: Normal oropharynx and mucosa. Normal external appearance of ears  and nose.  Neck: Supple, no pain or tenderness  CV: No JVD. No peripheral edema.  Pulmonary: Symmetric Chest rise. Normal respiratory effort.  Abdomen: Soft to touch, non-tender.  Ext: No cyanosis, edema, or deformity  Skin: No rash. Normal palpation of skin.   Musculoskeletal: Normal digits and nails by inspection. No clubbing.   Neurologic Examination  Mental status/Cognition: Alert, oriented to self, place, month and year, good attention.  Speech/language: Fluent, comprehension intact, object naming intact, repetition intact.  Cranial nerves:   CN II Pupils equal and reactive to light, no VF deficits    CN III,IV,VI EOM intact, no gaze preference or deviation, no nystagmus    CN V normal sensation in V1, V2, and V3 segments bilaterally    CN VII no asymmetry, no nasolabial fold flattening    CN VIII normal hearing to speech    CN IX & X normal palatal elevation, no uvular deviation    CN XI 5/5 head turn and 5/5 shoulder shrug bilaterally    CN XII midline tongue protrusion    Motor:  Muscle bulk: normal, tone normal, pronator drift none tremor none Mvmt Root Nerve  Muscle Right Left Comments  SA C5/6 Ax Deltoid 5 5   EF C5/6 Mc Biceps 5 5   EE C6/7/8 Rad Triceps 5 5   WF C6/7 Med FCR     WE C7/8 PIN ECU     F Ab C8/T1 U ADM/FDI 5 5   HF L1/2/3 Fem Illopsoas 5 5   KE L2/3/4 Fem Quad 5 5   DF L4/5 D Peron Tib Ant 5 5   PF S1/2 Tibial Grc/Sol 5 5    Sensation:  Light touch Decreased to touch in L face, L arm and L leg with midline splitting.   Pin prick    Temperature    Vibration   Proprioception    Coordination/Complex Motor:  - Finger to Nose intact BL - Heel to shin intact BL - Rapid alternating movement are normal - Gait: deferred.  Labs   CBC:  Recent Labs  Lab 12/04/22 2014 12/04/22 2015  WBC  --  12.4*  NEUTROABS  --  6.7  HGB 12.2 11.0*  HCT 36.0 35.0*  MCV  --  75.8*  PLT  --  328    Basic Metabolic Panel:  Lab Results  Component Value Date    NA 135 12/04/2022   K 4.0 12/04/2022   CO2 20 (L) 12/04/2022   GLUCOSE 100 (H) 12/04/2022   BUN 13 12/04/2022   CREATININE 1.07 (H) 12/04/2022   CALCIUM 9.3 12/04/2022   GFRNONAA >60 12/04/2022   GFRAA >60 04/09/2018   Lipid Panel: No results found for: "LDLCALC" HgbA1c: No results found for: "HGBA1C" Urine Drug Screen: No results found for: "LABOPIA", "COCAINSCRNUR", "LABBENZ", "AMPHETMU", "THCU", "LABBARB"  Alcohol Level     Component Value Date/Time   ETH <10 12/04/2022 2015    CT Head without contrast(Personally reviewed): CTH was negative for a large hypodensity concerning for a large territory infarct or hyperdensity concerning for an ICH  MRI Brain(Personally reviewed): No acute stroke or other significant abnormality.  Impression   LATANGELA MCCOMAS is a 37 y.o. female with PMH significant for asthma, vertigo, who presents with sudden onset L sided numbness and slurred speech. Symptoms resolved shortly after arrival to the ED. Her initial neurologic examination is notable for L face, L arm and L leg numbness.  Episode is concerning for a TIA  Recommendations  - Frequent Neuro checks per stroke unit protocol - Recommend brain imaging with MRI Brain without contrast - Recommend Vascular imaging with MRA Angio Head without contrast and US Carotid doppler - Recommend obtaining TTE - Recommend obtaining Lipid panel with LDL - Please start statin if LDL > 70 - Recommend HbA1c to evaluate for diabetes and how well it is controlled. - Antithrombotic - Aspirin  daily along with plavix  daily x 21 days, followed by Aspirin  daily alone. - Recommend DVT ppx - SBP goal - permissive hypertension first 24 h < 220/110. Held home meds.  - Recommend Telemetry monitoring for arrythmia - Recommend bedside swallow screen prior to PO intake. - Stroke education booklet - Recommend PT/OT/SLP consult  - counseled her on the importance of quitting smoking to reduce risk of  stroke in the future. ______________________________________________________________________   Thank you for the opportunity to take part in the care of this patient. If you have any further questions, please contact the neurology consultation attending.  Signed,  Erick Blinks [facility] Pager Number [phone] _ _ _   _ __   _ __ _ _  __ __   _ __   __ _

## 2022-12-04 NOTE — ED Notes (Signed)
Neuro symptoms resolved at this time. Pt reports equal sensation and strength in both extremities.

## 2022-12-04 NOTE — ED Provider Notes (Signed)
[place] [facility] AT [facility] Provider Note   CSN: [id number] Arrival date & time: 12/04/22  1950  An emergency department physician performed an initial assessment on this suspected stroke patient at 2008.  History  Chief Complaint  Patient presents with   Code Stroke    Robin Perez is a 37 y.o. female.  HPI   37 year old female presents emergency department with left-sided numbness.  Patient seen as a code stroke with the neurology team.  Patient states that she was sitting down watching TV with her husband.  When she stood up to use the restroom she had sudden onset of numbness involving the left side of her head, face and upper extremity.  She also felt like the upper extremity was weak.  The husband felt like she was speaking like she was drunk so EMS was called.  Denies any recent drug or alcohol use.  Currently patient is complaining of numbness in the left side of the head/face and upper extremity.  Denies any vision change or dizziness.  Home Medications Prior to Admission medications   Medication Sig Start Date End Date Taking? Authorizing Provider  aspirin EC 81 MG tablet Take 81 mg by mouth daily.    [provider]  ibuprofen (ADVIL,MOTRIN) 800 MG tablet Take 1 tablet (800 mg total) by mouth every 8 (eight) hours as needed for cramping. 04/09/18   Terrilee Files, MD      Allergies    Patient has no known allergies.    Review of Systems   Review of Systems  Constitutional:  Negative for fever.  Eyes:  Negative for visual disturbance.  Cardiovascular:  Negative for chest pain.  Gastrointestinal:  Negative for abdominal pain.  Musculoskeletal:  Negative for back pain.  Neurological:  Positive for speech difficulty, weakness and numbness. Negative for dizziness, seizures, syncope, facial asymmetry and headaches.    Physical Exam Updated Vital Signs BP (!) 141/79 (BP Location: Right Arm)   Pulse 84   Temp 98.3 F (36.8  C) (Oral)   Resp 20   Ht  (1.702 m)   Wt 105.2 kg   LMP  (LMP Unknown)   SpO2 96%   BMI 36.34 kg/m  Physical Exam Vitals and nursing note reviewed.  Constitutional:      Comments: At times tearful  HENT:     Head: Normocephalic.     Mouth/Throat:     Mouth: Mucous membranes are moist.  Eyes:     Extraocular Movements: Extraocular movements intact.     Pupils: Pupils are equal, round, and reactive to light.  Cardiovascular:     Rate and Rhythm: Normal rate.  Pulmonary:     Effort: Pulmonary effort is normal. No respiratory distress.  Abdominal:     Palpations: Abdomen is soft.     Tenderness: There is no abdominal tenderness.  Skin:    General: Skin is warm.  Neurological:     Mental Status: She is alert and oriented to person, place, and time.     Comments: Weakened left grip strength, patient ambulatory, decreased sensation in left face and LUE  Psychiatric:        Mood and Affect: Mood normal.     ED Results / Procedures / Treatments   Labs (all labs ordered are listed, but only abnormal results are displayed) Labs Reviewed  CBC - Abnormal; Notable for the following components:      Result Value   WBC 12.4 (*)  Hemoglobin 11.0 (*)    HCT 35.0 (*)    MCV 75.8 (*)    MCH 23.8 (*)    RDW 16.5 (*)    All other components within normal limits  DIFFERENTIAL - Abnormal; Notable for the following components:   Eosinophils Absolute 0.6 (*)    All other components within normal limits  COMPREHENSIVE METABOLIC PANEL - Abnormal; Notable for the following components:   CO2 20 (*)    Glucose, Bld 100 (*)    Creatinine, Ser 1.07 (*)    Total Protein 8.4 (*)    All other components within normal limits  I-STAT CHEM 8, ED - Abnormal; Notable for the following components:   Calcium, Ion 1.12 (*)    All other components within normal limits  PROTIME-INR  APTT  ETHANOL  CBG MONITORING, ED  CBG MONITORING, ED  I-STAT BETA HCG BLOOD, ED ([facility], [facility], [facility] ONLY)     EKG None  Radiology CT HEAD CODE STROKE WO CONTRAST  Result Date: 12/04/2022 CLINICAL DATA:  Code stroke. EXAM: CT HEAD WITHOUT CONTRAST TECHNIQUE: Contiguous axial images were obtained from the base of the skull through the vertex without intravenous contrast. RADIATION DOSE REDUCTION: This exam was performed according to the departmental dose-optimization program which includes automated exposure control, adjustment of the mA and/or kV according to patient size and/or use of iterative reconstruction technique. COMPARISON:  None Available. FINDINGS: Brain: Cerebral volume within normal limits. No acute intracranial hemorrhage. No acute large vessel territory infarct. No mass lesion or midline shift. No hydrocephalus or extra-axial fluid collection. Partially empty sella noted. Vascular: No abnormal hyperdense vessel. Skull: Scalp soft tissues and calvarium within normal limits. Sinuses/Orbits: Globes and orbital soft tissues within normal limits. Scattered mucosal thickening present about the ethmoidal air cells and maxillary sinuses. No mastoid effusion. Other: None. ASPECTS [facility] Stroke Program Early CT Score) - Ganglionic level infarction (caudate, lentiform nuclei, internal capsule, insula, M1-M3 cortex): 7 - Supraganglionic infarction (M4-M6 cortex): 3 Total score (0-10 with 10 being normal): 10 IMPRESSION: 1. No acute intracranial abnormality. 2. ASPECTS is 10. 3. Partially empty sella, nonspecific, but can be seen with idiopathic intracranial hypertension. These results were communicated to Dr. Derry Lory at 8:27 pm on 12/04/2022 by text page via the [facility] messaging system. Electronically Signed   By: Rise Mu M.D.   On: 12/04/2022 20:28    Procedures .Critical Care  Performed by: Rozelle Logan, DO Authorized by: Rozelle Logan, DO   Critical care provider statement:    Critical care time (minutes):  30   Critical care time was exclusive of:  Separately billable  procedures and treating other patients   Critical care was necessary to treat or prevent imminent or life-threatening deterioration of the following conditions:  CNS failure or compromise   Critical care was time spent personally by me on the following activities:  Development of treatment plan with patient or surrogate, discussions with consultants, evaluation of patient's response to treatment, examination of patient, ordering and review of laboratory studies, ordering and review of radiographic studies, ordering and performing treatments and interventions, pulse oximetry, re-evaluation of patient's condition and review of old charts   I assumed direction of critical care for this patient from another provider in my specialty: no     Care discussed with: admitting provider       Medications Ordered in ED Medications  sodium chloride flush (NS) 0.9 % injection 3 mL (0 mLs Intravenous Hold 12/04/22 2018)  ED Course/ Medical Decision Making/ A&P Clinical Course as of 12/04/22 2116  Wynelle Link Dec 04, 2022  2017 HCT: 36.0 [OZ]    Clinical Course User Index [OZ] Smitty Knudsen, PA-C                             Medical Decision Making Amount and/or Complexity of Data Reviewed Labs: ordered. Radiology: ordered.  Risk Decision regarding hospitalization.   36 year old female presents emergency department sudden onset left-sided numbness.  Present on arrival, ongoing for about an hour.  Patient seen as a code stroke with neurology team at bedside.  Stroke head CT is negative.  Slightly hypertensive on arrival but vitals have trended to normal.  Blood work is reassuring without any acute abnormalities.  MRI shows microvascular changes.  This combined with medical history, obesity, everyday smoker our stroke neurologist is recommending admission for TIA workup.  Patient and husband at bedside agree with this disposition.  Patients evaluation and results requires admission for further treatment  and care.  Spoke with hospitalist, reviewed patient's ED course and they accept admission.  Patient agrees with admission plan, offers no new complaints and is stable/unchanged at time of admit.        Final Clinical Impression(s) / ED Diagnoses Final diagnoses:  None    Rx / DC Orders ED Discharge Orders     None         Rozelle Logan, DO 12/04/22 2310

## 2022-12-04 NOTE — Hospital Course (Signed)
Robin Perez is a 37 y.o. female with medical history significant for obesity and tobacco use who presented with left-sided numbness and admitted for TIA workup.

## 2022-12-04 NOTE — Code Documentation (Signed)
Responded to Code Stroke called at 2008 on pt in triage. Code Stroke was called for L sided numbness, LSN-1900. CBG-97, NIH-1 for L sensory deficit, CT head negative for acute changes. TNK not given-mild symptoms. Pt remains in TNK window until 2330. VS/neuro checks q39min until that time. Plan MRI.

## 2022-12-04 NOTE — ED Notes (Addendum)
Pt returned from MRI with RN. Neuro symptoms persist: slightly delayed speech, left sided numbness, mild weakness. Pt continues to appear tearful and processing information appears mildly difficult.

## 2022-12-04 NOTE — ED Notes (Signed)
Husband at bedside reports pt and himself were watching TV when she went to use the restroom and suddenly grabbed her head in pain. Husband reports that she then reported left sided numbness in upper and lower extremities and was "speaking like she was drunk." Denies drug or alcohol use.

## 2022-12-04 NOTE — H&P (Signed)
History and Physical    [name]:[id number] DOB: 10/17/1985 DOA: 12/04/2022  PCP: Toma Deiters, MD  Patient coming from: Home  I have personally briefly reviewed patient's old medical records in [facility]  Chief Complaint: Left sided numbness  HPI: Robin Perez is a 37 y.o. female with medical history significant for obesity and tobacco use who presented to the ED for evaluation of acute onset left-sided numbness.  Patient reports developing sudden onset numbness involving the left side of her head, face, and arm around 7 PM earlier today.  She was seen down watching TV when she stood up to use the restroom and developed the symptoms.  She also felt as if her left leg was "jelly" with decreased strength/coordination.  Her husband thought her speech was slurred.  He says he has had a TIA in the past therefore he was concerned that she was having the same and called EMS immediately.  He also noted that she appeared to have decreased grip strength in her left hand and she is left-hand dominant.  Patient denies any similar episodes in the past.  She says she is not taking any medications regularly.  She reports smoking 1/3 PPD.  She reports alcohol use about every other day.  She denies recreational drugs.  ED Course  Labs/Imaging on admission: I have personally reviewed following labs and imaging studies.  Initial vitals showed BP 163/91, pulse 95, RR 18, temp 98.4 F, SpO2 100% on room air.  Labs show WBC 12.4, hemoglobin 11.0, platelets 328,000, sodium 135, potassium 4.0, bicarb 20, BUN 13, creatinine 1.07, serum glucose 100, LFTs within normal limits.  I-STAT beta-hCG <5.0.  Serum ethanol <10.  CT head without contrast negative for acute intracranial abnormality.  Partially empty sella noted, nonspecific, but can be seen with idiopathic intracranial hypertension.  MRI brain without contrast negative for acute intracranial abnormality.  Few scattered subcentimeter  foci of T2/FLAIR hyperintensity involving subcortical white matter of the right greater than left frontal lobes noted.  Neurology consulted and recommended admission for TIA workup.  The hospitalist service was consulted to admit for further evaluation and management.  Review of Systems: All systems reviewed and are negative except as documented in history of present illness above.   Past Medical History:  Diagnosis Date   Asthma    Incisional pain 05/09/2013   Will rx cipro   Vertigo     Past Surgical History:  Procedure Laterality Date   CESAREAN SECTION     CESAREAN SECTION     CESAREAN SECTION WITH BILATERAL TUBAL LIGATION N/A 04/23/2013   Procedure: CESAREAN SECTION WITH BILATERAL TUBAL LIGATION;  Surgeon: Willodean Rosenthal, MD;  Location: [facility];  Service: Obstetrics;  Laterality: N/A;    Social History:  reports that she has quit smoking. Her smoking use included cigarettes. She smoked an average of .25 packs per day. She has never used smokeless tobacco. She reports current alcohol use. She reports that she does not use drugs.  No Known Allergies  History reviewed. No pertinent family history.   Prior to Admission medications   Medication Sig Start Date End Date Taking? Authorizing Provider  aspirin EC 81 MG tablet Take 81 mg by mouth daily.    [provider]  ibuprofen (ADVIL,MOTRIN) 800 MG tablet Take 1 tablet (800 mg total) by mouth every 8 (eight) hours as needed for cramping. 04/09/18   Terrilee Files, MD    Physical Exam: Vitals:   12/04/22  2045 12/04/22 2100 12/04/22 2145 12/04/22 2245  BP: 138/88 128/75 125/73 (!) 130/96  Pulse: 90 77 88   Resp: 15 19 17 15   Temp:      TempSrc:      SpO2: 95% 96% 97%   Weight:      Height:       Constitutional: Resting in bed, NAD, calm, comfortable Eyes: PERRL, EOMI, lids and conjunctivae normal ENMT: Mucous membranes are moist. Posterior pharynx clear of any exudate or lesions.Normal dentition.   Neck: normal, supple, no masses. Respiratory: clear to auscultation bilaterally, no wheezing, no crackles. Normal respiratory effort. No accessory muscle use.  Cardiovascular: Regular rate and rhythm, no murmurs / rubs / gallops. No extremity edema. 2+ pedal pulses. Abdomen: no tenderness, no masses palpated.  Musculoskeletal: no clubbing / cyanosis. No joint deformity upper and lower extremities. Good ROM, no contractures. Normal muscle tone.  Skin: no rashes, lesions, ulcers. No induration Neurologic: CN 2-12 grossly intact. Sensation intact. Strength 5/5 in all 4.  Psychiatric: Alert and oriented x 3. Normal mood.   EKG: Personally reviewed. Sinus rhythm, rate 83, no acute ischemic changes.  No prior for comparison.  Assessment/Plan Principal Problem:   Left sided numbness Active Problems:   Tobacco use   Robin Perez is a 37 y.o. female with medical history significant for obesity and tobacco use who presented with left-sided numbness and admitted for TIA workup.  Assessment and Plan: * Left sided numbness Presenting with acute onset left-sided numbness involving face, arm, leg.  Symptoms now resolved.  Admitted for workup of TIA. -CT head negative for acute intracranial abnormality -MRI brain negative for acute changes; nonspecific scattered subcentimeter foci hyperintensity involving right greater than left frontal lobes noted -Obtain MRA head -Carotid Dopplers -Start on aspirin 81 mg daily -Start Plavix 75 mg daily x 21 days -Obtain echocardiogram -Check A1c and lipid panel -Keep on telemetry, continue neurochecks -Allow permissive hypertension -PT/OT/SLP eval  Tobacco use She reports smoking 1/3 PPD.  Smoking cessation was advised.  She declines nicotine patch.  DVT prophylaxis: enoxaparin (LOVENOX) injection 40 mg Start: 12/04/22 2345 Code Status: Full code Family Communication: Husband at bedside Disposition Plan: From home and likely discharge to home pending  CVA workup Consults called: Neurology Severity of Illness: The appropriate patient status for this patient is OBSERVATION. Observation status is judged to be reasonable and necessary in order to provide the required intensity of service to ensure the patient's safety. The patient's presenting symptoms, physical exam findings, and initial radiographic and laboratory data in the context of their medical condition is felt to place them at decreased risk for further clinical deterioration. Furthermore, it is anticipated that the patient will be medically stable for discharge from the hospital within 2 midnights of admission.   Darreld Mclean MD [facility]  If 7PM-7AM, please contact night-coverage [email or web address]  12/04/2022, 11:38 PM

## 2022-12-04 NOTE — Assessment & Plan Note (Signed)
Presenting with acute onset left-sided numbness involving face, arm, leg.  Symptoms now resolved.  Admitted for workup of TIA. -CT head negative for acute intracranial abnormality -MRI brain negative for acute changes; nonspecific scattered subcentimeter foci hyperintensity involving right greater than left frontal lobes noted -Obtain MRA head -Carotid Dopplers -Start on aspirin 81 mg daily -Start Plavix 75 mg daily x 21 days -Obtain echocardiogram -Check A1c and lipid panel -Keep on telemetry, continue neurochecks -Allow permissive hypertension -PT/OT/SLP eval

## 2022-12-05 ENCOUNTER — Encounter (HOSPITAL_COMMUNITY): Payer: Medicaid Other

## 2022-12-05 ENCOUNTER — Observation Stay (HOSPITAL_BASED_OUTPATIENT_CLINIC_OR_DEPARTMENT_OTHER): Payer: Medicaid Other

## 2022-12-05 ENCOUNTER — Observation Stay (HOSPITAL_COMMUNITY): Payer: Medicaid Other

## 2022-12-05 ENCOUNTER — Encounter (HOSPITAL_COMMUNITY): Payer: Self-pay

## 2022-12-05 DIAGNOSIS — M7989 Other specified soft tissue disorders: Secondary | ICD-10-CM | POA: Diagnosis not present

## 2022-12-05 DIAGNOSIS — G459 Transient cerebral ischemic attack, unspecified: Secondary | ICD-10-CM

## 2022-12-05 DIAGNOSIS — R200 Anesthesia of skin: Secondary | ICD-10-CM | POA: Diagnosis not present

## 2022-12-05 LAB — LIPID PANEL
Cholesterol: 158 mg/dL (ref 0–200)
HDL: 63 mg/dL (ref >40)
LDL Cholesterol: 86 mg/dL (ref 0–99)
Total CHOL/HDL Ratio: 2.5 RATIO
Triglycerides: 45 mg/dL (ref <150)
VLDL: 9 mg/dL (ref 0–40)

## 2022-12-05 LAB — CBC
HCT: 30.9 % — ABNORMAL LOW (ref 36.0–46.0)
Hemoglobin: 9.8 g/dL — ABNORMAL LOW (ref 12.0–15.0)
MCH: 23.7 pg — ABNORMAL LOW (ref 26.0–34.0)
MCHC: 31.7 g/dL (ref 30.0–36.0)
MCV: 74.8 fL — ABNORMAL LOW (ref 80.0–100.0)
Platelets: 269 10*3/uL (ref 150–400)
RBC: 4.13 MIL/uL (ref 3.87–5.11)
RDW: 16.4 % — ABNORMAL HIGH (ref 11.5–15.5)
WBC: 9.2 10*3/uL (ref 4.0–10.5)
nRBC: 0 % (ref 0.0–0.2)

## 2022-12-05 LAB — BASIC METABOLIC PANEL
Anion gap: 9 (ref 5–15)
BUN: 15 mg/dL (ref 6–20)
CO2: 22 mmol/L (ref 22–32)
Calcium: 8.9 mg/dL (ref 8.9–10.3)
Chloride: 103 mmol/L (ref 98–111)
Creatinine, Ser: 1.06 mg/dL — ABNORMAL HIGH (ref 0.44–1.00)
GFR, Estimated: >60 mL/min (ref >60)
Glucose, Bld: 98 mg/dL (ref 70–99)
Potassium: 3.8 mmol/L (ref 3.5–5.1)
Sodium: 134 mmol/L — ABNORMAL LOW (ref 135–145)

## 2022-12-05 LAB — ECHOCARDIOGRAM COMPLETE
Area-P 1/2: 4.46 cm2
Calc EF: 67.2 %
Height: 67 in
S' Lateral: 2.5 cm
Single Plane A2C EF: 68.4 %
Single Plane A4C EF: 66.2 %
Weight: 3712 oz

## 2022-12-05 LAB — HEMOGLOBIN A1C
Hgb A1c MFr Bld: 5.6 % (ref 4.8–5.6)
Mean Plasma Glucose: 114.02 mg/dL

## 2022-12-05 LAB — RAPID URINE DRUG SCREEN, HOSP PERFORMED
Amphetamines: NOT DETECTED
Barbiturates: NOT DETECTED
Benzodiazepines: NOT DETECTED
Cocaine: POSITIVE — AB
Opiates: NOT DETECTED
Tetrahydrocannabinol: POSITIVE — AB

## 2022-12-05 LAB — HIV ANTIBODY (ROUTINE TESTING W REFLEX): HIV Screen 4th Generation wRfx: NONREACTIVE

## 2022-12-05 MED ORDER — ROSUVASTATIN CALCIUM 5 MG PO TABS
10.0000 mg | ORAL_TABLET | Freq: Every day | ORAL | Status: DC
  Administered 2022-12-05: 10 mg via ORAL
  Filled 2022-12-05: qty 2

## 2022-12-05 MED ORDER — IOHEXOL 350 MG/ML SOLN
75.0000 mL | Freq: Once | INTRAVENOUS | Status: AC | PRN
  Administered 2022-12-05: 75 mL via INTRAVENOUS

## 2022-12-05 MED ORDER — ASPIRIN 81 MG PO TBEC: 81.0000 mg | DELAYED_RELEASE_TABLET | Freq: Every day | ORAL | 0 refills | Status: AC

## 2022-12-05 MED ORDER — ROSUVASTATIN CALCIUM 10 MG PO TABS: 10.0000 mg | ORAL_TABLET | Freq: Every day | ORAL | 0 refills | Status: DC

## 2022-12-05 MED ORDER — CLOPIDOGREL BISULFATE 75 MG PO TABS: 75.0000 mg | ORAL_TABLET | Freq: Every day | ORAL | 0 refills | Status: AC

## 2022-12-05 NOTE — ED Notes (Signed)
..ED TO INPATIENT HANDOFF REPORT  ED Nurse Name and Phone #: [phone]  S Name/Age/Gender Robin Perez 37 y.o. female Room/Bed: 019C/019C  Code Status   Code Status: Full Code  Home/SNF/Other Home Patient oriented to: self, place, time, and situation Is this baseline? Yes   Triage Complete: Triage complete  Chief Complaint Left sided numbness [R20.0]  Triage Note Pt started an hour ago with left sided numbness and slurred speech    Allergies No Known Allergies  Level of Care/Admitting Diagnosis ED Disposition     ED Disposition  Admit   Condition  --   Comment  Hospital Area: [facility] [[id number]]  Level of Care: Telemetry Medical [104]  May place patient in observation at [facility] or [facility] if equivalent level of care is available:: No  Covid Evaluation: Asymptomatic - no recent exposure (last 10 days) testing not required  Diagnosis: Left sided numbness [722450]  Admitting Physician: Charlsie Quest [[id number]]  Attending Physician: Charlsie Quest [[id number]]          B Medical/Surgery History Past Medical History:  Diagnosis Date   Asthma    Incisional pain 05/09/2013   Will rx cipro   Vertigo    Past Surgical History:  Procedure Laterality Date   CESAREAN SECTION     CESAREAN SECTION     CESAREAN SECTION WITH BILATERAL TUBAL LIGATION N/A 04/23/2013   Procedure: CESAREAN SECTION WITH BILATERAL TUBAL LIGATION;  Surgeon: Willodean Rosenthal, MD;  Location: [facility];  Service: Obstetrics;  Laterality: N/A;     A IV Location/Drains/Wounds Patient Lines/Drains/Airways Status     Active Line/Drains/Airways     Name Placement date Placement time Site Days   Peripheral IV 12/04/22 20 G Left Antecubital 12/04/22  2013  Antecubital  1            Intake/Output Last 24 hours No intake or output data in the 24 hours ending 12/05/22 0420  Labs/Imaging Results for orders placed or performed during the hospital encounter of  12/04/22 (from the past 48 hour(s))  I-stat chem 8, ED     Status: Abnormal   Collection Time: 12/04/22  8:14 PM  Result Value Ref Range   Sodium 137 135 - 145 mmol/L   Potassium 4.1 3.5 - 5.1 mmol/L   Chloride 106 98 - 111 mmol/L   BUN 13 6 - 20 mg/dL   Creatinine, Ser 6.76 0.44 - 1.00 mg/dL   Glucose, Bld 97 70 - 99 mg/dL    Comment: Glucose reference range applies only to samples taken after fasting for at least 8 hours.   Calcium, Ion 1.12 (L) 1.15 - 1.40 mmol/L   TCO2 23 22 - 32 mmol/L   Hemoglobin 12.2 12.0 - 15.0 g/dL   HCT 19.5 09.3 - 26.7 %  Protime-INR     Status: None   Collection Time: 12/04/22  8:15 PM  Result Value Ref Range   Prothrombin Time 13.5 11.4 - 15.2 seconds   INR 1.0 0.8 - 1.2    Comment: (NOTE) INR goal varies based on device and disease states. Performed at [facility], [address], [place], [place] [address]   APTT     Status: None   Collection Time: 12/04/22  8:15 PM  Result Value Ref Range   aPTT 31 24 - 36 seconds    Comment: Performed at [facility], [address] [place], [place] [address]  CBC  Status: Abnormal   Collection Time: 12/04/22  8:15 PM  Result Value Ref Range   WBC 12.4 (H) 4.0 - 10.5 K/uL   RBC 4.62 3.87 - 5.11 MIL/uL   Hemoglobin 11.0 (L) 12.0 - 15.0 g/dL   HCT 16.1 (L) 09.6 - 04.5 %   MCV 75.8 (L) 80.0 - 100.0 fL   MCH 23.8 (L) 26.0 - 34.0 pg   MCHC 31.4 30.0 - 36.0 g/dL   RDW 40.9 (H) 81.1 - 91.4 %   Platelets 328 150 - 400 K/uL   nRBC 0.0 0.0 - 0.2 %    Comment: Performed at [facility], [address] [place], [place] [address]  Differential     Status: Abnormal   Collection Time: 12/04/22  8:15 PM  Result Value Ref Range   Neutrophils Relative % 54 %   Neutro Abs 6.7 1.7 - 7.7 K/uL   Lymphocytes Relative 33 %   Lymphs Abs 4.0 0.7 - 4.0 K/uL   Monocytes Relative 8 %   Monocytes Absolute 1.0 0.1 - 1.0 K/uL   Eosinophils Relative 5 %   Eosinophils Absolute 0.6 (H) 0.0 - 0.5 K/uL    Basophils Relative 0 %   Basophils Absolute 0.0 0.0 - 0.1 K/uL   Immature Granulocytes 0 %   Abs Immature Granulocytes 0.03 0.00 - 0.07 K/uL    Comment: Performed at [facility], [address] [place], [place] [address]  Comprehensive metabolic panel     Status: Abnormal   Collection Time: 12/04/22  8:15 PM  Result Value Ref Range   Sodium 135 135 - 145 mmol/L   Potassium 4.0 3.5 - 5.1 mmol/L   Chloride 103 98 - 111 mmol/L   CO2 20 (L) 22 - 32 mmol/L   Glucose, Bld 100 (H) 70 - 99 mg/dL    Comment: Glucose reference range applies only to samples taken after fasting for at least 8 hours.   BUN 13 6 - 20 mg/dL   Creatinine, Ser 8.65 (H) 0.44 - 1.00 mg/dL   Calcium 9.3 8.9 - 78.4 mg/dL   Total Protein 8.4 (H) 6.5 - 8.1 g/dL   Albumin 3.5 3.5 - 5.0 g/dL   AST 17 15 - 41 U/L   ALT 13 0 - 44 U/L   Alkaline Phosphatase 74 38 - 126 U/L   Total Bilirubin 0.6 0.3 - 1.2 mg/dL   GFR, Estimated >69 >62 mL/min    Comment: (NOTE) Calculated using the CKD-EPI Creatinine Equation (2021)    Anion gap 12 5 - 15    Comment: Performed at [facility], [address] [place], [place] [address]  Ethanol     Status: None   Collection Time: 12/04/22  8:15 PM  Result Value Ref Range   Alcohol, Ethyl (B) <10 <10 mg/dL    Comment: (NOTE) Lowest detectable limit for serum alcohol is 10 mg/dL.  For medical purposes only. Performed at [facility], [address] [place], [place] [address]   I-Stat beta hCG blood, ED     Status: None   Collection Time: 12/04/22  8:15 PM  Result Value Ref Range   I-stat hCG, quantitative <5.0 <5 mIU/mL   Comment 3            Comment:   GEST. AGE      CONC.  (mIU/mL)   <=1 WEEK        5 - 50     2  WEEKS       50 - 500     3 WEEKS       100 - 10,000     4 WEEKS     1,000 - 30,000        FEMALE AND NON-PREGNANT FEMALE:     LESS THAN 5 mIU/mL    MR BRAIN WO CONTRAST  Result Date: 12/04/2022 CLINICAL DATA:  Initial evaluation for neuro  deficit, stroke. EXAM: MRI HEAD WITHOUT CONTRAST TECHNIQUE: Multiplanar, multiecho pulse sequences of the brain and surrounding structures were obtained without intravenous contrast. COMPARISON:  CT from earlier the same day. FINDINGS: Brain: Cerebral volume within normal limits. Few scattered subcentimeter foci of T2/FLAIR hyperintensity noted involving the subcortical white matter of the right greater than left frontal lobes, nonspecific, but overall mild in nature. No evidence for acute or subacute ischemia. Gray-white matter differentiation maintained. No areas of chronic cortical infarction. No acute or chronic intracranial blood products. No mass lesion, midline shift or mass effect. No hydrocephalus or extra-axial fluid collection. Partially empty sella noted. Vascular: Major intracranial vascular flow voids are maintained. Skull and upper cervical spine: Craniocervical junction within normal limits. Diffuse loss of normal bone marrow signal, nonspecific but can be seen with anemia, smoking, obesity, and infiltrative/myelofibrotic marrow processes. No scalp soft tissue abnormality. Sinuses/Orbits: Globes and orbital soft tissues within normal limits. Mild scattered mucosal thickening present about the ethmoidal air cells and maxillary sinuses. Other: Mildly prominent retropharyngeal lymph nodes noted, largest of which measures 1.2 cm on the left (series 10, image 1), indeterminate, but could be reactive. IMPRESSION: 1. No acute intracranial abnormality. 2. Few scattered subcentimeter foci of T2/FLAIR hyperintensity involving the subcortical white matter of the right greater than left frontal lobes, nonspecific, but overall mild in nature. Primary considerations include changes of chronic microvascular ischemic disease versus complicated migraines. Appearance would not be typical for demyelinating disease. 3. Partially empty sella, nonspecific, but can be seen with idiopathic intracranial hypertension.  Electronically Signed   By: Rise Mu M.D.   On: 12/04/2022 22:17   CT HEAD CODE STROKE WO CONTRAST  Result Date: 12/04/2022 CLINICAL DATA:  Code stroke. EXAM: CT HEAD WITHOUT CONTRAST TECHNIQUE: Contiguous axial images were obtained from the base of the skull through the vertex without intravenous contrast. RADIATION DOSE REDUCTION: This exam was performed according to the departmental dose-optimization program which includes automated exposure control, adjustment of the mA and/or kV according to patient size and/or use of iterative reconstruction technique. COMPARISON:  None Available. FINDINGS: Brain: Cerebral volume within normal limits. No acute intracranial hemorrhage. No acute large vessel territory infarct. No mass lesion or midline shift. No hydrocephalus or extra-axial fluid collection. Partially empty sella noted. Vascular: No abnormal hyperdense vessel. Skull: Scalp soft tissues and calvarium within normal limits. Sinuses/Orbits: Globes and orbital soft tissues within normal limits. Scattered mucosal thickening present about the ethmoidal air cells and maxillary sinuses. No mastoid effusion. Other: None. ASPECTS [facility] Stroke Program Early CT Score) - Ganglionic level infarction (caudate, lentiform nuclei, internal capsule, insula, M1-M3 cortex): 7 - Supraganglionic infarction (M4-M6 cortex): 3 Total score (0-10 with 10 being normal): 10 IMPRESSION: 1. No acute intracranial abnormality. 2. ASPECTS is 10. 3. Partially empty sella, nonspecific, but can be seen with idiopathic intracranial hypertension. These results were communicated to Dr. Derry Lory at 8:27 pm on 12/04/2022 by text page via the [facility] messaging system. Electronically Signed   By: Rise Mu M.D.   On: 12/04/2022 20:28    Pending  Labs [employer] (From admission, onward)     Start     Ordered   12/05/22 0500  HIV Antibody (routine testing w rflx)  (HIV Antibody (Routine testing w reflex) panel)  Tomorrow  morning,   R        12/04/22 2332   12/05/22 0500  Hemoglobin A1c  (Labs)  Tomorrow morning,   R       Comments: To assess prior glycemic control    12/04/22 2332   12/05/22 0500  CBC  Tomorrow morning,   R        12/04/22 2332   12/05/22 0500  Basic metabolic panel  Tomorrow morning,   R        12/04/22 2332   12/05/22 0349  Lipid panel  Once,   R        12/05/22 0349            Vitals/Pain Today's Vitals   12/05/22 0200 12/05/22 0230 12/05/22 0300 12/05/22 0330  BP: 116/78 117/81 120/79 113/75  Pulse: 74 88 78 73  Resp: Temp:    98.7 F (37.1 C)  TempSrc:      SpO2: 96% 95% 96% 93%  Weight:      Height:      PainSc:        Isolation Precautions No active isolations  Medications Medications  sodium chloride flush (NS) 0.9 % injection 3 mL (0 mLs Intravenous Hold 12/04/22 2018)   stroke: early stages of recovery book (has no administration in time range)  acetaminophen (TYLENOL) tablet 650 mg (has no administration in time range)    Or  acetaminophen (TYLENOL) 160 MG/5ML solution 650 mg (has no administration in time range)    Or  acetaminophen (TYLENOL) suppository 650 mg (has no administration in time range)  senna-docusate (Senokot-S) tablet 1 tablet (has no administration in time range)  enoxaparin (LOVENOX) injection 40 mg (has no administration in time range)  ondansetron (ZOFRAN) injection 4 mg (has no administration in time range)  aspirin EC tablet 81 mg (81 mg Oral Given 12/05/22 0024)  clopidogrel (PLAVIX) tablet 75 mg (75 mg Oral Given 12/05/22 0024)    Mobility walks     Focused Assessments Neuro Assessment Handoff:  Swallow screen pass? Yes    NIH Stroke Scale  Dizziness Present: No Headache Present: No Interval: Other (Comment) Level of Consciousness (1a.)   : Alert, keenly responsive LOC Questions (1b. )   : Answers both questions correctly LOC Commands (1c. )   : Performs both tasks correctly Best Gaze (2. )  :  Normal Visual (3. )  : No visual loss Facial Palsy (4. )    : Normal symmetrical movements Motor Arm, Left (5a. )   : No drift Motor Arm, Right (5b. ) : No drift Motor Leg, Left (6a. )  : No drift Motor Leg, Right (6b. ) : No drift Limb Ataxia (7. ): Absent Sensory (8. )  : Normal, no sensory loss Best Language (9. )  : No aphasia Dysarthria (10. ): Normal Extinction/Inattention (11.)   : No Abnormality Complete NIHSS TOTAL: 0 Last date known well: 12/04/22 Last time known well: 1900 Neuro Assessment: Within Defined Limits Neuro Checks:   Initial (12/04/22 2008)  Has TPA been given? No If patient is a Neuro Trauma and patient is going to OR before floor call report to 4N Charge nurse: [phone] or [phone]   R Recommendations: See Admitting Provider  Note  Report given to:   Additional Notes: pt is alert and oriented x 4 said she feels back to base line 4 oclock NIH was 0

## 2022-12-05 NOTE — Progress Notes (Signed)
OT Cancellation Note  Patient Details Name: Robin Perez MRN: [id number] DOB: [birth year]   Cancelled Treatment:    Reason Eval/Treat Not Completed: Patient's level of consciousness (Pt sleeping soundly, unable to rouse despite maximal effort. OT evaluation to f/u later today.)  Donia Pounds 12/05/2022, 8:34 AM

## 2022-12-05 NOTE — Progress Notes (Signed)
Echocardiogram 2D Echocardiogram has been performed.  Toni Amend 12/05/2022, 1:51 PM

## 2022-12-05 NOTE — Progress Notes (Signed)
PT Cancellation Note  Patient Details Name: Robin Perez MRN: [id number] DOB: December 02, 1985   Cancelled Treatment:    Reason Eval/Treat Not Completed: Patient at procedure or test/unavailable  Patient leaving with transport going to CT. Will return later today   Jerolyn Center, PT Acute Rehabilitation Services  Office [phone]   Zena Amos 12/05/2022, 10:28 AM

## 2022-12-05 NOTE — Progress Notes (Signed)
OT Cancellation Note  Patient Details Name: Robin Perez MRN: [id number] DOB: 1986-04-03   Cancelled Treatment:    Reason Eval/Treat Not Completed: OT screened, no needs identified, will sign off (Pt is back to baseline - no OT needs. Thank you.)  Donia Pounds 12/05/2022, 4:08 PM

## 2022-12-05 NOTE — Progress Notes (Signed)
SLP Cancellation Note  Patient Details Name: Robin Perez MRN: [id number] DOB: 11/30/1985   Cancelled treatment:       Reason Eval/Treat Not Completed: SLP screened, no needs identified, will sign off; pt informed SLP symptoms have resolved; MRI negative for acute processes.    Pat Zarek Relph,M.S., CCC-SLP 12/05/2022, 11:20 AM

## 2022-12-05 NOTE — Progress Notes (Addendum)
STROKE TEAM PROGRESS NOTE   INTERVAL HISTORY No family at the bedside.  [name] Patient states yesterday she developed acute onset of left arm numbness and slurred speech which resolved in about 2 hours.  She denies having a headache at this time, weakness, facial droop, or vision changes MRI brain negative for acute process CTA head and neck is pending.  Will check bilateral lower extremity ultrasound rule out DVT, and will check TCD with bubble study Will check a urine drug screen today Continue aspirin 81 mg and Plavix 75 mg daily for 3 weeks. LDL 86, will not start a statin due to LDL is below 100 and due to the teratogenic effects in the event she becomes pregnant  Vitals:   12/05/22 0430 12/05/22 0524 12/05/22 0532 12/05/22 0809  BP: 114/75 (!) 126/104 129/85 119/67  Pulse: 83 71 62 68  Resp: Temp: 98.7 F (37.1 C) 98.1 F (36.7 C) 98.2 F (36.8 C) 97.8 F (36.6 C)  TempSrc:   Oral Oral  SpO2: 94% 99% 100% 99%  Weight:      Height:       CBC:  Recent Labs  Lab 12/04/22 2015 12/05/22 0349  WBC 12.4* 9.2  NEUTROABS 6.7  --   HGB 11.0* 9.8*  HCT 35.0* 30.9*  MCV 75.8* 74.8*  PLT 328 269   Basic Metabolic Panel:  Recent Labs  Lab 12/04/22 2015 12/05/22 0349  NA 135 134*  K 4.0 3.8  CL 103 103  CO2 20* 22  GLUCOSE 100* 98  BUN 13 15  CREATININE 1.07* 1.06*  CALCIUM 9.3 8.9   Lipid Panel:  Recent Labs  Lab 12/05/22 0349  CHOL 158  TRIG 45  HDL 63  CHOLHDL 2.5  VLDL 9  LDLCALC 86   HgbA1c:  Recent Labs  Lab 12/05/22 0349  HGBA1C 5.6   Urine Drug Screen: No results for input(s): "LABOPIA", "COCAINSCRNUR", "LABBENZ", "AMPHETMU", "THCU", "LABBARB" in the last 168 hours.  Alcohol Level  Recent Labs  Lab 12/04/22 2015  ETH <10    IMAGING past 24 hours MR BRAIN WO CONTRAST  Result Date: 12/04/2022 CLINICAL DATA:  Initial evaluation for neuro deficit, stroke. EXAM: MRI HEAD  WITHOUT CONTRAST TECHNIQUE: Multiplanar, multiecho pulse sequences of the brain and surrounding structures were obtained without intravenous contrast. COMPARISON:  CT from earlier the same day. FINDINGS: Brain: Cerebral volume within normal limits. Few scattered subcentimeter foci of T2/FLAIR hyperintensity noted involving the subcortical white matter of the right greater than left frontal lobes, nonspecific, but overall mild in nature. No evidence for acute or subacute ischemia. Gray-white matter differentiation maintained. No areas of chronic cortical infarction. No acute or chronic intracranial blood products. No mass lesion, midline shift or mass effect. No hydrocephalus or extra-axial fluid collection. Partially empty sella noted. Vascular: Major intracranial vascular flow voids are maintained. Skull and upper cervical spine: Craniocervical junction within normal limits. Diffuse loss of normal bone marrow signal, nonspecific but can be seen with anemia, smoking, obesity, and infiltrative/myelofibrotic marrow processes. No scalp soft tissue abnormality. Sinuses/Orbits: Globes and orbital soft tissues within normal limits. Mild scattered mucosal thickening present about the ethmoidal air cells and maxillary sinuses. Other: Mildly prominent retropharyngeal lymph nodes noted, largest of which measures 1.2 cm on the left (series 10, image 1), indeterminate, but could be reactive. IMPRESSION: 1. No acute intracranial abnormality. 2. Few scattered subcentimeter foci of T2/FLAIR hyperintensity involving the  subcortical white matter of the right greater than left frontal lobes, nonspecific, but overall mild in nature. Primary considerations include changes of chronic microvascular ischemic disease versus complicated migraines. Appearance would not be typical for demyelinating disease. 3. Partially empty sella, nonspecific, but can be seen with idiopathic intracranial hypertension. Electronically Signed   By: Rise Mu M.D.   On: 12/04/2022 22:17   CT HEAD CODE STROKE WO CONTRAST  Result Date: 12/04/2022 CLINICAL DATA:  Code stroke. EXAM: CT HEAD WITHOUT CONTRAST TECHNIQUE: Contiguous axial images were obtained from the base of the skull through the vertex without intravenous contrast. RADIATION DOSE REDUCTION: This exam was performed according to the departmental dose-optimization program which includes automated exposure control, adjustment of the mA and/or kV according to patient size and/or use of iterative reconstruction technique. COMPARISON:  None Available. FINDINGS: Brain: Cerebral volume within normal limits. No acute intracranial hemorrhage. No acute large vessel territory infarct. No mass lesion or midline shift. No hydrocephalus or extra-axial fluid collection. Partially empty sella noted. Vascular: No abnormal hyperdense vessel. Skull: Scalp soft tissues and calvarium within normal limits. Sinuses/Orbits: Globes and orbital soft tissues within normal limits. Scattered mucosal thickening present about the ethmoidal air cells and maxillary sinuses. No mastoid effusion. Other: None. ASPECTS [facility] Stroke Program Early CT Score) - Ganglionic level infarction (caudate, lentiform nuclei, internal capsule, insula, M1-M3 cortex): 7 - Supraganglionic infarction (M4-M6 cortex): 3 Total score (0-10 with 10 being normal): 10 IMPRESSION: 1. No acute intracranial abnormality. 2. ASPECTS is 10. 3. Partially empty sella, nonspecific, but can be seen with idiopathic intracranial hypertension. These results were communicated to Dr. Derry Lory at 8:27 pm on 12/04/2022 by text page via the [facility] messaging system. Electronically Signed   By: Rise Mu M.D.   On: 12/04/2022 20:28    PHYSICAL EXAM  Temp:  [97.8 F (36.6 C)-98.7 F (37.1 C)] 98.2 F (36.8 C) (04/15 1226) Pulse Rate:  [62-95] 72 (04/15 1226) Resp:  [14-20] 18 (04/15 1226) BP: (110-163)/(63-104) 110/63 (04/15 1226) SpO2:  [93 %-100 %] 100  % (04/15 1226) Weight:  [105.2 kg] 105.2 kg (04/14 2000)  General - Well nourished, well developed, in no apparent distress. Cardiovascular - Regular rhythm and rate.  Mental Status -  Level of arousal and orientation to time, place, and person were intact. Language including expression, naming, repetition, comprehension was assessed and found intact. Attention span and concentration were normal. Recent and remote memory were intact. Fund of Knowledge was assessed and was intact.  Cranial Nerves II - XII - II - Visual field intact OU. III, IV, VI - Extraocular movements intact. V - Facial sensation intact bilaterally. VII - Facial movement intact bilaterally. VIII - Hearing & vestibular intact bilaterally. X - Palate elevates symmetrically. XI - Chin turning & shoulder shrug intact bilaterally. XII - Tongue protrusion intact.  Motor Strength - The patient's strength was normal in all extremities and pronator drift was absent.  Bulk was normal and fasciculations were absent.   Motor Tone - Muscle tone was assessed at the neck and appendages and was normal.  Sensory - Light touch, temperature/pinprick were assessed and were symmetrical.    Coordination - The patient had normal movements in the hands and feet with no ataxia or dysmetria.  Tremor was absent.  Gait and Station - deferred.  ASSESSMENT/PLAN Robin Perez is a 37 y.o. female with history of asthma, obesity and tobacco use, alcohol use, THC use presented to the emergency room for acute  onset of left arm numbness and slurred speech  Possible TIA Code Stroke  CT head No acute abnormality.  ASPECTS 10.    CTA head & neck pending MRI no acute abnormality 2D Echo EF 60-65% TCD with bubble study no PFO LE venous doppler no DVT LDL 86 HgbA1c 5.6 UDS pending VTE prophylaxis -Lovenox No antithrombotics prior to admission, now on aspirin 81 mg and Plavix 75 mg daily for 3 weeks then aspirin alone Therapy  recommendations: Pending Disposition: Pending  Hypertension Home meds: None Stable Long-term BP goal normotensive  Hyperlipidemia Home meds: None LDL 86, goal < 70 Put on crestor 10 No high intensity statin given LDL near goal and at child bearing age Statin education with pregnancy precaution education provided. Pt and husband are aware.  Continue statin at discharge  Tobacco abuse Alcohol use THC use Smoking/alcohol/THC cessation education provided Declines nicotine patch  Enlarged adenoids  CTA showed prominent adenoid tissue obstructing the nasopharynx, similar to prior CT performed in 2016. Need outpt ENT consult  Other Stroke Risk Factors Obesity, Body mass index is 36.34 kg/m., BMI >/= 30 associated with increased stroke risk, recommend weight loss, diet and exercise as appropriate   Hospital day # 0  Gevena Mart DNP, ACNPC-AG  [facility]  ATTENDING NOTE: I reviewed above note and agree with the assessment and plan. Pt was seen and examined.   Husband at bedside.  Patient lying bed, stated that she was watching TV yesterday had acute onset left arm numbness and then spread to left leg and left face.  Lasted 1 hour and resolved.  So far CT and MRI negative.  LDL 86, A1c 5.6, CT head and neck neg for vascular disease and UDS pending.  Patient endorsed cigarette smoking, 4 drinks alcohol every day and THC use.  Cessation education provided, patient waiting to quit.  Husband stated the patient had right lower extremity lymphedema in the past, but LE venous Doppler and TCD bubble study both negative.  2D echo unremarkable.  Currently on DAPT for 3 weeks and then aspirin alone.  Started low-dose statin for LDL goal less than 70.  Given her childbearing age, recommend stop statin 2 months before planned pregnancy or stop right away if in pregnancy.  Aggressive risk factor modification. Outpt ENT referral for enlarged adenoid tissue blocking nasopharynx.   For  detailed assessment and plan, please refer to above/below as I have made changes wherever appropriate.   Neurology will sign off. Please call with questions. Pt will follow up with stroke clinic NP at [facility] in about 4 weeks. Thanks for the consult.   Marvel Plan, MD PhD Stroke Neurology 12/05/2022 1:42 PM  I discussed with Dr. Quincy Sheehan. I spent extensive face-to-face time with the patient and her husband, more than 50% of which was spent in counseling and coordination of care, reviewing test results, images and medication, and discussing the diagnosis, treatment plan and potential prognosis. This patient's care requiresreview of multiple databases, neurological assessment, discussion with family, other specialists and medical decision making of high complexity.      To contact Stroke Continuity provider, please refer to [email or web address]. After hours, contact General Neurology

## 2022-12-05 NOTE — Discharge Summary (Signed)
Physician Discharge Summary   Patient: Robin Perez MRN: [id number] DOB: [birth year]  Admit date:     12/04/2022  Discharge date: 12/05/22  Discharge Physician: Harold Hedge   PCP: Robin Deiters, MD   Recommendations at discharge:   Follow-up with PCP in 1 week 2.  Follow-up with [facility] 3.  Follow-up with ENT in 2 weeks for enlarged adenoid tissue blocking the nasopharynx,   Discharge Diagnoses: Principal Problem:   Left sided numbness resolved Active Problems:   Tobacco use   Hospital Course: [name] is a 37 y.o. female with medical history significant for obesity and tobacco use who presented with left-sided numbness and admitted for TIA workup.  Assessment and Plan: Patient was admitted by the hospitalist team and neurologist were consulted.  Patient presents with left-sided numbness involving the face arm and leg but soon the symptoms resolved.  Initial CT of the head was negative for any acute intracranial abnormality.  Patient was further evaluated with MRI of the brain that was negative for acute changes and negative for any acute infarct.  Subsequently per the recommendation of the neurologist TIA workup was initiated.  Echocardiogram showed preserved ejection fraction within normal no intracardiac shunt, lower extremity DVT evaluation was negative.  CT of the head and neck was negative for any stenosis in the neck and intracranial.  PT OT evaluated the patient.  Neurologist advised the patient to be discharged on aspirin and Plavix for 21 days and subsequently just aspirin.  Also started the patient on small dose of statin.  Patient has been advised to refrain from smoking and use of THC.  Patient will need to follow-up with PCP in 1 week, neurologist and since there is also adenoid blocking the nasopharynx ENT referral has been provided.          Consultants: Neurologist Procedures performed: None Disposition: Home Diet recommendation:   Discharge Diet Orders (From admission, onward)     Start     Ordered   12/05/22 0000  Diet - low sodium heart healthy        12/05/22 1532           Regular diet DISCHARGE MEDICATION: Allergies as of 12/05/2022   No Known Allergies      Medication List     TAKE these medications    aspirin EC 81 MG tablet Take 1 tablet (81 mg total) by mouth daily. Swallow whole. Start taking on: December 06, 2022   clopidogrel 75 MG tablet Commonly known as: PLAVIX Take 1 tablet (75 mg total) by mouth daily for 21 days. Start taking on: December 06, 2022   rosuvastatin 10 MG tablet Commonly known as: CRESTOR Take 1 tablet (10 mg total) by mouth daily. Start taking on: December 06, 2022        Follow-up Information     [facility]. Schedule an appointment as soon as possible for a visit in 1 month(s).   Specialty: Neurology Why: stroke clinic Contact information: [address] [place] [address] [phone]        Robin Deiters, MD Follow up in 1 week(s).   Specialty: Internal Medicine Contact information: [address] [place] [address] [phone]         [facility] Follow up in 2 week(s).                 Discharge Exam: Filed Weights   12/04/22 2000  Weight: 105.2 kg   Patient was seen and examined at the time of discharge.  Patient was alert and oriented without any distress CVS: S1-S2 positive Respiratory: Bilateral clear and equal breath sounds. Neuro: No focal neurological deficits identified.  Condition at discharge: fair  The results of significant diagnostics from this hospitalization (including imaging, microbiology, ancillary and laboratory) are listed below for reference.   Imaging Studies: VAS [place] TRANSCRANIAL DOPPLER W BUBBLES  Result Date: 12/05/2022  Transcranial Doppler with Bubble Patient Name:  Robin Perez  Date of Exam:   12/05/2022 Medical Rec #: [id number]          Accession #:    [id number] Date of Birth: 04/11/1986        Patient Gender: F Patient Age:   37 years Exam Location:  [facility] Procedure:      VAS [place] TRANSCRANIAL DOPPLER W BUBBLES Referring Phys: Scheryl Marten XU --------------------------------------------------------------------------------  Indications: TIA. History: Obesity and tobacco use who presented to the ED for evaluation of acute onset left sided numbness. Performing Technologist: Marilynne Halsted RDMS, RVT  Examination Guidelines: A complete evaluation includes B-mode imaging, spectral Doppler, color Doppler, and power Doppler as needed of all accessible portions of each vessel. Bilateral testing is considered an integral part of a complete examination. Limited examinations for reoccurring indications may be performed as noted.  Summary: No HITS at rest or during Valsalva. Negative transcranial Doppler Bubble study with no evidence of right to left intracardiac communication.  A vascular evaluation was performed. The right middle cerebral artery was studied. An IV was inserted into the patient's left Forearm. Verbal informed consent was obtained.  *See table(s) above for TCD measurements and observations.    Preliminary    VAS [place] LOWER EXTREMITY VENOUS (DVT)  Result Date: 12/05/2022  Lower Venous DVT Study Patient Name:  Robin Perez  Date of Exam:   12/05/2022 Medical Rec #: [id number]         Accession #:    [id number] Date of Birth: 1986/04/09        Patient Gender: F Patient Age:   34 years Exam Location:  [facility] Procedure:      VAS [place] LOWER EXTREMITY VENOUS (DVT) Referring Phys: Scheryl Marten XU --------------------------------------------------------------------------------  Indications: Swelling. Other Indications: TIA. Comparison Study: 11/17/17 - Negative RLE venous Performing Technologist: [place] RDMS, RVT  Examination Guidelines: A complete evaluation includes B-mode imaging, spectral Doppler, color Doppler, and power  Doppler as needed of all accessible portions of each vessel. Bilateral testing is considered an integral part of a complete examination. Limited examinations for reoccurring indications may be performed as noted. The reflux portion of the exam is performed with the patient in reverse Trendelenburg.  +---------+---------------+---------+-----------+----------+--------------+ RIGHT    CompressibilityPhasicitySpontaneityPropertiesThrombus Aging +---------+---------------+---------+-----------+----------+--------------+ CFV      Full           Yes      Yes                                 +---------+---------------+---------+-----------+----------+--------------+ SFJ      Full                                                        +---------+---------------+---------+-----------+----------+--------------+ FV Prox  Full                                                        +---------+---------------+---------+-----------+----------+--------------+  FV Mid   Full                                                        +---------+---------------+---------+-----------+----------+--------------+ FV DistalFull                                                        +---------+---------------+---------+-----------+----------+--------------+ PFV      Full                                                        +---------+---------------+---------+-----------+----------+--------------+ POP      Full           Yes      Yes                                 +---------+---------------+---------+-----------+----------+--------------+ PTV      Full                                                        +---------+---------------+---------+-----------+----------+--------------+ PERO     Full                                                        +---------+---------------+---------+-----------+----------+--------------+    +----+---------------+---------+-----------+----------+--------------+ LEFTCompressibilityPhasicitySpontaneityPropertiesThrombus Aging +----+---------------+---------+-----------+----------+--------------+ CFV Full           Yes      Yes                                 +----+---------------+---------+-----------+----------+--------------+ SFJ Full                                                        +----+---------------+---------+-----------+----------+--------------+    Summary: RIGHT: - There is no evidence of deep vein thrombosis in the lower extremity.  - No cystic structure found in the popliteal fossa.  LEFT: - No evidence of common femoral vein obstruction.  *See table(s) above for measurements and observations.    Preliminary    ECHOCARDIOGRAM COMPLETE  Result Date: 12/05/2022    ECHOCARDIOGRAM REPORT   Patient Name:   PAITEN BOIES Date of Exam: 12/05/2022 Medical Rec #:  [id number]        Height:       67.0 in Accession #:    [id number]       Weight:       232.0 lb  Date of Birth:  [birth year]       BSA:          2.154 m Patient Age:    36 years         BP:           110/63 mmHg Patient Gender: F                HR:           73 bpm. Exam Location:  Inpatient Procedure: 2D Echo, Cardiac Doppler and Color Doppler Indications:    TIA  History:        Patient has no prior history of Echocardiogram examinations.  Sonographer:    Mike Gip Referring Phys: [id number] VISHAL R PATEL IMPRESSIONS  1. Left ventricular ejection fraction, by estimation, is 60 to 65%. The left ventricle has normal function. The left ventricle has no regional wall motion abnormalities. There is mild concentric left ventricular hypertrophy. Left ventricular diastolic parameters were normal.  2. Right ventricular systolic function is normal. The right ventricular size is normal.  3. The mitral valve is normal in structure. No evidence of mitral valve regurgitation. No evidence of mitral stenosis.  4. The aortic  valve is normal in structure. Aortic valve regurgitation is not visualized. No aortic stenosis is present.  5. The inferior vena cava is normal in size with greater than 50% respiratory variability, suggesting right atrial pressure of 3 mmHg. FINDINGS  Left Ventricle: Left ventricular ejection fraction, by estimation, is 60 to 65%. The left ventricle has normal function. The left ventricle has no regional wall motion abnormalities. The left ventricular internal cavity size was normal in size. There is  mild concentric left ventricular hypertrophy. Left ventricular diastolic parameters were normal. Right Ventricle: The right ventricular size is normal. No increase in right ventricular wall thickness. Right ventricular systolic function is normal. Left Atrium: Left atrial size was normal in size. Right Atrium: Right atrial size was normal in size. Pericardium: There is no evidence of pericardial effusion. Mitral Valve: The mitral valve is normal in structure. No evidence of mitral valve regurgitation. No evidence of mitral valve stenosis. Tricuspid Valve: The tricuspid valve is normal in structure. Tricuspid valve regurgitation is not demonstrated. No evidence of tricuspid stenosis. Aortic Valve: The aortic valve is normal in structure. Aortic valve regurgitation is not visualized. No aortic stenosis is present. Pulmonic Valve: The pulmonic valve was normal in structure. Pulmonic valve regurgitation is not visualized. No evidence of pulmonic stenosis. Aorta: The aortic root is normal in size and structure. Venous: The inferior vena cava is normal in size with greater than 50% respiratory variability, suggesting right atrial pressure of 3 mmHg. IAS/Shunts: No atrial level shunt detected by color flow Doppler.  LEFT VENTRICLE PLAX 2D LVIDd:         4.70 cm      Diastology LVIDs:         2.50 cm      LV e' medial:    10.80 cm/s LV PW:         1.10 cm      LV E/e' medial:  6.5 LV IVS:        1.20 cm      LV e' lateral:    12.20 cm/s LVOT diam:     2.20 cm      LV E/e' lateral: 5.7 LV SV:         108 LV SV Index:   50 LVOT Area:  3.80 cm  LV Volumes (MOD) LV vol d, MOD A2C: 117.0 ml LV vol d, MOD A4C: 107.0 ml LV vol s, MOD A2C: 37.0 ml LV vol s, MOD A4C: 36.2 ml LV SV MOD A2C:     80.0 ml LV SV MOD A4C:     107.0 ml LV SV MOD BP:      76.3 ml RIGHT VENTRICLE             IVC RV Basal diam:  3.80 cm     IVC diam: 1.00 cm RV S prime:     11.40 cm/s TAPSE (M-mode): 2.2 cm LEFT ATRIUM             Index        RIGHT ATRIUM           Index LA diam:        3.60 cm 1.67 cm/m   RA Area:     17.40 cm LA Vol (A2C):   53.3 ml 24.75 ml/m  RA Volume:   47.40 ml  22.01 ml/m LA Vol (A4C):   63.1 ml 29.30 ml/m LA Biplane Vol: 61.7 ml 28.65 ml/m  AORTIC VALVE LVOT Vmax:   132.00 cm/s LVOT Vmean:  88.100 cm/s LVOT VTI:    0.284 m  AORTA Ao Root diam: 3.40 cm Ao Asc diam:  3.00 cm MITRAL VALVE MV Area (PHT): 4.46 cm    SHUNTS MV Decel Time: 170 msec    Systemic VTI:  0.28 m MV E velocity: 69.80 cm/s  Systemic Diam: 2.20 cm MV A velocity: 47.50 cm/s MV E/A ratio:  1.47 Kardie Tobb DO Electronically signed by Thomasene Ripple DO Signature Date/Time: 12/05/2022/2:15:45 PM    Final    CT ANGIO HEAD NECK W WO CM  Result Date: 12/05/2022 EXAM: CT ANGIOGRAPHY HEAD AND NECK WITH AND WITHOUT CONTRAST TECHNIQUE: Multidetector CT imaging of the head and neck was performed using the standard protocol during bolus administration of intravenous contrast. Multiplanar CT image reconstructions and MIPs were obtained to evaluate the vascular anatomy. Carotid stenosis measurements (when applicable) are obtained utilizing NASCET criteria, using the distal internal carotid diameter as the denominator. RADIATION DOSE REDUCTION: This exam was performed according to the departmental dose-optimization program which includes automated exposure control, adjustment of the mA and/or kV according to patient size and/or use of iterative reconstruction technique. CONTRAST:   75mL OMNIPAQUE IOHEXOL 350 MG/ML SOLN COMPARISON:  CT and MRI December 04, 2022. FINDINGS: CT HEAD FINDINGS Brain: No evidence of acute infarction, hemorrhage, hydrocephalus, extra-axial collection or mass lesion/mass effect. Vascular: No hyperdense vessel or unexpected calcification. Skull: Normal. Negative for fracture or focal lesion. Sinuses/Orbits: Mild mucosal thickening throughout the paranasal sinuses. Bilateral proptosis. Other: None. Review of the MIP images confirms the above findings CTA NECK FINDINGS Aortic arch: Common origin of the innominate and left common carotid artery from the aortic arch. Imaged portion shows no evidence of aneurysm or dissection. No significant stenosis of the major arch vessel origins. Right carotid system: No evidence of dissection, stenosis (50% or greater), or occlusion. Left carotid system: No evidence of dissection, stenosis (50% or greater), or occlusion. Vertebral arteries: Left dominant. Proximal left vertebral artery is obscured by artifact from dense contrast in adjacent veins. Otherwise, no evidence of dissection, stenosis (50% or greater), or occlusion. Skeleton: Negative. Other neck: Prominent adenoid tissue obstructing the nasopharynx, similar to prior CT performed in 2016. Mildly prominent lymph nodes in the bilateral anterior and posterior triangles with a 1.3  cm left retropharyngeal lymph node. Upper chest: Negative. Review of the MIP images confirms the above findings CTA HEAD FINDINGS Anterior circulation: No significant stenosis, proximal occlusion, aneurysm, or vascular malformation. Posterior circulation: No significant stenosis, proximal occlusion, aneurysm, or vascular malformation. Venous sinuses: As permitted by contrast timing, patent. Anatomic variants: None significant. Review of the MIP images confirms the above findings IMPRESSION: 1. No acute intracranial abnormality. 2. No intracranial large vessel occlusion or significant stenosis. 3. No  hemodynamically significant stenosis in the neck. 4. Prominent adenoid tissue obstructing the nasopharynx, similar to prior CT performed in 2016. Mildly prominent lymph nodes in the bilateral anterior and posterior triangles with a 1.3 cm left retropharyngeal lymph node. Electronically Signed   By: Baldemar Lenis M.D.   On: 12/05/2022 13:04   MR BRAIN WO CONTRAST  Result Date: 12/04/2022 CLINICAL DATA:  Initial evaluation for neuro deficit, stroke. EXAM: MRI HEAD WITHOUT CONTRAST TECHNIQUE: Multiplanar, multiecho pulse sequences of the brain and surrounding structures were obtained without intravenous contrast. COMPARISON:  CT from earlier the same day. FINDINGS: Brain: Cerebral volume within normal limits. Few scattered subcentimeter foci of T2/FLAIR hyperintensity noted involving the subcortical white matter of the right greater than left frontal lobes, nonspecific, but overall mild in nature. No evidence for acute or subacute ischemia. Gray-white matter differentiation maintained. No areas of chronic cortical infarction. No acute or chronic intracranial blood products. No mass lesion, midline shift or mass effect. No hydrocephalus or extra-axial fluid collection. Partially empty sella noted. Vascular: Major intracranial vascular flow voids are maintained. Skull and upper cervical spine: Craniocervical junction within normal limits. Diffuse loss of normal bone marrow signal, nonspecific but can be seen with anemia, smoking, obesity, and infiltrative/myelofibrotic marrow processes. No scalp soft tissue abnormality. Sinuses/Orbits: Globes and orbital soft tissues within normal limits. Mild scattered mucosal thickening present about the ethmoidal air cells and maxillary sinuses. Other: Mildly prominent retropharyngeal lymph nodes noted, largest of which measures 1.2 cm on the left (series 10, image 1), indeterminate, but could be reactive. IMPRESSION: 1. No acute intracranial abnormality. 2. Few  scattered subcentimeter foci of T2/FLAIR hyperintensity involving the subcortical white matter of the right greater than left frontal lobes, nonspecific, but overall mild in nature. Primary considerations include changes of chronic microvascular ischemic disease versus complicated migraines. Appearance would not be typical for demyelinating disease. 3. Partially empty sella, nonspecific, but can be seen with idiopathic intracranial hypertension. Electronically Signed   By: Rise Mu M.D.   On: 12/04/2022 22:17   CT HEAD CODE STROKE WO CONTRAST  Result Date: 12/04/2022 CLINICAL DATA:  Code stroke. EXAM: CT HEAD WITHOUT CONTRAST TECHNIQUE: Contiguous axial images were obtained from the base of the skull through the vertex without intravenous contrast. RADIATION DOSE REDUCTION: This exam was performed according to the departmental dose-optimization program which includes automated exposure control, adjustment of the mA and/or kV according to patient size and/or use of iterative reconstruction technique. COMPARISON:  None Available. FINDINGS: Brain: Cerebral volume within normal limits. No acute intracranial hemorrhage. No acute large vessel territory infarct. No mass lesion or midline shift. No hydrocephalus or extra-axial fluid collection. Partially empty sella noted. Vascular: No abnormal hyperdense vessel. Skull: Scalp soft tissues and calvarium within normal limits. Sinuses/Orbits: Globes and orbital soft tissues within normal limits. Scattered mucosal thickening present about the ethmoidal air cells and maxillary sinuses. No mastoid effusion. Other: None. ASPECTS [facility] Stroke Program Early CT Score) - Ganglionic level infarction (caudate, lentiform nuclei, internal capsule, insula, M1-M3  cortex): 7 - Supraganglionic infarction (M4-M6 cortex): 3 Total score (0-10 with 10 being normal): 10 IMPRESSION: 1. No acute intracranial abnormality. 2. ASPECTS is 10. 3. Partially empty sella, nonspecific, but  can be seen with idiopathic intracranial hypertension. These results were communicated to Dr. Derry Lory at 8:27 pm on 12/04/2022 by text page via the [facility] messaging system. Electronically Signed   By: Rise Mu M.D.   On: 12/04/2022 20:28    Microbiology: Results for orders placed or performed during the hospital encounter of 01/30/16  Wet prep, genital     Status: Abnormal   Collection Time: 01/30/16  1:12 AM   Specimen: Thin Prep Cervical/Endocervical; Genital  Result Value Ref Range Status   Yeast Wet Prep HPF POC NONE SEEN NONE SEEN Final   Trich, Wet Prep PRESENT NONE SEEN Final   Clue Cells Wet Prep HPF POC PRESENT NONE SEEN Final   WBC, Wet Prep HPF POC MODERATE (A) NONE SEEN Final   Sperm NONE SEEN  Final    Labs: CBC: Recent Labs  Lab 12/04/22 2014 12/04/22 2015 12/05/22 0349  WBC  --  12.4* 9.2  NEUTROABS  --  6.7  --   HGB 12.2 11.0* 9.8*  HCT 36.0 35.0* 30.9*  MCV  --  75.8* 74.8*  PLT  --  328 269   Basic Metabolic Panel: Recent Labs  Lab 12/04/22 2014 12/04/22 2015 12/05/22 0349  NA 137 135 134*  K 4.1 4.0 3.8  CL 106 103 103  CO2  --  20* 22  GLUCOSE 97 100* 98  BUN 13 13 15   CREATININE 1.00 1.07* 1.06*  CALCIUM  --  9.3 8.9   Liver Function Tests: Recent Labs  Lab 12/04/22 2015  AST 17  ALT 13  ALKPHOS 74  BILITOT 0.6  PROT 8.4*  ALBUMIN 3.5   CBG: No results for input(s): "GLUCAP" in the last 168 hours.  Discharge time spent: greater than 30 minutes.  Signed: Harold Hedge, MD [facility] 12/05/2022

## 2022-12-05 NOTE — Evaluation (Signed)
Physical Therapy Evaluation and Discharge Patient Details Name: Robin Perez MRN: [id number] DOB: [birth year] Today's Date: 12/05/2022  History of Present Illness  Robin Perez is a 37 y.o. female who presented to the ED for evaluation of acute onset left-sided numbness. MRI negative for acute changes. PMHx:  obesity and tobacco use  Clinical Impression   Patient evaluated by Physical Therapy with no further acute PT needs identified. Patient scored 54/56 on Berg Balance Assessment. Feels she is at her baseline. PT is signing off. Thank you for this referral.        Recommendations for follow up therapy are one component of a multi-disciplinary discharge planning process, led by the attending physician.  Recommendations may be updated based on patient status, additional functional criteria and insurance authorization.  Follow Up Recommendations       Assistance Recommended at Discharge None  Patient can return home with the following       Equipment Recommendations None recommended by PT  Recommendations for Other Services       Functional Status Assessment Patient has not had a recent decline in their functional status     Precautions / Restrictions Precautions Precautions: None      Mobility  Bed Mobility Overal bed mobility: Independent                  Transfers Overall transfer level: Independent Equipment used: None               General transfer comment: from EOB and standard toilet    Ambulation/Gait Ambulation/Gait assistance: Independent Gait Distance (Feet): 180 Feet Assistive device: None Gait Pattern/deviations: WFL(Within Functional Limits)   Gait velocity interpretation: >2.62 ft/sec, indicative of community ambulatory      Stairs Stairs: Yes Stairs assistance: Modified independent (Device/Increase time) Stair Management: One rail Right, Forwards, Alternating pattern Number of Stairs: 11 General stair comments: No  issues with stairs; has bed/bathroom upstairs  Wheelchair Mobility    Modified Rankin (Stroke Patients Only) Modified Rankin (Stroke Patients Only) Pre-Morbid Rankin Score: No symptoms Modified Rankin: No symptoms     Balance Overall balance assessment: Independent                               Standardized Balance Assessment Standardized Balance Assessment : Berg Balance Test Berg Balance Test Sit to Stand: Able to stand without using hands and stabilize independently Standing Unsupported: Able to stand safely 2 minutes Sitting with Back Unsupported but Feet Supported on Floor or Stool: Able to sit safely and securely 2 minutes Stand to Sit: Sits safely with minimal use of hands Transfers: Able to transfer safely, minor use of hands Standing Unsupported with Eyes Closed: Able to stand 10 seconds safely Standing Ubsupported with Feet Together: Able to place feet together independently and stand 1 minute safely From Standing, Reach Forward with Outstretched Arm: Can reach confidently >25 cm (10") From Standing Position, Pick up Object from Floor: Able to pick up shoe safely and easily From Standing Position, Turn to Look Behind Over each Shoulder: Looks behind from both sides and weight shifts well Turn 360 Degrees: Able to turn 360 degrees safely in 4 seconds or less Standing Unsupported, Alternately Place Feet on Step/Stool: Able to stand independently and safely and complete 8 steps in 20 seconds Standing Unsupported, One Foot in Front: Able to plae foot ahead of the other independently and hold 30 seconds Standing on One Leg:  Able to lift leg independently and hold 5-10 seconds Total Score: 54         Pertinent Vitals/Pain Pain Assessment Pain Assessment: No/denies pain    Home Living Family/patient expects to be discharged to:: Private residence Living Arrangements: Spouse/significant other Available Help at Discharge: Family;Available  PRN/intermittently Type of Home: House Home Access: Level entry     Alternate Level Stairs-Number of Steps: 10 Home Layout: Two level;Bed/bath upstairs Home Equipment: None      Prior Function Prior Level of Function : Independent/Modified Independent (supposed to start work 4/16--working from home)                     Hand Dominance   Dominant Hand: Left    Extremity/Trunk Assessment   Upper Extremity Assessment Upper Extremity Assessment: Overall WFL for tasks assessed    Lower Extremity Assessment Lower Extremity Assessment: Overall WFL for tasks assessed    Cervical / Trunk Assessment Cervical / Trunk Assessment: Normal  Communication   Communication: No difficulties  Cognition Arousal/Alertness: Awake/alert Behavior During Therapy: WFL for tasks assessed/performed Overall Cognitive Status: Within Functional Limits for tasks assessed                                          General Comments General comments (skin integrity, edema, etc.): Husband present    Exercises     Assessment/Plan    PT Assessment Patient does not need any further PT services  PT Problem List         PT Treatment Interventions      PT Goals (Current goals can be found in the Care Plan section)  Acute Rehab PT Goals Patient Stated Goal: none stated PT Goal Formulation: All assessment and education complete, DC therapy    Frequency       Co-evaluation               AM-PAC PT "6 Clicks" Mobility  Outcome Measure Help needed turning from your back to your side while in a flat bed without using bedrails?: None Help needed moving from lying on your back to sitting on the side of a flat bed without using bedrails?: None Help needed moving to and from a bed to a chair (including a wheelchair)?: None Help needed standing up from a chair using your arms (e.g., wheelchair or bedside chair)?: None Help needed to walk in hospital room?: None Help needed  climbing 3-5 steps with a railing? : None 6 Click Score: 24    End of Session   Activity Tolerance: Patient tolerated treatment well Patient left: in bed;with call bell/phone within reach Nurse Communication: Mobility status;Other (comment) (RN ok with pt up to bathroom on her own with bed alarm off) PT Visit Diagnosis: Other symptoms and signs involving the nervous system (W09.811)    Time: 9147-8295 PT Time Calculation (min) (ACUTE ONLY): 15 min   Charges:   PT Evaluation $PT Eval Low Complexity: 1 Low           Jerolyn Center, PT Acute Rehabilitation Services  Office [phone]   Zena Amos 12/05/2022, 3:33 PM

## 2022-12-05 NOTE — ED Notes (Addendum)
Pt was notified of order for additional MRI imaging. At time of transport pt adamantly refused further MRI scanning. RN explained importance and reasoning for further imaging. Pt declined. MD messaged. Pt remains at baseline neuro status, asymptomatic.

## 2023-01-12 ENCOUNTER — Ambulatory Visit (HOSPITAL_COMMUNITY)
Admission: EM | Admit: 2023-01-12 | Discharge: 2023-01-12 | Disposition: A | Discharge status: Home / Self Care | Payer: Medicaid Other | Attending: Urgent Care | Admitting: Urgent Care

## 2023-01-12 ENCOUNTER — Encounter (HOSPITAL_COMMUNITY): Payer: Self-pay | Admitting: *Deleted

## 2023-01-12 DIAGNOSIS — H6593 Unspecified nonsuppurative otitis media, bilateral: Secondary | ICD-10-CM

## 2023-01-12 DIAGNOSIS — R0981 Nasal congestion: Secondary | ICD-10-CM | POA: Diagnosis not present

## 2023-01-12 DIAGNOSIS — H7291 Unspecified perforation of tympanic membrane, right ear: Secondary | ICD-10-CM | POA: Diagnosis not present

## 2023-01-12 MED ORDER — AMOXICILLIN-POT CLAVULANATE 875-125 MG PO TABS: 1.0000 | ORAL_TABLET | Freq: Two times a day (BID) | ORAL | 0 refills | Status: AC

## 2023-01-12 MED ORDER — CIPROFLOXACIN-DEXAMETHASONE 0.3-0.1 % OT SUSP: 4.0000 [drp] | Freq: Two times a day (BID) | OTIC | 0 refills | Status: DC

## 2023-01-12 NOTE — ED Provider Notes (Signed)
[facility]    CSN: [id number] Arrival date & time: 01/12/23  1031      History   Chief Complaint Chief Complaint  Patient presents with   Ear Drainage   Nasal Congestion    HPI Robin Perez is a 37 y.o. female.   37 year old female presents due to concerns of ear drainage and nasal congestion.  She states for the past 4 days she has had significant nasal congestion.  She does have a known history of asthma and chronic sinusitis.  She denies any sinus pain but states they are very "full".  She reports the congestion is dripping down the back of her throat but she denies a cough.  Her main concern is her ears.  She states they are very full feeling in her right one is draining.  She denies a fever. She has not tried any OTC meds.   Ear Drainage    Past Medical History:  Diagnosis Date   Asthma    Incisional pain 05/09/2013   Will rx cipro   Vertigo     Patient Active Problem List   Diagnosis Date Noted   Left sided numbness 12/04/2022   Lower extremity cellulitis 06/25/2015   Sepsis (HCC) 06/25/2015   Hematuria, undiagnosed cause 06/04/2015   Chronic sinusitis 06/04/2015   Cellulitis 06/04/2015   Microcytic anemia 06/04/2015   Tobacco use 06/04/2015   Cellulitis and abscess of lower extremity 06/04/2015   Incisional pain 05/09/2013   Sterilization consult 03/15/2013   History of cesarean delivery, currently pregnant 11/20/2012   Supervision of other normal pregnancy 11/20/2012   Asthma 10/24/2012    Past Surgical History:  Procedure Laterality Date   CESAREAN SECTION     CESAREAN SECTION     CESAREAN SECTION WITH BILATERAL TUBAL LIGATION N/A 04/23/2013   Procedure: CESAREAN SECTION WITH BILATERAL TUBAL LIGATION;  Surgeon: Willodean Rosenthal, MD;  Location: [facility];  Service: Obstetrics;  Laterality: N/A;    OB History     Gravida  3   Para  3   Term  3   Preterm      AB      Living  3      SAB      IAB      Ectopic       Multiple      Live Births  3            Home Medications    Prior to Admission medications   Medication Sig Start Date End Date Taking? Authorizing Provider  amoxicillin-clavulanate (AUGMENTIN) 875-125 MG tablet Take 1 tablet by mouth every 12 (twelve) hours for 10 days. 01/12/23 01/22/23 Yes Fariha Goto L, PA  ciprofloxacin-dexamethasone (CIPRODEX) OTIC suspension Place 4 drops into the right ear 2 (two) times daily. 01/12/23  Yes Latron Ribas, Jodelle Gross, PA    Family History History reviewed. No pertinent family history.  Social History Social History   Tobacco Use   Smoking status: Former    Packs/day: .25    Types: Cigarettes   Smokeless tobacco: Never  Vaping Use   Vaping Use: Never used  Substance Use Topics   Alcohol use: Yes    Comment: occ   Drug use: No    Types: Benzodiazepines    Comment: denies     Allergies   Patient has no known allergies.   Review of Systems Review of Systems As per HPI  Physical Exam Triage Vital Signs ED Triage Vitals  Enc Vitals  Group     BP 01/12/23 1138 (!) 159/96     Pulse Rate 01/12/23 1138 82     Resp 01/12/23 1138 18     Temp 01/12/23 1138 97.9 F (36.6 C)     Temp Source 01/12/23 1138 Oral     SpO2 01/12/23 1138 97 %     Weight --      Height --      Head Circumference --      Peak Flow --      Pain Score 01/12/23 1136 0     Pain Loc --      Pain Edu? --      Excl. in GC? --    No data found.  Updated Vital Signs BP (!) 144/93 (BP Location: Right Arm)   Pulse 82   Temp 97.9 F (36.6 C) (Oral)   Resp 18   LMP 01/02/2023 (Approximate)   SpO2 97%   Visual Acuity Right Eye Distance:   Left Eye Distance:   Bilateral Distance:    Right Eye Near:   Left Eye Near:    Bilateral Near:     Physical Exam Vitals and nursing note reviewed.  Constitutional:      General: She is not in acute distress.    Appearance: She is well-developed.  HENT:     Head: Normocephalic and atraumatic.     Right Ear: A  middle ear effusion is present. Tympanic membrane is injected and perforated.     Left Ear: A middle ear effusion is present. Tympanic membrane is not injected or perforated.     Nose: Nasal deformity, septal deviation, congestion and rhinorrhea present.     Right Sinus: No maxillary sinus tenderness or frontal sinus tenderness.     Left Sinus: No maxillary sinus tenderness or frontal sinus tenderness.     Mouth/Throat:     Lips: Pink.     Pharynx: Oropharynx is clear. Uvula midline. No pharyngeal swelling, oropharyngeal exudate, posterior oropharyngeal erythema or uvula swelling.     Comments: Post nasal drainage Eyes:     Conjunctiva/sclera: Conjunctivae normal.  Cardiovascular:     Rate and Rhythm: Normal rate and regular rhythm.     Heart sounds: No murmur heard. Pulmonary:     Effort: Pulmonary effort is normal. No respiratory distress.     Breath sounds: Normal breath sounds.  Abdominal:     Palpations: Abdomen is soft.     Tenderness: There is no abdominal tenderness.  Musculoskeletal:        General: No swelling.     Cervical back: Neck supple.  Skin:    General: Skin is warm and dry.     Capillary Refill: Capillary refill takes less than 2 seconds.  Neurological:     Mental Status: She is alert.  Psychiatric:        Mood and Affect: Mood normal.      UC Treatments / Results  Labs (all labs ordered are listed, but only abnormal results are displayed) Labs Reviewed - No data to display  EKG   Radiology No results found.  Procedures Procedures (including critical care time)  Medications Ordered in UC Medications - No data to display  Initial Impression / Assessment and Plan / UC Course  I have reviewed the triage vital signs and the nursing notes.  Pertinent labs & imaging results that were available during my care of the patient were reviewed by me and considered in my medical decision making (see  chart for details).     B OM with effusion - start abx  x 10 days Perforated TM R - ciprodex BID x 7 days. Nasal sinus congestion -choose Augmentin for the antibiotic given patient's history of chronic sinusitis in combination with recurrent bilateral otitis media with effusion.  Recommended patient follow-up with PCP in 14 days for recheck.   Final Clinical Impressions(s) / UC Diagnoses   Final diagnoses:  Bilateral otitis media with effusion  Perforated eardrum, right  Nasal sinus congestion     Discharge Instructions      You have an infection in your ears, but a small hole in your right eardrum which is causing the discharge.  Please take the antibiotic twice daily with food for the next 10 days. Please put 4 drops of the Ciprodex in your right ear only twice daily for the next 7 days. Leave your right ear tilted to the ceiling for 10 minutes after administration of the drops.  Please establish care with a PCP, and schedule follow-up within the next 14 days for recheck.     ED Prescriptions     Medication Sig Dispense Auth. Provider   ciprofloxacin-dexamethasone (CIPRODEX) OTIC suspension Place 4 drops into the right ear 2 (two) times daily. 7.5 mL Makhia Vosler L, PA   amoxicillin-clavulanate (AUGMENTIN) 875-125 MG tablet Take 1 tablet by mouth every 12 (twelve) hours for 10 days. 20 tablet Kailash Hinze L, [place]      PDMP not reviewed this encounter.   Maretta Bees, [place] 01/12/23 1208

## 2023-01-12 NOTE — ED Triage Notes (Signed)
Pt states she has right ear drainage and congestion x 4 days. She hasn't taken any meds for sx.

## 2023-01-12 NOTE — ED Notes (Signed)
PCP appt made for 01/31/2023 9:40am. Pt aware and given printout.

## 2023-01-12 NOTE — Discharge Instructions (Signed)
You have an infection in your ears, but a small hole in your right eardrum which is causing the discharge.  Please take the antibiotic twice daily with food for the next 10 days. Please put 4 drops of the Ciprodex in your right ear only twice daily for the next 7 days. Leave your right ear tilted to the ceiling for 10 minutes after administration of the drops.  Please establish care with a PCP, and schedule follow-up within the next 14 days for recheck.

## 2023-01-29 ENCOUNTER — Ambulatory Visit (HOSPITAL_COMMUNITY)
Admission: EM | Admit: 2023-01-29 | Discharge: 2023-01-29 | Disposition: A | Discharge status: Home / Self Care | Payer: Medicaid Other | Attending: Emergency Medicine | Admitting: Emergency Medicine

## 2023-01-29 ENCOUNTER — Encounter (HOSPITAL_COMMUNITY): Payer: Self-pay | Admitting: Emergency Medicine

## 2023-01-29 DIAGNOSIS — J019 Acute sinusitis, unspecified: Secondary | ICD-10-CM | POA: Diagnosis not present

## 2023-01-29 DIAGNOSIS — B9689 Other specified bacterial agents as the cause of diseases classified elsewhere: Secondary | ICD-10-CM

## 2023-01-29 MED ORDER — DOXYCYCLINE HYCLATE 100 MG PO CAPS: 100.0000 mg | ORAL_CAPSULE | Freq: Two times a day (BID) | ORAL | 0 refills | Status: DC

## 2023-01-29 MED ORDER — FLUCONAZOLE 150 MG PO TABS: ORAL_TABLET | ORAL | 0 refills | Status: DC

## 2023-01-29 MED ORDER — FLUTICASONE PROPIONATE 50 MCG/ACT NA SUSP: 1.0000 | Freq: Every day | NASAL | 2 refills | Status: DC

## 2023-01-29 NOTE — ED Triage Notes (Signed)
Pt reports nasal congestion and sinus pressure x 1 week. States she has been having reoccurring sinus infections all her life.   Also reports nausea x 3 days.

## 2023-01-29 NOTE — Discharge Instructions (Addendum)
You have a bacterial sinus infection, since you are recently placed on Augmentin, I am covering you with doxycycline.  Please take this medication with food as it can cause gastrointestinal upset.  Antibiotics to make you more prone to yeast infections, you can take the Diflucan if vaginal itching develops.  I suggest increasing your intake of probiotics such as [place] yogurt, or an over-the-counter probiotic for women's health with lactobacillus.  I also suggest doing saline rinses with filtered water and using the Flonase nasal spray daily.  We are can set you up with a primary care provider, who can then refer you out to an ear nose and throat for further evaluation of your recurrent sinus infections.  Please return to clinic for any new or concerning symptoms.

## 2023-01-29 NOTE — ED Provider Notes (Signed)
[facility]    CSN: [id number] Arrival date & time: 01/29/23  1023      History   Chief Complaint Chief Complaint  Patient presents with   Sinus Pressure    Nausea   Nasal Congestion    HPI Robin Perez is a 37 y.o. female.   Patient presents to clinic for nasal congestion, sinus pressure and pain for the past week.  She was recently seen and diagnosed with otitis media, placed on Augmentin on 5/23.  She reports a history of recurring sinus infections.  She denies any fevers.  Reports postnasal drip and swallowing her secretions, causing nausea.     The history is provided by the patient and medical records.    Past Medical History:  Diagnosis Date   Asthma    Incisional pain 05/09/2013   Will rx cipro   Vertigo     Patient Active Problem List   Diagnosis Date Noted   Left sided numbness 12/04/2022   Lower extremity cellulitis 06/25/2015   Sepsis (HCC) 06/25/2015   Hematuria, undiagnosed cause 06/04/2015   Chronic sinusitis 06/04/2015   Cellulitis 06/04/2015   Microcytic anemia 06/04/2015   Tobacco use 06/04/2015   Cellulitis and abscess of lower extremity 06/04/2015   Incisional pain 05/09/2013   Sterilization consult 03/15/2013   History of cesarean delivery, currently pregnant 11/20/2012   Supervision of other normal pregnancy 11/20/2012   Asthma 10/24/2012    Past Surgical History:  Procedure Laterality Date   CESAREAN SECTION     CESAREAN SECTION     CESAREAN SECTION WITH BILATERAL TUBAL LIGATION N/A 04/23/2013   Procedure: CESAREAN SECTION WITH BILATERAL TUBAL LIGATION;  Surgeon: Willodean Rosenthal, MD;  Location: [facility];  Service: Obstetrics;  Laterality: N/A;    OB History     Gravida  3   Para  3   Term  3   Preterm      AB      Living  3      SAB      IAB      Ectopic      Multiple      Live Births  3            Home Medications    Prior to Admission medications   Medication Sig Start Date End  Date Taking? Authorizing Provider  doxycycline (VIBRAMYCIN) 100 MG capsule Take 1 capsule (100 mg total) by mouth 2 (two) times daily. 01/29/23  Yes Rinaldo Ratel, [place] N, FNP  fluconazole (DIFLUCAN) 150 MG tablet Use 1 tablet if vaginal itching develops, and then another in 72 hours if vaginal itching persist, you can also take 1 at the end of antibiotic use. 01/29/23  Yes Rinaldo Ratel, [place] N, FNP  fluticasone [facility]) 50 MCG/ACT nasal spray Place 1 spray into both nostrils daily. 01/29/23  Yes Rinaldo Ratel, [place] N, FNP  ciprofloxacin-dexamethasone (CIPRODEX) OTIC suspension Place 4 drops into the right ear 2 (two) times daily. 01/12/23   Maretta Bees, PA    Family History History reviewed. No pertinent family history.  Social History Social History   Tobacco Use   Smoking status: Former    Packs/day: .25    Types: Cigarettes   Smokeless tobacco: Never  Vaping Use   Vaping Use: Never used  Substance Use Topics   Alcohol use: Yes    Comment: occ   Drug use: No    Types: Benzodiazepines    Comment: denies     Allergies  Patient has no known allergies.   Review of Systems Review of Systems  Constitutional:  Negative for chills and fever.  HENT:  Positive for congestion, postnasal drip, rhinorrhea, sinus pressure and sinus pain. Negative for sore throat.   Respiratory:  Negative for cough.   Cardiovascular:  Negative for chest pain.     Physical Exam Triage Vital Signs ED Triage Vitals  Enc Vitals Group     BP 01/29/23 1116 (!) 129/91     Pulse Rate 01/29/23 1116 89     Resp 01/29/23 1116 17     Temp 01/29/23 1116 98 F (36.7 C)     Temp Source 01/29/23 1116 Oral     SpO2 01/29/23 1116 95 %     Weight --      Height --      Head Circumference --      Peak Flow --      Pain Score 01/29/23 1114 7     Pain Loc --      Pain Edu? --      Excl. in GC? --    No data found.  Updated Vital Signs BP (!) 129/91 (BP Location: Right Arm)   Pulse 89   Temp 98 F (36.7  C) (Oral)   Resp 17   LMP 01/02/2023 (Approximate)   SpO2 95%   Visual Acuity Right Eye Distance:   Left Eye Distance:   Bilateral Distance:    Right Eye Near:   Left Eye Near:    Bilateral Near:     Physical Exam Vitals and nursing note reviewed.  Constitutional:      Appearance: Normal appearance.  HENT:     Head: Normocephalic and atraumatic.     Right Ear: Tympanic membrane, ear canal and external ear normal.     Left Ear: Tympanic membrane, ear canal and external ear normal.     Nose: Congestion and rhinorrhea present.     Right Turbinates: Enlarged and swollen.     Left Turbinates: Enlarged and swollen.     Right Sinus: Frontal sinus tenderness present.     Left Sinus: Frontal sinus tenderness present.     Mouth/Throat:     Mouth: Mucous membranes are moist.  Eyes:     Conjunctiva/sclera: Conjunctivae normal.  Cardiovascular:     Rate and Rhythm: Normal rate and regular rhythm.     Heart sounds: Normal heart sounds. No murmur heard. Pulmonary:     Effort: Pulmonary effort is normal. No respiratory distress.     Breath sounds: Normal breath sounds.  Musculoskeletal:        General: No swelling. Normal range of motion.  Skin:    General: Skin is warm and dry.  Neurological:     General: No focal deficit present.     Mental Status: She is alert and oriented to person, place, and time.  Psychiatric:        Mood and Affect: Mood normal.        Behavior: Behavior normal.      UC Treatments / Results  Labs (all labs ordered are listed, but only abnormal results are displayed) Labs Reviewed - No data to display  EKG   Radiology No results found.  Procedures Procedures (including critical care time)  Medications Ordered in UC Medications - No data to display  Initial Impression / Assessment and Plan / UC Course  I have reviewed the triage vital signs and the nursing notes.  Pertinent labs & imaging  results that were available during my care of the  patient were reviewed by me and considered in my medical decision making (see chart for details).  Vitals and triage reviewed, patient is hemodynamically stable.  Sinus tenderness on exam with postnasal drip and congestion, symptoms present for over a week.  Recently treated with Augmentin, will cover for ABS with doxycycline.  History of yeast vaginitis with antibiotics use, will cover with Diflucan.  Symptomatic management discussed, staff to set up with a PCP for further evaluation, who can refer to ENT if warranted.  Return and follow-up precautions given, no questions at this time.     Final Clinical Impressions(s) / UC Diagnoses   Final diagnoses:  Acute bacterial sinusitis     Discharge Instructions      You have a bacterial sinus infection, since you are recently placed on Augmentin, I am covering you with doxycycline.  Please take this medication with food as it can cause gastrointestinal upset.  Antibiotics to make you more prone to yeast infections, you can take the Diflucan if vaginal itching develops.  I suggest increasing your intake of probiotics such as [place] yogurt, or an over-the-counter probiotic for women's health with lactobacillus.  I also suggest doing saline rinses with filtered water and using the Flonase nasal spray daily.  We are can set you up with a primary care provider, who can then refer you out to an ear nose and throat for further evaluation of your recurrent sinus infections.  Please return to clinic for any new or concerning symptoms.      ED Prescriptions     Medication Sig Dispense Auth. Provider   doxycycline (VIBRAMYCIN) 100 MG capsule Take 1 capsule (100 mg total) by mouth 2 (two) times daily. 20 capsule Rinaldo Ratel, [place] N, [place]   fluconazole (DIFLUCAN) 150 MG tablet Use 1 tablet if vaginal itching develops, and then another in 72 hours if vaginal itching persist, you can also take 1 at the end of antibiotic use. 3 tablet Rinaldo Ratel, [place] N, FNP    fluticasone [facility]) 50 MCG/ACT nasal spray Place 1 spray into both nostrils daily. 9.9 mL Ason Heslin, [place] N, FNP      PDMP not reviewed this encounter.   Lurine Imel, [place] N, [place] 01/29/23 1157

## 2023-01-31 ENCOUNTER — Ambulatory Visit: Payer: Medicaid Other | Admitting: Family

## 2023-02-06 ENCOUNTER — Encounter (HOSPITAL_COMMUNITY): Payer: Self-pay

## 2023-02-06 ENCOUNTER — Ambulatory Visit (HOSPITAL_COMMUNITY)
Admission: EM | Admit: 2023-02-06 | Discharge: 2023-02-06 | Disposition: A | Discharge status: Home / Self Care | Payer: Medicaid Other

## 2023-02-06 ENCOUNTER — Other Ambulatory Visit: Payer: Self-pay

## 2023-02-06 ENCOUNTER — Emergency Department (HOSPITAL_COMMUNITY)
Admission: EM | Admit: 2023-02-06 | Discharge: 2023-02-06 | Discharge status: Against Medical Advice | Payer: Medicaid Other | Attending: Emergency Medicine | Admitting: Emergency Medicine

## 2023-02-06 DIAGNOSIS — Z5321 Procedure and treatment not carried out due to patient leaving prior to being seen by health care provider: Secondary | ICD-10-CM | POA: Diagnosis not present

## 2023-02-06 DIAGNOSIS — Z8673 Personal history of transient ischemic attack (TIA), and cerebral infarction without residual deficits: Secondary | ICD-10-CM | POA: Insufficient documentation

## 2023-02-06 DIAGNOSIS — R202 Paresthesia of skin: Secondary | ICD-10-CM

## 2023-02-06 LAB — RAPID URINE DRUG SCREEN, HOSP PERFORMED
Amphetamines: NOT DETECTED
Barbiturates: NOT DETECTED
Benzodiazepines: NOT DETECTED
Cocaine: NOT DETECTED
Opiates: NOT DETECTED
Tetrahydrocannabinol: NOT DETECTED

## 2023-02-06 LAB — COMPREHENSIVE METABOLIC PANEL
ALT: 11 U/L (ref 0–44)
AST: 17 U/L (ref 15–41)
Albumin: 3.1 g/dL — ABNORMAL LOW (ref 3.5–5.0)
Alkaline Phosphatase: 74 U/L (ref 38–126)
Anion gap: 11 (ref 5–15)
BUN: 16 mg/dL (ref 6–20)
CO2: 23 mmol/L (ref 22–32)
Calcium: 9.4 mg/dL (ref 8.9–10.3)
Chloride: 104 mmol/L (ref 98–111)
Creatinine, Ser: 0.96 mg/dL (ref 0.44–1.00)
GFR, Estimated: >60 mL/min (ref >60)
Glucose, Bld: 83 mg/dL (ref 70–99)
Potassium: 3.7 mmol/L (ref 3.5–5.1)
Sodium: 138 mmol/L (ref 135–145)
Total Bilirubin: 0.3 mg/dL (ref 0.3–1.2)
Total Protein: 7.5 g/dL (ref 6.5–8.1)

## 2023-02-06 LAB — CBC
HCT: 32.1 % — ABNORMAL LOW (ref 36.0–46.0)
Hemoglobin: 9.8 g/dL — ABNORMAL LOW (ref 12.0–15.0)
MCH: 23.4 pg — ABNORMAL LOW (ref 26.0–34.0)
MCHC: 30.5 g/dL (ref 30.0–36.0)
MCV: 76.6 fL — ABNORMAL LOW (ref 80.0–100.0)
Platelets: 345 10*3/uL (ref 150–400)
RBC: 4.19 MIL/uL (ref 3.87–5.11)
RDW: 17.1 % — ABNORMAL HIGH (ref 11.5–15.5)
WBC: 10.4 10*3/uL (ref 4.0–10.5)
nRBC: 0 % (ref 0.0–0.2)

## 2023-02-06 MED ORDER — ASPIRIN 325 MG PO TABS: 325.0000 mg | ORAL_TABLET | Freq: Once | ORAL | Status: DC

## 2023-02-06 NOTE — ED Provider Triage Note (Signed)
Emergency Medicine Provider Triage Evaluation Note  Robin Perez , a 37 y.o. female  was evaluated in triage.  Pt complains of TIA sxs. Report while working today, she felt tingling sensation to hands and feet.  Was concerns that it may be due to high blood pressure.  Checked her BP and noted that it was 180 systolic.  Then pt report feeling pressure in her head.  Went to [facility] and sent here.  However sxs lasted for less than 1 hr and has since resolved.  No fever, diplopia, speech changes, focal weakness.  Hx of alcohol and tobacco use.  Denies increasing stress or changes in medication.  LMP 10 days ago  Review of Systems  Positive: As above Negative: As above  Physical Exam  BP (!) 152/100 (BP Location: Left Wrist)   Pulse 77   Temp 98.2 F (36.8 C) (Oral)   Resp 16   LMP 01/23/2023 (Approximate)   SpO2 100%  Gen:   Awake, no distress   Resp:  Normal effort  MSK:   Moves extremities without difficulty  Other:  No focal neuro deficit  Medical Decision Making  Medically screening exam initiated at 1:33 PM.  Appropriate orders placed.  [name] was informed that the remainder of the evaluation will be completed by another provider, this initial triage assessment does not replace that evaluation, and the importance of remaining in the ED until their evaluation is complete.     Fayrene Helper, PA-C 02/06/23 1335

## 2023-02-06 NOTE — ED Triage Notes (Signed)
Pt c/o lt arm tingling, weakness, and chilling sensation x30 mins. Hx of TIA. States is no on any meds. No drift or facial droop[ing noted. NAD. EKG preformed.

## 2023-02-06 NOTE — ED Triage Notes (Signed)
Pt came in via POV d/t beginning to feel light headed & a chilling sensation in her Lt arm around 1130. Pt reports her home BP was 181/112 so she went to [facility] & they sent here. A/Ox4, denies pain while in triage.

## 2023-02-06 NOTE — ED Provider Notes (Signed)
[facility]    CSN: [id number] Arrival date & time: 02/06/23  1208    HISTORY   Chief Complaint  Patient presents with   Extremity Weakness   HPI Robin Perez is a pleasant, 37 y.o. female who presents to urgent care today. Patient complains of left arm tingling, feeling weak and chilling sensation for the past 30 minutes.  Patient reports a history of TIA, was not advised to take medications daily for this.  Patient states she took her blood pressure at home and it was 180/100.  Patient has a blood pressure of 141/1 100 at this time.     Extremity Weakness   Past Medical History:  Diagnosis Date   Asthma    Incisional pain 05/09/2013   Will rx cipro   Vertigo    Patient Active Problem List   Diagnosis Date Noted   Left sided numbness 12/04/2022   Lower extremity cellulitis 06/25/2015   Sepsis (HCC) 06/25/2015   Hematuria, undiagnosed cause 06/04/2015   Chronic sinusitis 06/04/2015   Cellulitis 06/04/2015   Microcytic anemia 06/04/2015   Tobacco use 06/04/2015   Cellulitis and abscess of lower extremity 06/04/2015   Incisional pain 05/09/2013   Sterilization consult 03/15/2013   History of cesarean delivery, currently pregnant 11/20/2012   Supervision of other normal pregnancy 11/20/2012   Asthma 10/24/2012   Past Surgical History:  Procedure Laterality Date   CESAREAN SECTION     CESAREAN SECTION     CESAREAN SECTION WITH BILATERAL TUBAL LIGATION N/A 04/23/2013   Procedure: CESAREAN SECTION WITH BILATERAL TUBAL LIGATION;  Surgeon: Willodean Rosenthal, MD;  Location: [facility];  Service: Obstetrics;  Laterality: N/A;   OB History     Gravida  3   Para  3   Term  3   Preterm      AB      Living  3      SAB      IAB      Ectopic      Multiple      Live Births  3          Home Medications    Prior to Admission medications   Not on File    Family History History reviewed. No pertinent family history. Social  History Social History   Tobacco Use   Smoking status: Former    Packs/day: .25    Types: Cigarettes   Smokeless tobacco: Never  Vaping Use   Vaping Use: Never used  Substance Use Topics   Alcohol use: Yes    Comment: occ   Drug use: No    Types: Benzodiazepines    Comment: denies   Allergies   Patient has no known allergies.  Review of Systems Review of Systems  Musculoskeletal:  Positive for extremity weakness.   Pertinent findings revealed after performing a 14 point review of systems has been noted in the history of present illness.  Vital Signs BP (!) 141/100 (BP Location: Left Arm)   Pulse 81   Temp 97.9 F (36.6 C) (Oral)   Resp 18   LMP 01/23/2023 (Approximate)   SpO2 100%   No data found.  Physical Exam Constitutional:      General: She is awake. She is in acute distress.     Appearance: Normal appearance. She is well-developed and well-groomed. She is morbidly obese. She is not ill-appearing, toxic-appearing or diaphoretic.  Neurological:     General: No focal deficit present.  Mental Status: She is alert and oriented to person, place, and time.     GCS: GCS eye subscore is 4. GCS verbal subscore is 5. GCS motor subscore is 6.     Cranial Nerves: Cranial nerves 2-12 are intact.     Sensory: Sensation is intact.     Motor: Motor function is intact.     Coordination: Coordination is intact.     Gait: Gait is intact.     Deep Tendon Reflexes: Reflexes are normal and symmetric.  Psychiatric:        Attention and Perception: Attention and perception normal.        Mood and Affect: Affect normal. Mood is anxious.        Speech: Speech is rapid and pressured.        Behavior: Behavior normal. Behavior is cooperative.        Thought Content: Thought content normal.        Cognition and Memory: Cognition and memory normal.        Judgment: Judgment normal.     UC Couse / Diagnostics / Procedures:     Radiology No results  found.  Procedures Procedures (including critical care time) EKG  Pending results:  Labs Reviewed - No data to display  Medications Ordered in UC: Medications - No data to display  UC Diagnoses / Final Clinical Impressions(s)   I have reviewed the triage vital signs and the nursing notes.  Pertinent labs & imaging results that were available during my care of the patient were reviewed by me and considered in my medical decision making (see chart for details).    Final diagnoses:  History of transient ischemic attack (TIA)  Paresthesia  Patient redirected to the ED for further evaluation.   ED Prescriptions   None    PDMP not reviewed this encounter.  Disposition Upon Discharge:  Condition: stable for discharge Emergency Room via private vehicle operated by family member.  Patient and family member verbally agree to go now.    Patient presented with an acute illness with associated systemic symptoms and significant discomfort requiring urgent management. In my opinion, this is a condition that a prudent lay person (someone who possesses an average knowledge of health and medicine) may potentially expect to result in complications if not addressed urgently such as respiratory distress, impairment of bodily function or dysfunction of bodily organs.   As such, the patient has been evaluated and assessed, work-up was performed and treatment was provided in alignment with urgent care protocols and evidence based medicine.  The patient will follow up with their current PCP if and as advised. If the patient does not currently have a PCP we will assist them in obtaining one.   The patient may need specialty follow up if the symptoms continue, in spite of conservative treatment and management, for further workup, evaluation, consultation and treatment as clinically indicated and appropriate.  Patient/parent/caregiver verbalized understanding and agreement of plan as discussed.  All  questions were addressed during visit.  Please see discharge instructions below for further details of plan.  This office note has been dictated using Teaching laboratory technician.  Unfortunately, this method of dictation can sometimes lead to typographical or grammatical errors.  I apologize for your inconvenience in advance if this occurs.  Please do not hesitate to reach out to me if clarification is needed.      Theadora Rama Scales, PA-C 02/06/23 1231

## 2023-02-06 NOTE — ED Notes (Signed)
Pt. Called for to be taken back to room w/no response. Moved to [facility]

## 2023-02-06 NOTE — ED Notes (Signed)
Patient is being discharged from the Urgent Care and sent to the Emergency Department via POC . Per Lillia Abed, [place], patient is in need of higher level of care due to arm numbness with hx of TIA. Patient is aware and verbalizes understanding of plan of care.  Vitals:   02/06/23 1215  BP: (!) 141/100  Pulse: 81  Resp: 18  Temp: 97.9 F (36.6 C)  SpO2: 100%

## 2023-02-09 ENCOUNTER — Ambulatory Visit: Payer: Medicaid Other | Admitting: Family Medicine

## 2023-04-01 ENCOUNTER — Ambulatory Visit (HOSPITAL_COMMUNITY): Payer: Medicaid Other

## 2023-04-01 ENCOUNTER — Ambulatory Visit (HOSPITAL_COMMUNITY)
Admission: EM | Admit: 2023-04-01 | Discharge: 2023-04-01 | Disposition: A | Discharge status: Home / Self Care | Payer: Medicaid Other | Attending: Physician Assistant | Admitting: Physician Assistant

## 2023-04-01 ENCOUNTER — Encounter (HOSPITAL_COMMUNITY): Payer: Self-pay

## 2023-04-01 DIAGNOSIS — M79672 Pain in left foot: Secondary | ICD-10-CM | POA: Diagnosis not present

## 2023-04-01 MED ORDER — NAPROXEN 500 MG PO TABS: 500.0000 mg | ORAL_TABLET | Freq: Two times a day (BID) | ORAL | 0 refills | Status: DC

## 2023-04-01 MED ORDER — KETOROLAC TROMETHAMINE 30 MG/ML IJ SOLN
INTRAMUSCULAR | Status: AC
  Filled 2023-04-01: qty 1

## 2023-04-01 MED ORDER — KETOROLAC TROMETHAMINE 30 MG/ML IJ SOLN: 30.0000 mg | Freq: Once | INTRAMUSCULAR | Status: DC

## 2023-04-01 NOTE — ED Triage Notes (Signed)
Patient here today with c/o left foot pain and swelling X 3 weeks. Pain has been worsening. No known injury. Has tried resting and elevating but not helping. IBU and Tylenol provide no relief. Increased pain with weightbearing. Patient has been trying to be more active and has been trying to walk more.

## 2023-04-01 NOTE — ED Provider Notes (Signed)
[facility]    CSN: [id number] Arrival date & time: 04/01/23  1624      History   Chief Complaint Chief Complaint  Patient presents with   Foot Pain    HPI Robin Perez is a 37 y.o. female.   Patient complains of left foot pain that started several weeks ago.  She denies injury or trauma but does report she has been walking more recently on the track.  She reports swelling to the top of her left foot.  She has tried resting and elevation with minimal relief.  She has tried ibuprofen and Tylenol with minimal relief.  Pain is worse with weightbearing.  No problems with this foot in the past.    Past Medical History:  Diagnosis Date   Asthma    Incisional pain 05/09/2013   Will rx cipro   Vertigo     Patient Active Problem List   Diagnosis Date Noted   Left sided numbness 12/04/2022   Lower extremity cellulitis 06/25/2015   Sepsis (HCC) 06/25/2015   Hematuria, undiagnosed cause 06/04/2015   Chronic sinusitis 06/04/2015   Cellulitis 06/04/2015   Microcytic anemia 06/04/2015   Tobacco use 06/04/2015   Cellulitis and abscess of lower extremity 06/04/2015   Incisional pain 05/09/2013   Sterilization consult 03/15/2013   History of cesarean delivery, currently pregnant 11/20/2012   Supervision of other normal pregnancy 11/20/2012   Asthma 10/24/2012    Past Surgical History:  Procedure Laterality Date   CESAREAN SECTION     CESAREAN SECTION     CESAREAN SECTION WITH BILATERAL TUBAL LIGATION N/A 04/23/2013   Procedure: CESAREAN SECTION WITH BILATERAL TUBAL LIGATION;  Surgeon: Willodean Rosenthal, MD;  Location: [facility];  Service: Obstetrics;  Laterality: N/A;    OB History     Gravida  3   Para  3   Term  3   Preterm      AB      Living  3      SAB      IAB      Ectopic      Multiple      Live Births  3            Home Medications    Prior to Admission medications   Medication Sig Start Date End Date Taking? Authorizing  Provider  naproxen (NAPROSYN) 500 MG tablet Take 1 tablet (500 mg total) by mouth 2 (two) times daily. 04/01/23  Yes Ward, Tylene Fantasia, PA-C    Family History History reviewed. No pertinent family history.  Social History Social History   Tobacco Use   Smoking status: Former    Current packs/day: 0.25    Types: Cigarettes   Smokeless tobacco: Never  Vaping Use   Vaping status: Never Used  Substance Use Topics   Alcohol use: Yes    Comment: occ   Drug use: No    Types: Benzodiazepines    Comment: denies     Allergies   Patient has no known allergies.   Review of Systems Review of Systems  Constitutional:  Negative for chills and fever.  HENT:  Negative for ear pain and sore throat.   Eyes:  Negative for pain and visual disturbance.  Respiratory:  Negative for cough and shortness of breath.   Cardiovascular:  Negative for chest pain and palpitations.  Gastrointestinal:  Negative for abdominal pain and vomiting.  Genitourinary:  Negative for dysuria and hematuria.  Musculoskeletal:  Positive for arthralgias.  Negative for back pain.  Skin:  Negative for color change and rash.  Neurological:  Negative for seizures and syncope.  All other systems reviewed and are negative.    Physical Exam Triage Vital Signs ED Triage Vitals  Encounter Vitals Group     BP 04/01/23 1746 137/89     Systolic BP Percentile --      Diastolic BP Percentile --      Pulse Rate 04/01/23 1746 74     Resp 04/01/23 1746 18     Temp 04/01/23 1746 98.4 F (36.9 C)     Temp Source 04/01/23 1746 Oral     SpO2 04/01/23 1746 94 %     Weight 04/01/23 1745 227 lb (103 kg)     Height 04/01/23 1745 5\' 7"  (1.702 m)     Head Circumference --      Peak Flow --      Pain Score 04/01/23 1745 10     Pain Loc --      Pain Education --      Exclude from Growth Chart --    No data found.  Updated Vital Signs BP 137/89 (BP Location: Left Arm)   Pulse 74   Temp 98.4 F (36.9 C) (Oral)   Resp 18   Ht  5\' 7"  (1.702 m)   Wt 227 lb (103 kg)   LMP 03/25/2023 (Approximate)   SpO2 94%   BMI 35.55 kg/m   Visual Acuity Right Eye Distance:   Left Eye Distance:   Bilateral Distance:    Right Eye Near:   Left Eye Near:    Bilateral Near:     Physical Exam Vitals and nursing note reviewed.  Constitutional:      General: She is not in acute distress.    Appearance: She is well-developed.  HENT:     Head: Normocephalic and atraumatic.  Eyes:     Conjunctiva/sclera: Conjunctivae normal.  Cardiovascular:     Rate and Rhythm: Normal rate and regular rhythm.     Heart sounds: No murmur heard. Pulmonary:     Effort: Pulmonary effort is normal. No respiratory distress.     Breath sounds: Normal breath sounds.  Abdominal:     Palpations: Abdomen is soft.     Tenderness: There is no abdominal tenderness.  Musculoskeletal:        General: No swelling.     Cervical back: Neck supple.  Feet:     Comments: Tenderness to palpation over second third fourth metatarsals with mild swelling.  No bruising noted. Skin:    General: Skin is warm and dry.     Capillary Refill: Capillary refill takes less than 2 seconds.  Neurological:     Mental Status: She is alert.  Psychiatric:        Mood and Affect: Mood normal.      UC Treatments / Results  Labs (all labs ordered are listed, but only abnormal results are displayed) Labs Reviewed - No data to display  EKG   Radiology DG Foot Complete Left  Result Date: 04/01/2023 CLINICAL DATA:  Left foot swelling. EXAM: LEFT FOOT - COMPLETE 3+ VIEW COMPARISON:  None Available. FINDINGS: There is no evidence of fracture or dislocation. There is no evidence of arthropathy or other focal bone abnormality. Soft tissues are unremarkable. IMPRESSION: Negative. Electronically Signed   By: Elgie Collard M.D.   On: 04/01/2023 18:21    Procedures Procedures (including critical care time)  Medications Ordered in UC  Medications - No data to  display  Initial Impression / Assessment and Plan / UC Course  I have reviewed the triage vital signs and the nursing notes.  Pertinent labs & imaging results that were available during my care of the patient were reviewed by me and considered in my medical decision making (see chart for details).     Foot pain.  Postop shoe given today.  Patient declined Toradol in clinic.  Will prescribe Naprosyn.  Advised ice and elevation along with rest. Final Clinical Impressions(s) / UC Diagnoses   Final diagnoses:  Foot pain, left     Discharge Instructions      Recommend ice, elevation, rest Can take naprosyn as needed     ED Prescriptions     Medication Sig Dispense Auth. Provider   naproxen (NAPROSYN) 500 MG tablet Take 1 tablet (500 mg total) by mouth 2 (two) times daily. 30 tablet Ward, Tylene Fantasia, PA-C      PDMP not reviewed this encounter.   Ward, Tylene Fantasia, PA-C 04/01/23 [phone]

## 2023-04-01 NOTE — Discharge Instructions (Addendum)
Recommend ice, elevation, rest Can take naprosyn as needed

## 2023-04-12 ENCOUNTER — Other Ambulatory Visit: Payer: Self-pay

## 2023-04-12 ENCOUNTER — Emergency Department (HOSPITAL_COMMUNITY)
Admission: EM | Admit: 2023-04-12 | Discharge: 2023-04-13 | Disposition: A | Discharge status: Home / Self Care | Payer: Medicaid Other | Attending: Emergency Medicine | Admitting: Emergency Medicine

## 2023-04-12 DIAGNOSIS — R0602 Shortness of breath: Secondary | ICD-10-CM | POA: Insufficient documentation

## 2023-04-12 DIAGNOSIS — R7989 Other specified abnormal findings of blood chemistry: Secondary | ICD-10-CM | POA: Diagnosis not present

## 2023-04-12 DIAGNOSIS — M7989 Other specified soft tissue disorders: Secondary | ICD-10-CM | POA: Diagnosis present

## 2023-04-12 DIAGNOSIS — R0789 Other chest pain: Secondary | ICD-10-CM | POA: Insufficient documentation

## 2023-04-12 DIAGNOSIS — R600 Localized edema: Secondary | ICD-10-CM | POA: Insufficient documentation

## 2023-04-12 DIAGNOSIS — I10 Essential (primary) hypertension: Secondary | ICD-10-CM | POA: Insufficient documentation

## 2023-04-12 DIAGNOSIS — R0981 Nasal congestion: Secondary | ICD-10-CM | POA: Diagnosis not present

## 2023-04-12 NOTE — ED Triage Notes (Signed)
Patient reports SOB onset yesterday , denies cough or congestion , she adds persistent left foot swelling for several weeks , denies injury /ambulatory .

## 2023-04-13 ENCOUNTER — Emergency Department (HOSPITAL_COMMUNITY): Payer: Medicaid Other

## 2023-04-13 ENCOUNTER — Emergency Department (HOSPITAL_COMMUNITY): Admission: RE | Admit: 2023-04-13 | Payer: Medicaid Other | Source: Ambulatory Visit

## 2023-04-13 LAB — TROPONIN I (HIGH SENSITIVITY)
Troponin I (High Sensitivity): 6 ng/L (ref <18)
Troponin I (High Sensitivity): 10 ng/L (ref <18)

## 2023-04-13 LAB — CBC WITH DIFFERENTIAL/PLATELET
Abs Immature Granulocytes: 0.02 10*3/uL (ref 0.00–0.07)
Basophils Absolute: 0 10*3/uL (ref 0.0–0.1)
Basophils Relative: 0 %
Eosinophils Absolute: 0.3 10*3/uL (ref 0.0–0.5)
Eosinophils Relative: 2 %
HCT: 33.6 % — ABNORMAL LOW (ref 36.0–46.0)
Hemoglobin: 10.5 g/dL — ABNORMAL LOW (ref 12.0–15.0)
Immature Granulocytes: 0 %
Lymphocytes Relative: 28 %
Lymphs Abs: 3.2 10*3/uL (ref 0.7–4.0)
MCH: 24 pg — ABNORMAL LOW (ref 26.0–34.0)
MCHC: 31.3 g/dL (ref 30.0–36.0)
MCV: 76.9 fL — ABNORMAL LOW (ref 80.0–100.0)
Monocytes Absolute: 0.9 10*3/uL (ref 0.1–1.0)
Monocytes Relative: 8 %
Neutro Abs: 7.2 10*3/uL (ref 1.7–7.7)
Neutrophils Relative %: 62 %
Platelets: 304 10*3/uL (ref 150–400)
RBC: 4.37 MIL/uL (ref 3.87–5.11)
RDW: 16.2 % — ABNORMAL HIGH (ref 11.5–15.5)
WBC: 11.6 10*3/uL — ABNORMAL HIGH (ref 4.0–10.5)
nRBC: 0 % (ref 0.0–0.2)

## 2023-04-13 LAB — COMPREHENSIVE METABOLIC PANEL
ALT: 10 U/L (ref 0–44)
AST: 14 U/L — ABNORMAL LOW (ref 15–41)
Albumin: 3.5 g/dL (ref 3.5–5.0)
Alkaline Phosphatase: 72 U/L (ref 38–126)
Anion gap: 12 (ref 5–15)
BUN: 20 mg/dL (ref 6–20)
CO2: 21 mmol/L — ABNORMAL LOW (ref 22–32)
Calcium: 9.4 mg/dL (ref 8.9–10.3)
Chloride: 103 mmol/L (ref 98–111)
Creatinine, Ser: 1.19 mg/dL — ABNORMAL HIGH (ref 0.44–1.00)
GFR, Estimated: >60 mL/min (ref >60)
Glucose, Bld: 95 mg/dL (ref 70–99)
Potassium: 3.8 mmol/L (ref 3.5–5.1)
Sodium: 136 mmol/L (ref 135–145)
Total Bilirubin: 0.2 mg/dL — ABNORMAL LOW (ref 0.3–1.2)
Total Protein: 7.8 g/dL (ref 6.5–8.1)

## 2023-04-13 LAB — HCG, SERUM, QUALITATIVE: Preg, Serum: NEGATIVE

## 2023-04-13 LAB — D-DIMER, QUANTITATIVE: D-Dimer, Quant: 1.12 ug{FEU}/mL — ABNORMAL HIGH (ref 0.00–0.50)

## 2023-04-13 MED ORDER — MELOXICAM 15 MG PO TABS: 15.0000 mg | ORAL_TABLET | Freq: Every day | ORAL | 0 refills | Status: AC

## 2023-04-13 MED ORDER — HYDROCODONE-ACETAMINOPHEN 5-325 MG PO TABS
1.0000 | ORAL_TABLET | Freq: Once | ORAL | Status: AC
  Administered 2023-04-13: 1 via ORAL
  Filled 2023-04-13: qty 1

## 2023-04-13 MED ORDER — OMEPRAZOLE 20 MG PO CPDR: 20.0000 mg | DELAYED_RELEASE_CAPSULE | Freq: Every day | ORAL | 0 refills | Status: DC

## 2023-04-13 MED ORDER — IOHEXOL 350 MG/ML SOLN
75.0000 mL | Freq: Once | INTRAVENOUS | Status: AC | PRN
  Administered 2023-04-13: 75 mL via INTRAVENOUS

## 2023-04-13 MED ORDER — KETOROLAC TROMETHAMINE 15 MG/ML IJ SOLN
15.0000 mg | Freq: Once | INTRAMUSCULAR | Status: AC
  Administered 2023-04-13: 15 mg via INTRAVENOUS
  Filled 2023-04-13: qty 1

## 2023-04-13 NOTE — ED Provider Notes (Signed)
[facility]MENT AT [facility] Provider Note   CSN: [id number] Arrival date & time: 04/12/23  2344     History  Chief Complaint  Patient presents with   Shortness of Breath    Robin Perez is a 37 y.o. female.  37 year old female presents with complaint of pain and swelling in her left foot onset 3 weeks ago without injury. States she has been walking on a track, unsure if that has injured her foot. She went to [facility] 04/01/23, had an XR was provided with a post op shoe and rx for naproxen which she is taking without improvement.  States she finished her homework tonight, went to bed at 10pm, felt tight in her chest and SHOB. Symptoms lasted a few minutes and resolved. Denies fevers, chills, sick contacts. She is congested but states she has chronic sinus problems and this is unchanged. History of HTN, not on medications, also told she has lymphedema in her legs. Smoker, no OCP use, no recent extended travel, no personal or family history of PE or DVT.       Home Medications Prior to Admission medications   Medication Sig Start Date End Date Taking? Authorizing Provider  fluticasone (FLONASE) 50 MCG/ACT nasal spray Place 1 spray into both nostrils daily. 03/28/23  Yes [provider]  meloxicam (MOBIC) 15 MG tablet Take 1 tablet (15 mg total) by mouth daily for 10 days. 04/13/23 04/23/23 Yes Jeannie Fend, PA-C  omeprazole (PRILOSEC) 20 MG capsule Take 1 capsule (20 mg total) by mouth daily. 04/13/23  Yes Jeannie Fend, PA-C      Allergies    Patient has no known allergies.    Review of Systems   Review of Systems Negative except as per HPI Physical Exam Updated Vital Signs BP 119/76   Pulse (!) 50   Temp 97.9 F (36.6 C) (Oral)   Resp 18   LMP 03/25/2023 (Approximate)   SpO2 100%  Physical Exam Vitals and nursing note reviewed.  Constitutional:      General: She is not in acute distress.    Appearance: She is well-developed. She is  not diaphoretic.  HENT:     Head: Normocephalic and atraumatic.  Cardiovascular:     Rate and Rhythm: Normal rate and regular rhythm.  Pulmonary:     Effort: Pulmonary effort is normal.     Breath sounds: Normal breath sounds.  Abdominal:     Palpations: Abdomen is soft.     Tenderness: There is no abdominal tenderness.  Musculoskeletal:     Right lower leg: No tenderness. Edema present.     Left lower leg: No tenderness. No edema.     Right foot: Normal range of motion. No swelling, tenderness or bony tenderness.     Left foot: Normal range of motion. Swelling and tenderness present. No bony tenderness.     Comments: Swelling to the dorsum of the left foot, TTP along metatarsals 1-5 without crepitus, no overlying skin changes. Ankle is non tender, no calf swelling or tenderness.   Skin:    General: Skin is warm and dry.     Findings: No erythema or rash.  Neurological:     Mental Status: She is alert and oriented to person, place, and time.  Psychiatric:        Behavior: Behavior normal.     ED Results / Procedures / Treatments   Labs (all labs ordered are listed, but only abnormal results  are displayed) Labs Reviewed  CBC WITH DIFFERENTIAL/PLATELET - Abnormal; Notable for the following components:      Result Value   WBC 11.6 (*)    Hemoglobin 10.5 (*)    HCT 33.6 (*)    MCV 76.9 (*)    MCH 24.0 (*)    RDW 16.2 (*)    All other components within normal limits  COMPREHENSIVE METABOLIC PANEL - Abnormal; Notable for the following components:   CO2 21 (*)    Creatinine, Ser 1.19 (*)    AST 14 (*)    Total Bilirubin 0.2 (*)    All other components within normal limits  D-DIMER, QUANTITATIVE (NOT AT [facility]) - Abnormal; Notable for the following components:   D-Dimer, Quant 1.12 (*)    All other components within normal limits  HCG, SERUM, QUALITATIVE  TROPONIN I (HIGH SENSITIVITY)  TROPONIN I (HIGH SENSITIVITY)    EKG None  Radiology CT Angio Chest PE W/Cm &/Or Wo  Cm  Result Date: 04/13/2023 CLINICAL DATA:  37 year old female with shortness of breath onset yesterday and persistent left lower extremity swelling. EXAM: CT ANGIOGRAPHY CHEST WITH CONTRAST TECHNIQUE: Multidetector CT imaging of the chest was performed using the standard protocol during bolus administration of intravenous contrast. Multiplanar CT image reconstructions and MIPs were obtained to evaluate the vascular anatomy. RADIATION DOSE REDUCTION: This exam was performed according to the departmental dose-optimization program which includes automated exposure control, adjustment of the mA and/or kV according to patient size and/or use of iterative reconstruction technique. CONTRAST:  75mL OMNIPAQUE IOHEXOL 350 MG/ML SOLN COMPARISON:  Chest radiographs 04/12/2023. FINDINGS: Cardiovascular: Adequate contrast bolus timing in the pulmonary arterial tree. No pulmonary artery filling defect. Heart size within normal limits. No pericardial effusion. Negative visible aorta. Incidental tortuosity of the proximal great vessels. Mediastinum/Nodes: Negative, no mediastinal mass or lymphadenopathy. Lungs/Pleura: Major airways are patent and both lungs are essentially clear. No pleural effusion or pathologic pulmonary opacity. Upper Abdomen: Partially visible gallbladder with some evidence of small stone(s). Otherwise negative visible mostly noncontrast liver, spleen, pancreas, adrenal glands, kidneys, and bowel. Musculoskeletal: Negative. Review of the MIP images confirms the above findings. IMPRESSION: 1. Negative for pulmonary embolus. Negative CTA appearance of the Chest. 2. Probable cholelithiasis. Electronically Signed   By: Odessa Fleming M.D.   On: 04/13/2023 04:26   DG Chest 2 View  Result Date: 04/13/2023 CLINICAL DATA:  Shortness of breath EXAM: CHEST - 2 VIEW COMPARISON:  Radiographs 04/05/2015 FINDINGS: Normal cardiomediastinal silhouette. No focal consolidation, pleural effusion, or pneumothorax. No displaced rib  fractures. IMPRESSION: No acute cardiopulmonary disease. Electronically Signed   By: Minerva Fester M.D.   On: 04/13/2023 00:54    Procedures Procedures    Medications Ordered in ED Medications  HYDROcodone-acetaminophen (NORCO/VICODIN) 5-325 MG per tablet 1 tablet (1 tablet Oral Given 04/13/23 0145)  iohexol (OMNIPAQUE) 350 MG/ML injection 75 mL (75 mLs Intravenous Contrast Given 04/13/23 0402)  ketorolac (TORADOL) 15 MG/ML injection 15 mg (15 mg Intravenous Given 04/13/23 0456)    ED Course/ Medical Decision Making/ A&P                                 Medical Decision Making Amount and/or Complexity of Data Reviewed Labs: ordered. Radiology: ordered.  Risk Prescription drug management.   This patient presents to the ED for concern of left foot pain and swelling, SHOB/chest pain, this involves an extensive number of treatment options,  and is a complaint that carries with it a high risk of complications and morbidity.  The differential diagnosis includes but not limited to ACS, PE, DVT, stress fracture, asthma exacerbation    Co morbidities that complicate the patient evaluation  Asthma   Additional history obtained:  Additional history obtained from significant other at bedside who contributes to history as above External records from outside source obtained and reviewed including visit to UC dated 04/01/23, XR reviewed, no acute abnormality, agree with radiologist interpretation    Lab Tests:  I Ordered, and personally interpreted labs.  The pertinent results include: CBC with mild extensive 11.6, mild anemia with hemoglobin 10.5.  hCG negative.  Troponins are negative at 6 and 10.  D-dimer is elevated at 1.12.  CMP with creatinine 1.19, slight increase compared to 2 months ago.   Imaging Studies ordered:  I ordered imaging studies including CT PE study, chest x-ray I independently visualized and interpreted imaging which showed negative for PE, chest x-ray negative for  acute abnormality I agree with the radiologist interpretation   Cardiac Monitoring: / EKG:  The patient was maintained on a cardiac monitor.  I personally viewed and interpreted the cardiac monitored which showed an underlying rhythm of: Sinus bradycardia rate 57   Consultations Obtained:  I requested consultation with the Dr. Oletta Cohn, ER attending,  and discussed lab and imaging findings as well as pertinent plan - they recommend: Agrees with plan of care   Problem List / ED Course / Critical interventions / Medication management  37 year old female with arrival complaint of shortness of breath and chest tightness however at time of exam primary complaint is left foot pain and swelling ongoing for the past several weeks.  Negative x-ray at urgent care on August 10, does have swelling to the dorsum of the foot with generalized tenderness, no pain in the ankle.  There is no swelling or pain in the left calf.  She does have right leg swelling which she notes is her lymphedema.  Chest workup is found to have elevated D-dimer, followed with CT PE study which is negative for PE.  Patient is to return later today for venous duplex left lower extremity to rule out DVT as cause for her elevated dimer however I suspect that her dimer is elevated secondary to inflammation in the foot.  She is provided with crutches, advised to weight-bear as tolerated.  Provided with referral to orthopedics, advised to call and schedule appointment.  Recommend discontinue the naproxen, can start meloxicam.  Provided with omeprazole for ongoing NSAID use. I ordered medication including Co., Toradol for foot pain Reevaluation of the patient after these medicines showed that the patient improved I have reviewed the patients home medicines and have made adjustments as needed   Social Determinants of Health:  Lives with family   Test / Admission - Considered:  Stable for discharge with plan to return for venous Doppler  left lower extremity later today.         Final Clinical Impression(s) / ED Diagnoses Final diagnoses:  Swelling of left foot  Positive D dimer    Rx / DC Orders ED Discharge Orders          Ordered    LE VENOUS        04/13/23 0436    meloxicam (MOBIC) 15 MG tablet  Daily        04/13/23 0448    omeprazole (PRILOSEC) 20 MG capsule  Daily  04/13/23 0448              Jeannie Fend, PA-C 04/13/23 0540    Gilda Crease, MD 04/13/23 [phone]

## 2023-04-13 NOTE — Discharge Instructions (Addendum)
Return to the ER later today for an ultrasound of your left leg. Please call orthopedics to schedule an appointment for your left foot pain. Continue to wear your shoe. Provided with crutches, weight bear as tolerated.   IMPORTANT PATIENT INSTRUCTIONS:  You have been scheduled for an Outpatient Vascular Study at [facility].    If tomorrow is a Saturday, Sunday or holiday, please go to the [facility] Emergency Department Registration Desk at 11 am tomorrow morning and tell them you are there for a vascular study.   If tomorrow is a weekday (Monday-Friday), please go to [facility] Entrance [facility] Clinic Registration at 11 am and tell them you are there for a vascular study.

## 2023-04-13 NOTE — ED Notes (Signed)
Patient transported to CT 

## 2023-04-14 ENCOUNTER — Ambulatory Visit (HOSPITAL_COMMUNITY)
Admission: RE | Admit: 2023-04-14 | Discharge: 2023-04-14 | Disposition: A | Discharge status: Home / Self Care | Payer: Medicaid Other | Source: Ambulatory Visit | Attending: Emergency Medicine | Admitting: Emergency Medicine

## 2023-04-14 DIAGNOSIS — M7989 Other specified soft tissue disorders: Secondary | ICD-10-CM | POA: Diagnosis not present

## 2023-04-17 ENCOUNTER — Encounter: Payer: Self-pay | Admitting: Orthopedic Surgery

## 2023-04-17 ENCOUNTER — Telehealth: Payer: Self-pay | Admitting: Orthopedic Surgery

## 2023-04-17 ENCOUNTER — Ambulatory Visit (INDEPENDENT_AMBULATORY_CARE_PROVIDER_SITE_OTHER): Payer: Medicaid Other | Admitting: Orthopedic Surgery

## 2023-04-17 VITALS — BP 129/86 | HR 81 | Ht 67.0 in | Wt 222.0 lb

## 2023-04-17 DIAGNOSIS — M79672 Pain in left foot: Secondary | ICD-10-CM | POA: Diagnosis not present

## 2023-04-17 MED ORDER — HYDROCODONE-ACETAMINOPHEN 5-325 MG PO TABS: 1.0000 | ORAL_TABLET | ORAL | 0 refills | Status: DC | PRN

## 2023-04-17 NOTE — Telephone Encounter (Signed)
Pt called stating [facility] is out of stock hydrocodone. Please send to [facility] [place]. Phone number is [phone].

## 2023-04-17 NOTE — Progress Notes (Signed)
Orthopedic Surgery Office Note   Assessment: Patient is a 37 y.o. female with left dorsal foot pain and swelling   Plan: -No acute operative intervention -Told her to keep using compression socks and, ice, elevate Patient has tried Tylenol, Aleve, ice, elevation, compression stocking, oral steroids, vicodin -Prescribed norco for additional pain relief -Weight-bear as tolerated bilateral lower extremities -Patient has tried conservative treatment for over 6 weeks now without any relief, so recommended MRI of the left foot to evaluate further -Follow-up with Dr. Lajoyce Corners after the MRI  ___________________________________________________________________________  History: Patient has had 2 months of left foot pain.  She states there is no trauma or injury to the foot.  She feels pain on the dorsal aspect of the foot in the area of the midfoot and forefoot.  Pain has been getting worse with time.  She has noticed swelling in the area of the pain. She has not noticed any relief with any of the non-operative treatments tried besides the vicodin. Denies paresthesias and numbness.    Physical Exam:  BMI of 34.8  General: no acute distress, appears stated age Neurologic: alert, answering questions appropriately, following commands Respiratory: unlabored breathing on room air, symmetric chest rise Psychiatric: appropriate affect, normal cadence to speech  MSK:   -Left lower extremity  TTP over the dorsal aspect of the forefoot and midfoot, no other tenderness palpation over the remainder of the foot and ankle EHL/TA/GSC intact Plantar flexes and dorsiflexes toes Pain with passive range of motion of the toes particularly plantarflexion Ankle range of motion from neutral to 30 degrees plantarflexion Sensation intact to light touch in sural, saphenous, tibial, deep peroneal, and superficial peroneal nerve distributions Foot warm and well perfused   Imaging:  XR of the left foot from 04/01/2023  was independently reviewed and interpreted, showing no significant degenerative changes.  No fracture or dislocation seen.  CT angio from 04/13/2023 did not show any PE    Patient name: GEETHA HORNICK Patient MRN: [id number] Date: 04/17/23

## 2023-04-21 ENCOUNTER — Telehealth: Payer: Self-pay | Admitting: Orthopedic Surgery

## 2023-04-21 MED ORDER — HYDROCODONE-ACETAMINOPHEN 5-325 MG PO TABS: 1.0000 | ORAL_TABLET | ORAL | 0 refills | Status: DC | PRN

## 2023-04-21 NOTE — Addendum Note (Signed)
Addended by: Willia Craze on: 04/21/2023 10:48 AM   Modules accepted: Orders

## 2023-04-21 NOTE — Telephone Encounter (Signed)
Pt call requesting a refill for Hydrocodone-acetaminophen 5-325MG . Pt stated she did take a few but she also dropped a few outside. Pt preferred pharmacy is [facility] on [address] [place], [place] [address]. Best call back #[phone]

## 2023-04-25 ENCOUNTER — Telehealth: Payer: Self-pay | Admitting: Orthopedic Surgery

## 2023-04-25 DIAGNOSIS — M79672 Pain in left foot: Secondary | ICD-10-CM

## 2023-04-25 MED ORDER — HYDROCODONE-ACETAMINOPHEN 5-325 MG PO TABS: 1.0000 | ORAL_TABLET | ORAL | 0 refills | Status: AC | PRN

## 2023-04-25 NOTE — Telephone Encounter (Signed)
Pt requesting Hydrocodone refill sent to [facility] pharmacy on file pt is getting MRI on Thursday and pain meds will run out by tomorrow please advise

## 2023-04-25 NOTE — Telephone Encounter (Signed)
Pt spouse called requesting a medication refill for pt. States pt have continuous swelling on LT foot. Medication: HYDROcodone 5-325MG . Pharmacy: [address] [place], [place], [place] [address]. Best call back # [phone]

## 2023-04-27 ENCOUNTER — Ambulatory Visit (HOSPITAL_COMMUNITY)
Admission: RE | Admit: 2023-04-27 | Discharge: 2023-04-27 | Disposition: A | Discharge status: Home / Self Care | Payer: Medicaid Other | Source: Ambulatory Visit | Attending: Orthopedic Surgery | Admitting: Orthopedic Surgery

## 2023-04-27 DIAGNOSIS — M79672 Pain in left foot: Secondary | ICD-10-CM | POA: Insufficient documentation

## 2023-05-01 ENCOUNTER — Telehealth: Payer: Self-pay | Admitting: Orthopedic Surgery

## 2023-05-01 MED ORDER — HYDROCODONE-ACETAMINOPHEN 5-325 MG PO TABS: 1.0000 | ORAL_TABLET | Freq: Four times a day (QID) | ORAL | 0 refills | Status: DC | PRN

## 2023-05-01 NOTE — Telephone Encounter (Signed)
I called advised meds sent to pharm

## 2023-05-01 NOTE — Addendum Note (Signed)
Addended by: Willia Craze on: 05/01/2023 08:29 AM   Modules accepted: Orders

## 2023-05-01 NOTE — Telephone Encounter (Signed)
Patient called and she needs a refill on hydrocodone. CB#[phone]

## 2023-05-04 ENCOUNTER — Telehealth: Payer: Self-pay | Admitting: Orthopedic Surgery

## 2023-05-04 MED ORDER — HYDROCODONE-ACETAMINOPHEN 5-325 MG PO TABS: 1.0000 | ORAL_TABLET | ORAL | 0 refills | Status: DC | PRN

## 2023-05-04 NOTE — Telephone Encounter (Signed)
Pt called requesting a refill for pain pills and need a quantity of 30. She states she is has sever pains. Please send to [facility]. Please call pt about this matter at [phone].

## 2023-05-04 NOTE — Telephone Encounter (Signed)
Referral has been fxed

## 2023-05-07 ENCOUNTER — Encounter (HOSPITAL_COMMUNITY): Payer: Self-pay

## 2023-05-07 ENCOUNTER — Ambulatory Visit (HOSPITAL_COMMUNITY)
Admission: EM | Admit: 2023-05-07 | Discharge: 2023-05-07 | Disposition: A | Discharge status: Home / Self Care | Payer: Medicaid Other | Attending: Emergency Medicine | Admitting: Emergency Medicine

## 2023-05-07 DIAGNOSIS — J329 Chronic sinusitis, unspecified: Secondary | ICD-10-CM | POA: Diagnosis not present

## 2023-05-07 DIAGNOSIS — J069 Acute upper respiratory infection, unspecified: Secondary | ICD-10-CM | POA: Diagnosis not present

## 2023-05-07 MED ORDER — FLUTICASONE PROPIONATE 50 MCG/ACT NA SUSP: 2.0000 | Freq: Every day | NASAL | 1 refills | Status: AC

## 2023-05-07 MED ORDER — CETIRIZINE HCL 10 MG PO TABS: 10.0000 mg | ORAL_TABLET | Freq: Every day | ORAL | 0 refills | Status: AC

## 2023-05-07 NOTE — ED Triage Notes (Signed)
Patient having congestion (nasal/chest) was using Flonase nasal spray but has run out. No cough, runny nose, or fever. Patient having fatigue and weakness. Onset of symptoms Thursday.

## 2023-05-07 NOTE — ED Provider Notes (Signed)
[facility]    CSN: [id number] Arrival date & time: 05/07/23  1436      History   Chief Complaint Chief Complaint  Patient presents with   Nasal Congestion    HPI Robin Perez is a 37 y.o. female.   Patient presents with chronic congestion and states that she ran out of her Flonase nasal spray. Patient states that on Thursday she started to develop fatigue and mild cough.  Denies shortness of breath, fever, abdominal pain, nausea, vomiting, and diarrhea.     Past Medical History:  Diagnosis Date   Asthma    Incisional pain 05/09/2013   Will rx cipro   Vertigo     Patient Active Problem List   Diagnosis Date Noted   Left sided numbness 12/04/2022   Lower extremity cellulitis 06/25/2015   Sepsis (HCC) 06/25/2015   Hematuria, undiagnosed cause 06/04/2015   Chronic sinusitis 06/04/2015   Cellulitis 06/04/2015   Microcytic anemia 06/04/2015   Tobacco use 06/04/2015   Cellulitis and abscess of lower extremity 06/04/2015   Incisional pain 05/09/2013   Sterilization consult 03/15/2013   History of cesarean delivery, currently pregnant 11/20/2012   Supervision of other normal pregnancy 11/20/2012   Asthma 10/24/2012    Past Surgical History:  Procedure Laterality Date   CESAREAN SECTION     CESAREAN SECTION     CESAREAN SECTION WITH BILATERAL TUBAL LIGATION N/A 04/23/2013   Procedure: CESAREAN SECTION WITH BILATERAL TUBAL LIGATION;  Surgeon: Willodean Rosenthal, MD;  Location: [facility];  Service: Obstetrics;  Laterality: N/A;    OB History     Gravida  3   Para  3   Term  3   Preterm      AB      Living  3      SAB      IAB      Ectopic      Multiple      Live Births  3            Home Medications    Prior to Admission medications   Medication Sig Start Date End Date Taking? Authorizing Provider  cetirizine (ZYRTEC ALLERGY) 10 MG tablet Take 1 tablet (10 mg total) by mouth daily. 05/07/23  Yes Susann Givens, Bharath Bernstein A, NP   fluticasone (FLONASE) 50 MCG/ACT nasal spray Place 2 sprays into both nostrils daily. 05/07/23  Yes Wynonia Lawman A, NP  HYDROcodone-acetaminophen (NORCO/VICODIN) 5-325 MG tablet Take 1 tablet by mouth every 4 (four) hours as needed for up to 5 days for severe pain. 05/04/23 05/09/23 Yes London Sheer, MD    Family History History reviewed. No pertinent family history.  Social History Social History   Tobacco Use   Smoking status: Former    Current packs/day: 0.25    Types: Cigarettes   Smokeless tobacco: Never  Vaping Use   Vaping status: Never Used  Substance Use Topics   Alcohol use: Yes    Comment: occ   Drug use: No    Types: Benzodiazepines    Comment: denies     Allergies   Patient has no known allergies.   Review of Systems Review of Systems  Constitutional:  Positive for fatigue. Negative for chills and fever.  HENT:  Positive for congestion and postnasal drip. Negative for ear pain, rhinorrhea, sinus pressure, sinus pain, sneezing and sore throat.   Respiratory:  Negative for cough and shortness of breath.   Cardiovascular:  Negative for chest pain.  Gastrointestinal:  Negative for abdominal pain, diarrhea, nausea and vomiting.  Neurological:  Negative for dizziness, light-headedness and headaches.     Physical Exam Triage Vital Signs ED Triage Vitals  Encounter Vitals Group     BP 05/07/23 1537 (!) 151/91     Systolic BP Percentile --      Diastolic BP Percentile --      Pulse --      Resp 05/07/23 1537 18     Temp 05/07/23 1537 97.8 F (36.6 C)     Temp Source 05/07/23 1537 Oral     SpO2 05/07/23 1537 97 %     Weight 05/07/23 1537 220 lb (99.8 kg)     Height 05/07/23 1537 5\' 7"  (1.702 m)     Head Circumference --      Peak Flow --      Pain Score 05/07/23 1536 0     Pain Loc --      Pain Education --      Exclude from Growth Chart --    No data found.  Updated Vital Signs BP (!) 151/91 (BP Location: Left Wrist)   Temp 97.8 F (36.6  C) (Oral)   Resp 18   Ht 5\' 7"  (1.702 m)   Wt 220 lb (99.8 kg)   LMP 04/23/2023 (Approximate)   SpO2 97%   BMI 34.46 kg/m   Visual Acuity Right Eye Distance:   Left Eye Distance:   Bilateral Distance:    Right Eye Near:   Left Eye Near:    Bilateral Near:     Physical Exam Vitals and nursing note reviewed.  Constitutional:      General: She is awake. She is not in acute distress.    Appearance: Normal appearance. She is well-developed and well-groomed. She is not ill-appearing, toxic-appearing or diaphoretic.  HENT:     Right Ear: Tympanic membrane, ear canal and external ear normal.     Left Ear: Tympanic membrane, ear canal and external ear normal.     Nose: Congestion and rhinorrhea present. No nasal tenderness.     Right Sinus: No maxillary sinus tenderness or frontal sinus tenderness.     Left Sinus: No maxillary sinus tenderness or frontal sinus tenderness.     Mouth/Throat:     Mouth: Mucous membranes are moist.     Pharynx: Posterior oropharyngeal erythema and postnasal drip present. No pharyngeal swelling or oropharyngeal exudate.     Tonsils: No tonsillar exudate.  Eyes:     Extraocular Movements: Extraocular movements intact.     Pupils: Pupils are equal, round, and reactive to light.  Cardiovascular:     Rate and Rhythm: Normal rate.     Heart sounds: Normal heart sounds.  Pulmonary:     Effort: Pulmonary effort is normal. No respiratory distress.     Breath sounds: No wheezing.  Musculoskeletal:     Cervical back: Normal range of motion.  Skin:    General: Skin is warm and dry.  Neurological:     Mental Status: She is alert and oriented to person, place, and time.     GCS: GCS eye subscore is 4. GCS verbal subscore is 5. GCS motor subscore is 6.  Psychiatric:        Behavior: Behavior is cooperative.      UC Treatments / Results  Labs (all labs ordered are listed, but only abnormal results are displayed) Labs Reviewed - No data to  display  EKG   Radiology No  results found.  Procedures Procedures (including critical care time)  Medications Ordered in UC Medications - No data to display  Initial Impression / Assessment and Plan / UC Course  I have reviewed the triage vital signs and the nursing notes.  Pertinent labs & imaging results that were available during my care of the patient were reviewed by me and considered in my medical decision making (see chart for details).     Patient presented with chronic congestion and states that she ran out of her Flonase nasal spray.  Patient states that on Thursday she started to develop fatigue and mild cough.  Denies shortness of breath, fever, abdominal pain, nausea, vomiting, and diarrhea. Upon assessment patient has congestion and rhinorrhea, and mild erythema to throat.  Patient's blood pressure is elevated today. Denies dizziness, headaches, blurred vision, weakness, and numbness. Refilled prescription for Flonase. Prescribed Zyrtec to help with daily congestion  Recommended getting established with a PCP regarding blood pressure and chronic sinus issues. Discussed return precautions. Final Clinical Impressions(s) / UC Diagnoses   Final diagnoses:  Viral upper respiratory illness  Chronic rhinosinusitis     Discharge Instructions      Use Flonase as needed for congestion. Taking Zyrtec daily can also help with congestion. Follow-up with primary care doctor regarding high blood pressure and chronic sinus issues. Return here as needed.     ED Prescriptions     Medication Sig Dispense Auth. Provider   fluticasone (FLONASE) 50 MCG/ACT nasal spray Place 2 sprays into both nostrils daily. 11.1 mL Wynonia Lawman A, NP   cetirizine (ZYRTEC ALLERGY) 10 MG tablet Take 1 tablet (10 mg total) by mouth daily. 30 tablet Wynonia Lawman A, NP      PDMP not reviewed this encounter.   Wynonia Lawman A, NP 05/07/23 1650

## 2023-05-07 NOTE — Discharge Instructions (Signed)
Use Flonase as needed for congestion. Taking Zyrtec daily can also help with congestion. Follow-up with primary care doctor regarding high blood pressure and chronic sinus issues. Return here as needed.

## 2023-05-08 ENCOUNTER — Telehealth: Payer: Self-pay | Admitting: Orthopedic Surgery

## 2023-05-08 MED ORDER — HYDROCODONE-ACETAMINOPHEN 5-325 MG PO TABS: 1.0000 | ORAL_TABLET | ORAL | 0 refills | Status: AC | PRN

## 2023-05-08 NOTE — Telephone Encounter (Signed)
Patient called needing Rx refilled Hydrocodone. The number to contact patient is [phone]

## 2023-05-12 ENCOUNTER — Telehealth: Payer: Self-pay | Admitting: Orthopedic Surgery

## 2023-05-12 NOTE — Telephone Encounter (Signed)
I called and advised patient of Dr. Kathi Der message. I gave her the phone number to [facility] medical to call and make an appt.

## 2023-05-12 NOTE — Telephone Encounter (Signed)
Patient called to get a refill on Pain medication. CB#[phone]

## 2023-05-15 ENCOUNTER — Ambulatory Visit (HOSPITAL_COMMUNITY)
Admission: EM | Admit: 2023-05-15 | Discharge: 2023-05-15 | Disposition: A | Discharge status: Home / Self Care | Payer: Medicaid Other | Attending: Emergency Medicine | Admitting: Emergency Medicine

## 2023-05-15 ENCOUNTER — Other Ambulatory Visit: Payer: Self-pay

## 2023-05-15 ENCOUNTER — Telehealth: Payer: Self-pay | Admitting: Orthopedic Surgery

## 2023-05-15 ENCOUNTER — Encounter (HOSPITAL_COMMUNITY): Payer: Self-pay | Admitting: Emergency Medicine

## 2023-05-15 DIAGNOSIS — M546 Pain in thoracic spine: Secondary | ICD-10-CM

## 2023-05-15 LAB — POCT URINE PREGNANCY: Preg Test, Ur: NEGATIVE

## 2023-05-15 LAB — POCT URINALYSIS DIP (MANUAL ENTRY)
Bilirubin, UA: NEGATIVE
Glucose, UA: NEGATIVE mg/dL
Ketones, POC UA: NEGATIVE mg/dL
Leukocytes, UA: NEGATIVE
Nitrite, UA: NEGATIVE
Protein Ur, POC: 100 mg/dL — AB
Spec Grav, UA: 1.025 (ref 1.010–1.025)
Urobilinogen, UA: 0.2 E.U./dL
pH, UA: 5.5 (ref 5.0–8.0)

## 2023-05-15 MED ORDER — METHOCARBAMOL 500 MG PO TABS: 500.0000 mg | ORAL_TABLET | Freq: Two times a day (BID) | ORAL | 0 refills | Status: AC | PRN

## 2023-05-15 MED ORDER — HYDROCODONE-ACETAMINOPHEN 5-325 MG PO TABS: 1.0000 | ORAL_TABLET | ORAL | 0 refills | Status: DC | PRN

## 2023-05-15 NOTE — Telephone Encounter (Signed)
Patient's spouse Onalee Hua called advised patient need Rx refilled Hydrocodone. The number to contact patient is [phone]

## 2023-05-15 NOTE — Discharge Instructions (Addendum)
You can take Robaxin twice daily as needed for back pain and spasms. Please do not take this at the same time has prescribed hydrocodone. Otherwise you can alternate between Ibuprofen and Tylenol as needed for pain. If you develop worsening back pain, blood in urine, and high fevers please seek immediate medical treatment in ER. Return here as needed.

## 2023-05-15 NOTE — ED Triage Notes (Signed)
Back pain started 2 days ago. Bilateral, mid-lower back pain.  No new activities  Is taking pain medication for foot, has not added any other medicines.

## 2023-05-15 NOTE — ED Provider Notes (Signed)
[facility]    CSN: [id number] Arrival date & time: 05/15/23  1207      History   Chief Complaint Chief Complaint  Patient presents with   Back Pain    HPI Robin Perez is a 37 y.o. female.   Patient presents with mid to lower back pain that began 2 days ago. Denies any known injury. Patient currently taking hydrocodone-acetaminophen for unrelated foot pain, but reports this does not help with her back pain.  Denies chest pain, shortness of breath, fever, abdominal pain, nausea, vomiting, diarrhea, dysuria, and hematuria.   Back Pain Associated symptoms: no abdominal pain, no chest pain, no dysuria, no fever, no headaches, no pelvic pain and no weakness     Past Medical History:  Diagnosis Date   Asthma    Incisional pain 05/09/2013   Will rx cipro   Vertigo     Patient Active Problem List   Diagnosis Date Noted   Left sided numbness 12/04/2022   Lower extremity cellulitis 06/25/2015   Sepsis (HCC) 06/25/2015   Hematuria, undiagnosed cause 06/04/2015   Chronic sinusitis 06/04/2015   Cellulitis 06/04/2015   Microcytic anemia 06/04/2015   Tobacco use 06/04/2015   Cellulitis and abscess of lower extremity 06/04/2015   Incisional pain 05/09/2013   Sterilization consult 03/15/2013   History of cesarean delivery, currently pregnant 11/20/2012   Supervision of other normal pregnancy 11/20/2012   Asthma 10/24/2012    Past Surgical History:  Procedure Laterality Date   CESAREAN SECTION     CESAREAN SECTION     CESAREAN SECTION WITH BILATERAL TUBAL LIGATION N/A 04/23/2013   Procedure: CESAREAN SECTION WITH BILATERAL TUBAL LIGATION;  Surgeon: Willodean Rosenthal, MD;  Location: [facility];  Service: Obstetrics;  Laterality: N/A;    OB History     Gravida  3   Para  3   Term  3   Preterm      AB      Living  3      SAB      IAB      Ectopic      Multiple      Live Births  3            Home Medications    Prior to Admission  medications   Medication Sig Start Date End Date Taking? Authorizing Provider  methocarbamol (ROBAXIN) 500 MG tablet Take 1 tablet (500 mg total) by mouth 2 (two) times daily as needed for muscle spasms. 05/15/23  Yes Wynonia Lawman A, NP  cetirizine (ZYRTEC ALLERGY) 10 MG tablet Take 1 tablet (10 mg total) by mouth daily. Patient not taking: Reported on 05/15/2023 05/07/23   Wynonia Lawman A, NP  fluticasone [facility]) 50 MCG/ACT nasal spray Place 2 sprays into both nostrils daily. 05/07/23   Wynonia Lawman A, NP  HYDROcodone-acetaminophen (NORCO/VICODIN) 5-325 MG tablet Take 1 tablet by mouth every 4 (four) hours as needed for up to 5 days for moderate pain. 05/15/23 05/20/23  London Sheer, MD    Family History History reviewed. No pertinent family history.  Social History Social History   Tobacco Use   Smoking status: Former    Current packs/day: 0.25    Types: Cigarettes   Smokeless tobacco: Never  Vaping Use   Vaping status: Never Used  Substance Use Topics   Alcohol use: Yes    Comment: occ   Drug use: No    Types: Benzodiazepines    Comment: denies  Allergies   Patient has no known allergies.   Review of Systems Review of Systems  Constitutional:  Negative for chills, fatigue and fever.  Respiratory:  Negative for shortness of breath.   Cardiovascular:  Negative for chest pain.  Gastrointestinal:  Negative for abdominal pain, constipation, diarrhea, nausea and vomiting.  Genitourinary:  Positive for flank pain. Negative for difficulty urinating, dysuria, hematuria and pelvic pain.  Musculoskeletal:  Positive for back pain. Negative for gait problem, neck pain and neck stiffness.  Neurological:  Negative for dizziness, weakness and headaches.     Physical Exam Triage Vital Signs ED Triage Vitals  Encounter Vitals Group     BP 05/15/23 1357 128/87     Systolic BP Percentile --      Diastolic BP Percentile --      Pulse Rate 05/15/23 1357 82     Resp  05/15/23 1357 18     Temp 05/15/23 1357 98 F (36.7 C)     Temp Source 05/15/23 1357 Oral     SpO2 05/15/23 1357 97 %     Weight --      Height --      Head Circumference --      Peak Flow --      Pain Score 05/15/23 1354 9     Pain Loc --      Pain Education --      Exclude from Growth Chart --    No data found.  Updated Vital Signs BP 128/87 (BP Location: Left Arm) Comment (BP Location): large cuff  Pulse 82   Temp 98 F (36.7 C) (Oral)   Resp 18   LMP 04/23/2023 (Approximate)   SpO2 97%   Visual Acuity Right Eye Distance:   Left Eye Distance:   Bilateral Distance:    Right Eye Near:   Left Eye Near:    Bilateral Near:     Physical Exam Vitals and nursing note reviewed.  Constitutional:      General: She is awake. She is not in acute distress.    Appearance: Normal appearance. She is well-developed and well-groomed. She is not ill-appearing, toxic-appearing or diaphoretic.  Cardiovascular:     Rate and Rhythm: Normal rate.     Heart sounds: Normal heart sounds.  Pulmonary:     Effort: Pulmonary effort is normal.     Breath sounds: Normal breath sounds.  Abdominal:     General: Abdomen is flat. There is no distension.     Palpations: Abdomen is soft.     Tenderness: There is no abdominal tenderness. There is right CVA tenderness and left CVA tenderness. There is no guarding or rebound.  Musculoskeletal:        General: Tenderness present. No swelling, deformity or signs of injury.     Cervical back: Normal and normal range of motion.     Thoracic back: Tenderness present. No swelling, deformity or bony tenderness. Normal range of motion.     Lumbar back: Normal.     Comments: Mild tenderness to thoracic back pain with palpation.  Skin:    General: Skin is warm and dry.  Neurological:     Mental Status: She is alert.  Psychiatric:        Behavior: Behavior is cooperative.      UC Treatments / Results  Labs (all labs ordered are listed, but only  abnormal results are displayed) Labs Reviewed  POCT URINALYSIS DIP (MANUAL ENTRY) - Abnormal; Notable for the following components:  Result Value   Blood, UA large (*)    Protein Ur, POC =100 (*)    All other components within normal limits  POCT URINE PREGNANCY    EKG   Radiology No results found.  Procedures Procedures (including critical care time)  Medications Ordered in UC Medications - No data to display  Initial Impression / Assessment and Plan / UC Course  I have reviewed the triage vital signs and the nursing notes.  Pertinent labs & imaging results that were available during my care of the patient were reviewed by me and considered in my medical decision making (see chart for details).     Patient presented with 2-day history of mid to lower back pain.  Denies any known injury.  Reports taking hydrocodone-acetaminophen without relief.  Denies chest pain, shortness of breath, fever, abdominal pain, nausea, vomiting, diarrhea, dysuria, and hematuria.  Upon assessment patient is mildly tender to thoracic back and has bilateral CVA tenderness.  UA showed large RBCs.  Patient has history of unspecified hematuria.  Suggested taking Decadron injection to help with inflammation causing pain, but patient declined prescribed Robaxin as needed for back pain has been resolved.  Recommended Tylenol ibuprofen.  Discussed return, follow-up, emergency department precautions. Final Clinical Impressions(s) / UC Diagnoses   Final diagnoses:  Acute bilateral thoracic back pain     Discharge Instructions      You can take Robaxin twice daily as needed for back pain and spasms. Please do not take this at the same time has prescribed hydrocodone. Otherwise you can alternate between Ibuprofen and Tylenol as needed for pain. If you develop worsening back pain, blood in urine, and high fevers please seek immediate medical treatment in ER. Return here as needed.     ED Prescriptions      Medication Sig Dispense Auth. Provider   methocarbamol (ROBAXIN) 500 MG tablet Take 1 tablet (500 mg total) by mouth 2 (two) times daily as needed for muscle spasms. 20 tablet Wynonia Lawman A, NP      PDMP not reviewed this encounter.   Wynonia Lawman A, NP 05/15/23 [phone]

## 2023-05-15 NOTE — Telephone Encounter (Signed)
I called and lmom that meds have been sent to her pharmacy, however she is in the ER being evaluated for her back pain

## 2023-05-15 NOTE — Telephone Encounter (Signed)
Patient called advised she is completely out of her pain medication and would like a call back when the Rx is sent to the pharmacy. Patient said she is in so much pain. Patient was upset and crying.  The number to contact patient is [phone]

## 2023-05-18 ENCOUNTER — Ambulatory Visit (INDEPENDENT_AMBULATORY_CARE_PROVIDER_SITE_OTHER): Payer: Medicaid Other | Admitting: Orthopedic Surgery

## 2023-05-18 ENCOUNTER — Telehealth: Payer: Self-pay

## 2023-05-18 ENCOUNTER — Telehealth: Payer: Self-pay | Admitting: Orthopedic Surgery

## 2023-05-18 DIAGNOSIS — M79672 Pain in left foot: Secondary | ICD-10-CM | POA: Diagnosis not present

## 2023-05-18 DIAGNOSIS — M1A0720 Idiopathic chronic gout, left ankle and foot, without tophus (tophi): Secondary | ICD-10-CM | POA: Diagnosis not present

## 2023-05-18 MED ORDER — HYDROCODONE-ACETAMINOPHEN 5-325 MG PO TABS: 1.0000 | ORAL_TABLET | ORAL | 0 refills | Status: AC | PRN

## 2023-05-18 NOTE — Addendum Note (Signed)
Addended by: Willia Craze on: 05/18/2023 12:59 PM   Modules accepted: Orders

## 2023-05-18 NOTE — Telephone Encounter (Signed)
Duplicate message. 

## 2023-05-18 NOTE — Telephone Encounter (Signed)
Pt called stating she needs a refill on hydrocodone please. Pt advised to give her a call regarding a few questions. Burt Ek [phone]

## 2023-05-18 NOTE — Telephone Encounter (Signed)
Pt was in the office for a visit with Dr. Lajoyce Corners today and said that she needed a refill of her Hydrocodone from Dr. Christell Constant. I advised that I would send a message ot the assistant and the pt stated that she wanted to know before she left for her appt. I advised that we ask for a 24 hour turn around time on refill requests and she said that she would just " go upstairs and ask" I advised the pt that I did not know if Dr. Christell Constant was in the office or in surgery and if he was here he would be with pt's and so I would send a message and some one would call her. [phone]

## 2023-05-19 ENCOUNTER — Other Ambulatory Visit: Payer: Self-pay | Admitting: Orthopedic Surgery

## 2023-05-19 ENCOUNTER — Telehealth: Payer: Self-pay | Admitting: Orthopedic Surgery

## 2023-05-19 LAB — URIC ACID: Uric Acid, Serum: 7.5 mg/dL — ABNORMAL HIGH (ref 2.5–7.0)

## 2023-05-19 MED ORDER — ALLOPURINOL 100 MG PO TABS: 100.0000 mg | ORAL_TABLET | Freq: Two times a day (BID) | ORAL | 1 refills | Status: AC

## 2023-05-19 MED ORDER — COLCHICINE 0.6 MG PO TABS: 0.6000 mg | ORAL_TABLET | Freq: Every day | ORAL | 1 refills | Status: AC

## 2023-05-19 NOTE — Telephone Encounter (Signed)
Pt informed of results and directions of medications.

## 2023-05-19 NOTE — Telephone Encounter (Signed)
I called and advised her husband of Dr. Kathi Der message, she was asleep

## 2023-05-19 NOTE — Telephone Encounter (Signed)
Robin Mustard, MD  Gretta Arab C Call patient.  Her uric acid is elevated at 7.5, consistent with gout.  Please start her on allopurinol 100 mg twice a day.  Also call in a prescription for colchicine 0.6 mg daily as needed for acute pain.

## 2023-05-20 ENCOUNTER — Encounter: Payer: Self-pay | Admitting: Orthopedic Surgery

## 2023-05-20 NOTE — Progress Notes (Signed)
Office Visit Note   Patient: Robin Perez           Date of Birth: 1986-04-20           MRN: [id number] Visit Date: 05/18/2023              Requested by: No referring provider defined for this encounter. PCP: Pcp, No  Chief Complaint  Patient presents with   Left Foot - Pain      HPI: Patient is a 37 year old woman who presents with left midfoot pain for about 3 months.  Patient denies any injury.  She has taken Vicodin for pain.  She is status post an MRI scan.  Patient denies a history of diabetes or gout.  She states she does eat a lot of shrimp.  Assessment & Plan: Visit Diagnoses:  1. Pain in left foot   2. Idiopathic chronic gout of left foot without tophus     Plan: Uric acid was drawn that showed an elevated uric acid level consistent with gout.  Medication is called in and we will follow-up in 4 weeks with repeat uric acid blood work  Follow-Up Instructions: No follow-ups on file.   Ortho Exam  Patient is alert, oriented, no adenopathy, well-dressed, normal affect, normal respiratory effort. Examination patient has a good dorsalis pedis pulse.  There is no redness no cellulitis no evidence of Charcot arthropathy.  Patient has pain to light touch over the dorsum of the foot.  She has good ankle and subtalar range of motion.  There is more swelling in the left foot than the right.  Uric acid drawn today is 7.5.  Review of the MRI scan shows no destructive bony changes there is edema on the dorsum of the foot.  Imaging: No results found. No images are attached to the encounter.  Labs: Lab Results  Component Value Date   HGBA1C 5.6 12/05/2022   LABURIC 7.5 (H) 05/18/2023   REPTSTATUS 01/18/2016 FINAL 01/15/2016   CULT MULTIPLE SPECIES PRESENT, SUGGEST RECOLLECTION (A) 01/15/2016   LABORGA Multiple bacterial morphotypes present, none 11/20/2012   LABORGA predominant. Suggest appropriate recollection if 11/20/2012   LABORGA clinically indicated.  11/20/2012     Lab Results  Component Value Date   ALBUMIN 3.5 04/12/2023   ALBUMIN 3.1 (L) 02/06/2023   ALBUMIN 3.5 12/04/2022    No results found for: "MG" No results found for: "VD25OH"  No results found for: "PREALBUMIN"    Latest Ref Rng & Units 04/12/2023   11:58 PM 02/06/2023    2:09 PM 12/05/2022    3:49 AM  CBC EXTENDED  WBC 4.0 - 10.5 K/uL 11.6  10.4  9.2   RBC 3.87 - 5.11 MIL/uL 4.37  4.19  4.13   Hemoglobin 12.0 - 15.0 g/dL 40.9  9.8  9.8   HCT 81.1 - 46.0 % 33.6  32.1  30.9   Platelets 150 - 400 K/uL 304  345  269   NEUT# 1.7 - 7.7 K/uL 7.2     Lymph# 0.7 - 4.0 K/uL 3.2        There is no height or weight on file to calculate BMI.  Orders:  Orders Placed This Encounter  Procedures   Uric acid   No orders of the defined types were placed in this encounter.    Procedures: No procedures performed  Clinical Data: No additional findings.  ROS:  All other systems negative, except as noted in the HPI. Review of Systems  Objective:  Vital Signs: LMP 04/23/2023 (Approximate)   Specialty Comments:  No specialty comments available.  PMFS History: Patient Active Problem List   Diagnosis Date Noted   Left sided numbness 12/04/2022   Lower extremity cellulitis 06/25/2015   Sepsis (HCC) 06/25/2015   Hematuria, undiagnosed cause 06/04/2015   Chronic sinusitis 06/04/2015   Cellulitis 06/04/2015   Microcytic anemia 06/04/2015   Tobacco use 06/04/2015   Cellulitis and abscess of lower extremity 06/04/2015   Incisional pain 05/09/2013   Sterilization consult 03/15/2013   History of cesarean delivery, currently pregnant 11/20/2012   Supervision of other normal pregnancy 11/20/2012   Asthma 10/24/2012   Past Medical History:  Diagnosis Date   Asthma    Incisional pain 05/09/2013   Will rx cipro   Vertigo     History reviewed. No pertinent family history.  Past Surgical History:  Procedure Laterality Date   CESAREAN SECTION     CESAREAN SECTION      CESAREAN SECTION WITH BILATERAL TUBAL LIGATION N/A 04/23/2013   Procedure: CESAREAN SECTION WITH BILATERAL TUBAL LIGATION;  Surgeon: Willodean Rosenthal, MD;  Location: [facility];  Service: Obstetrics;  Laterality: N/A;   Social History   Occupational History   Not on file  Tobacco Use   Smoking status: Former    Current packs/day: 0.25    Types: Cigarettes   Smokeless tobacco: Never  Vaping Use   Vaping status: Never Used  Substance and Sexual Activity   Alcohol use: Yes    Comment: occ   Drug use: No    Types: Benzodiazepines    Comment: denies   Sexual activity: Yes    Birth control/protection: None    Comment: tubal

## 2023-05-22 ENCOUNTER — Telehealth: Payer: Self-pay | Admitting: Orthopedic Surgery

## 2023-05-22 NOTE — Telephone Encounter (Signed)
Pt called requesting a call back with referral sent to Pain Management from Dr Christell Constant. Pt is asking for name and phone number. Pt states she is unable to find the contact info. Pt phone number is [phone].

## 2023-05-23 NOTE — Telephone Encounter (Signed)
I called and lmom gave her the phone Number [phone] for [facility]

## 2023-05-29 ENCOUNTER — Ambulatory Visit (HOSPITAL_COMMUNITY)
Admission: EM | Admit: 2023-05-29 | Discharge: 2023-05-29 | Disposition: A | Discharge status: Home / Self Care | Payer: Medicaid Other | Attending: Emergency Medicine | Admitting: Emergency Medicine

## 2023-05-29 ENCOUNTER — Encounter (HOSPITAL_COMMUNITY): Payer: Self-pay

## 2023-05-29 DIAGNOSIS — M79672 Pain in left foot: Secondary | ICD-10-CM

## 2023-05-29 MED ORDER — NAPROXEN 500 MG PO TABS: 500.0000 mg | ORAL_TABLET | Freq: Two times a day (BID) | ORAL | 0 refills | Status: AC

## 2023-05-29 MED ORDER — DEXAMETHASONE SODIUM PHOSPHATE 10 MG/ML IJ SOLN
10.0000 mg | Freq: Once | INTRAMUSCULAR | Status: AC
  Administered 2023-05-29: 10 mg via INTRAMUSCULAR

## 2023-05-29 MED ORDER — DEXAMETHASONE SODIUM PHOSPHATE 10 MG/ML IJ SOLN
INTRAMUSCULAR | Status: AC
  Filled 2023-05-29: qty 1

## 2023-05-29 NOTE — ED Triage Notes (Signed)
Pt states dx'd with gout 9/26 to lt foot. States was set up with pain management but they haven't call her yet. C/o pain to walk and needs a work note.

## 2023-05-29 NOTE — ED Provider Notes (Signed)
[facility]    CSN: [id number] Arrival date & time: 05/29/23  1628      History   Chief Complaint Chief Complaint  Patient presents with   Foot Pain    HPI Robin Perez is a 37 y.o. female.   Patient presents with continued left foot pain and swelling after being diagnosed with gout and finishing colchicine.  Patient states that she is being set up with pain management but has not received a phone call to set up first appointment.   Foot Pain    Past Medical History:  Diagnosis Date   Asthma    Incisional pain 05/09/2013   Will rx cipro   Vertigo     Patient Active Problem List   Diagnosis Date Noted   Left sided numbness 12/04/2022   Lower extremity cellulitis 06/25/2015   Sepsis (HCC) 06/25/2015   Hematuria, undiagnosed cause 06/04/2015   Chronic sinusitis 06/04/2015   Cellulitis 06/04/2015   Microcytic anemia 06/04/2015   Tobacco use 06/04/2015   Cellulitis and abscess of lower extremity 06/04/2015   Incisional pain 05/09/2013   Sterilization consult 03/15/2013   History of cesarean delivery, currently pregnant 11/20/2012   Supervision of other normal pregnancy 11/20/2012   Asthma 10/24/2012    Past Surgical History:  Procedure Laterality Date   CESAREAN SECTION     CESAREAN SECTION     CESAREAN SECTION WITH BILATERAL TUBAL LIGATION N/A 04/23/2013   Procedure: CESAREAN SECTION WITH BILATERAL TUBAL LIGATION;  Surgeon: Willodean Rosenthal, MD;  Location: [facility];  Service: Obstetrics;  Laterality: N/A;    OB History     Gravida  3   Para  3   Term  3   Preterm      AB      Living  3      SAB      IAB      Ectopic      Multiple      Live Births  3            Home Medications    Prior to Admission medications   Medication Sig Start Date End Date Taking? Authorizing Provider  naproxen (NAPROSYN) 500 MG tablet Take 1 tablet (500 mg total) by mouth 2 (two) times daily. 05/29/23  Yes Susann Givens, Joeanthony Seeling A, NP   allopurinol (ZYLOPRIM) 100 MG tablet Take 1 tablet (100 mg total) by mouth 2 (two) times daily. 05/19/23   Nadara Mustard, MD  cetirizine (ZYRTEC ALLERGY) 10 MG tablet Take 1 tablet (10 mg total) by mouth daily. Patient not taking: Reported on 05/15/2023 05/07/23   Wynonia Lawman A, NP  colchicine 0.6 MG tablet Take 1 tablet (0.6 mg total) by mouth daily. Use as needed for any acute pain 05/19/23   Nadara Mustard, MD  fluticasone [facility]) 50 MCG/ACT nasal spray Place 2 sprays into both nostrils daily. 05/07/23   Wynonia Lawman A, NP  methocarbamol (ROBAXIN) 500 MG tablet Take 1 tablet (500 mg total) by mouth 2 (two) times daily as needed for muscle spasms. 05/15/23   Letta Kocher, NP    Family History History reviewed. No pertinent family history.  Social History Social History   Tobacco Use   Smoking status: Former    Current packs/day: 0.25    Types: Cigarettes   Smokeless tobacco: Never  Vaping Use   Vaping status: Never Used  Substance Use Topics   Alcohol use: Yes    Comment: occ   Drug  use: No    Types: Benzodiazepines    Comment: denies     Allergies   Patient has no known allergies.   Review of Systems Review of Systems  Musculoskeletal:  Positive for gait problem, joint swelling and myalgias.     Physical Exam Triage Vital Signs ED Triage Vitals  Encounter Vitals Group     BP 05/29/23 1731 138/86     Systolic BP Percentile --      Diastolic BP Percentile --      Pulse Rate 05/29/23 1731 76     Resp 05/29/23 1731 18     Temp 05/29/23 1731 98.5 F (36.9 C)     Temp Source 05/29/23 1731 Oral     SpO2 05/29/23 1731 97 %     Weight --      Height --      Head Circumference --      Peak Flow --      Pain Score 05/29/23 1733 9     Pain Loc --      Pain Education --      Exclude from Growth Chart --    No data found.  Updated Vital Signs BP 138/86 (BP Location: Right Arm)   Pulse 76   Temp 98.5 F (36.9 C) (Oral)   Resp 18   LMP 05/24/2023  (Approximate)   SpO2 97%   Visual Acuity Right Eye Distance:   Left Eye Distance:   Bilateral Distance:    Right Eye Near:   Left Eye Near:    Bilateral Near:     Physical Exam Vitals and nursing note reviewed.  Constitutional:      General: She is awake. She is not in acute distress.    Appearance: Normal appearance. She is well-developed and well-groomed. She is not ill-appearing, toxic-appearing or diaphoretic.  Cardiovascular:     Pulses:          Dorsalis pedis pulses are 2+ on the left side.  Musculoskeletal:     Left foot: Normal range of motion. No deformity.  Feet:     Left foot:     Skin integrity: No erythema or warmth.     Comments: Mild swelling and tenderness to generalized left foot pain. Skin:    General: Skin is warm and dry.  Neurological:     Mental Status: She is alert.  Psychiatric:        Behavior: Behavior is cooperative.      UC Treatments / Results  Labs (all labs ordered are listed, but only abnormal results are displayed) Labs Reviewed - No data to display  EKG   Radiology No results found.  Procedures Procedures (including critical care time)  Medications Ordered in UC Medications  dexamethasone (DECADRON) injection 10 mg (10 mg Intramuscular Given 05/29/23 1826)    Initial Impression / Assessment and Plan / UC Course  I have reviewed the triage vital signs and the nursing notes.  Pertinent labs & imaging results that were available during my care of the patient were reviewed by me and considered in my medical decision making (see chart for details).     Patient presented with continued left foot pain and swelling after being diagnosed with gout in addition colchicine.  Patient is being set up with pain management but has not received a phone call to set up for his appointment.  Upon assessment patient has mild swelling and tenderness to generalized left foot.  Given IM Decadron to help with inflammation.  Prescribed naproxen  as needed for pain.  Discussed follow-up and return precautions. Final Clinical Impressions(s) / UC Diagnoses   Final diagnoses:  Foot pain, left     Discharge Instructions      He can take naproxen twice daily as needed for pain.  Do not take this with other NSAIDs including ibuprofen, Motrin, Advil, and Aleve.  You can alternate with Tylenol as needed for pain.  Follow-up with pain management as needed.  Return here as needed.    ED Prescriptions     Medication Sig Dispense Auth. Provider   naproxen (NAPROSYN) 500 MG tablet Take 1 tablet (500 mg total) by mouth 2 (two) times daily. 30 tablet Wynonia Lawman A, NP      PDMP not reviewed this encounter.   Wynonia Lawman A, NP 05/29/23 1902

## 2023-05-29 NOTE — Discharge Instructions (Signed)
He can take naproxen twice daily as needed for pain.  Do not take this with other NSAIDs including ibuprofen, Motrin, Advil, and Aleve.  You can alternate with Tylenol as needed for pain.  Follow-up with pain management as needed.  Return here as needed.

## 2023-06-15 ENCOUNTER — Ambulatory Visit: Payer: Medicaid Other | Admitting: Orthopedic Surgery

## 2023-06-15 NOTE — Plan of Care (Signed)
[facility] Tonsillectomy/Adenoidectomy, Postoperative PEDS care plan entered in error.

## 2023-06-17 ENCOUNTER — Encounter (HOSPITAL_COMMUNITY): Payer: Self-pay | Admitting: Emergency Medicine

## 2023-06-17 ENCOUNTER — Ambulatory Visit (HOSPITAL_COMMUNITY)
Admission: EM | Admit: 2023-06-17 | Discharge: 2023-06-17 | Disposition: A | Discharge status: Home / Self Care | Payer: Medicaid Other | Attending: Emergency Medicine | Admitting: Emergency Medicine

## 2023-06-17 DIAGNOSIS — R1114 Bilious vomiting: Secondary | ICD-10-CM | POA: Diagnosis not present

## 2023-06-17 DIAGNOSIS — H60391 Other infective otitis externa, right ear: Secondary | ICD-10-CM | POA: Diagnosis not present

## 2023-06-17 MED ORDER — ONDANSETRON HCL 4 MG PO TABS: 4.0000 mg | ORAL_TABLET | Freq: Three times a day (TID) | ORAL | 0 refills | Status: AC | PRN

## 2023-06-17 MED ORDER — NEOMYCIN-POLYMYXIN-HC 3.5-10000-1 OT SUSP: 4.0000 [drp] | Freq: Three times a day (TID) | OTIC | 0 refills | Status: AC

## 2023-06-17 NOTE — ED Triage Notes (Signed)
Pt was spraying garage yesterday for ants with pesticide. Pt had dizziness, n/v since.   Pt also concerned about fluid that is coming out her right ear.

## 2023-06-17 NOTE — Discharge Instructions (Addendum)
Get plenty of rest and push fluids Zofran was prescribed for nausea Take the medication 30 minutes before drinking or eating Ciprodex was prescribed for possible otitis externa Use medications daily  Use OTC medications like ibuprofen or tylenol as needed fever or pain Call or go to the ED if you have any new or worsening symptoms such as fever, worsening cough, shortness of breath, chest tightness, chest pain, turning blue, changes in mental status, etc..Marland Kitchen

## 2023-06-17 NOTE — ED Provider Notes (Signed)
[facility]   [id number] 06/17/23 Arrival Time: 1258   Chief Complaint  Patient presents with   Nausea   Dizziness   Emesis     SUBJECTIVE: History from: patient.  Robin Perez is a 37 y.o. female who presents to the urgent care with complaint of nausea, vomiting and dizziness that has resolved.  Reports she may have developed a symptom after spraying her  garbage. Has vomited couple times. Is unable to remember the chemical that was used to spray the garage door.  Has tried OTC medication without relief.  Reports she is here to have something to help control her nausea.  Denies any aggravating or alleviating factor.  Denies similar symptoms in the past..   Denies fever, chills, fatigue, sinus pain, rhinorrhea, sore throat, SOB, wheezing, chest pain, changes in bowel or bladder habits.     ROS: As per HPI.  All other pertinent ROS negative.      Past Medical History:  Diagnosis Date   Asthma    Incisional pain 05/09/2013   Will rx cipro   Vertigo    Past Surgical History:  Procedure Laterality Date   CESAREAN SECTION     CESAREAN SECTION     CESAREAN SECTION WITH BILATERAL TUBAL LIGATION N/A 04/23/2013   Procedure: CESAREAN SECTION WITH BILATERAL TUBAL LIGATION;  Surgeon: Willodean Rosenthal, MD;  Location: [facility];  Service: Obstetrics;  Laterality: N/A;   No Known Allergies No current facility-administered medications on file prior to encounter.   Current Outpatient Medications on File Prior to Encounter  Medication Sig Dispense Refill   allopurinol (ZYLOPRIM) 100 MG tablet Take 1 tablet (100 mg total) by mouth 2 (two) times daily. 60 tablet 1   cetirizine (ZYRTEC ALLERGY) 10 MG tablet Take 1 tablet (10 mg total) by mouth daily. (Patient not taking: Reported on 05/15/2023) 30 tablet 0   colchicine 0.6 MG tablet Take 1 tablet (0.6 mg total) by mouth daily. Use as needed for any acute pain 30 tablet 1   fluticasone (FLONASE) 50 MCG/ACT nasal spray Place 2  sprays into both nostrils daily. 11.1 mL 1   methocarbamol (ROBAXIN) 500 MG tablet Take 1 tablet (500 mg total) by mouth 2 (two) times daily as needed for muscle spasms. 20 tablet 0   naproxen (NAPROSYN) 500 MG tablet Take 1 tablet (500 mg total) by mouth 2 (two) times daily. 30 tablet 0   Social History   Socioeconomic History   Marital status: Single    Spouse name: Not on file   Number of children: Not on file   Years of education: Not on file   Highest education level: Not on file  Occupational History   Not on file  Tobacco Use   Smoking status: Former    Current packs/day: 0.25    Types: Cigarettes   Smokeless tobacco: Never  Vaping Use   Vaping status: Never Used  Substance and Sexual Activity   Alcohol use: Yes    Comment: occ   Drug use: No    Types: Benzodiazepines    Comment: denies   Sexual activity: Yes    Birth control/protection: None    Comment: tubal  Other Topics Concern   Not on file  Social History Narrative   Not on file   Social Determinants of Health   Financial Resource Strain: Not on file  Food Insecurity: No Food Insecurity (12/05/2022)   Hunger Vital Sign    Worried About Running Out of Food in  the Last Year: Never true    Ran Out of Food in the Last Year: Never true  Transportation Needs: No Transportation Needs (12/05/2022)   PRAPARE - Administrator, Civil Service (Medical): No    Lack of Transportation (Non-Medical): No  Physical Activity: Not on file  Stress: Not on file  Social Connections: Unknown (12/19/2021)   Received from [facility], [facility]   Social Network    Social Network: Not on file  Intimate Partner Violence: Not At Risk (12/05/2022)   Humiliation, Afraid, Rape, and Kick questionnaire    Fear of Current or Ex-Partner: No    Emotionally Abused: No    Physically Abused: No    Sexually Abused: No   No family history on file.  OBJECTIVE:  Vitals:   06/17/23 1348  BP: (!) 135/90  Pulse: 92   Resp: 17  Temp: 97.9 F (36.6 C)  TempSrc: Oral  SpO2: 96%     Physical Exam Vitals and nursing note reviewed.  Constitutional:      General: She is not in acute distress.    Appearance: Normal appearance. She is normal weight. She is not ill-appearing, toxic-appearing or diaphoretic.  HENT:     Head: Normocephalic.     Right Ear: Drainage present. No middle ear effusion. Tympanic membrane is not erythematous or bulging.     Left Ear: Tympanic membrane, ear canal and external ear normal. There is no impacted cerumen.     Ears:     Comments: Report clear drainage from the right ear Cardiovascular:     Rate and Rhythm: Normal rate and regular rhythm.     Pulses: Normal pulses.     Heart sounds: Normal heart sounds. No murmur heard.    No friction rub. No gallop.  Pulmonary:     Effort: Pulmonary effort is normal. No respiratory distress.     Breath sounds: Normal breath sounds. No stridor. No wheezing, rhonchi or rales.  Chest:     Chest wall: No tenderness.  Neurological:     Mental Status: She is alert and oriented to person, place, and time.     Cranial Nerves: Cranial nerves 2-12 are intact.     Sensory: Sensation is intact.     Motor: Motor function is intact.     Coordination: Coordination is intact.      LABS:  No results found for this or any previous visit (from the past 24 hour(s)).   ASSESSMENT & PLAN:  1. Bilious vomiting with nausea   2. Infective otitis externa of right ear     Meds ordered this encounter  Medications   neomycin-polymyxin-hydrocortisone (CORTISPORIN) 3.5-10000-1 OTIC suspension    Sig: Place 4 drops into the right ear 3 (three) times daily. for 7 days    Dispense:  10 mL    Refill:  0   ondansetron (ZOFRAN) 4 MG tablet    Sig: Take 1 tablet (4 mg total) by mouth every 8 (eight) hours as needed for up to 10 days for nausea or vomiting.    Dispense:  20 tablet    Refill:  0  Member reports she is not pregnant   Discharge  instructions  Get plenty of rest and push fluids Zofran was prescribed for nausea Take the medication 30 minutes before drinking or eating Ciprodex was prescribed for possible otitis externa Use medications daily  Use OTC medications like ibuprofen or tylenol as needed fever or pain Call or go to the  ED if you have any new or worsening symptoms such as fever, worsening cough, shortness of breath, chest tightness, chest pain, turning blue, changes in mental status, etc...   Reviewed expectations re: course of current medical issues. Questions answered. Outlined signs and symptoms indicating need for more acute intervention. Patient verbalized understanding. After Visit Summary given.          Durward Parcel, FNP 06/17/23 1444

## 2023-06-27 ENCOUNTER — Emergency Department (HOSPITAL_COMMUNITY): Payer: Medicaid Other

## 2023-06-27 ENCOUNTER — Other Ambulatory Visit: Payer: Self-pay

## 2023-06-27 ENCOUNTER — Emergency Department (HOSPITAL_COMMUNITY)
Admission: EM | Admit: 2023-06-27 | Discharge: 2023-06-28 | Discharge status: Against Medical Advice | Payer: Medicaid Other

## 2023-06-27 DIAGNOSIS — R079 Chest pain, unspecified: Secondary | ICD-10-CM | POA: Diagnosis not present

## 2023-06-27 DIAGNOSIS — Z5321 Procedure and treatment not carried out due to patient leaving prior to being seen by health care provider: Secondary | ICD-10-CM | POA: Diagnosis not present

## 2023-06-27 DIAGNOSIS — R002 Palpitations: Secondary | ICD-10-CM | POA: Insufficient documentation

## 2023-06-27 LAB — CBC
HCT: 32.9 % — ABNORMAL LOW (ref 36.0–46.0)
Hemoglobin: 10.1 g/dL — ABNORMAL LOW (ref 12.0–15.0)
MCH: 23.5 pg — ABNORMAL LOW (ref 26.0–34.0)
MCHC: 30.7 g/dL (ref 30.0–36.0)
MCV: 76.7 fL — ABNORMAL LOW (ref 80.0–100.0)
Platelets: 286 10*3/uL (ref 150–400)
RBC: 4.29 MIL/uL (ref 3.87–5.11)
RDW: 17.4 % — ABNORMAL HIGH (ref 11.5–15.5)
WBC: 8 10*3/uL (ref 4.0–10.5)
nRBC: 0 % (ref 0.0–0.2)

## 2023-06-27 LAB — HCG, SERUM, QUALITATIVE: Preg, Serum: NEGATIVE

## 2023-06-27 NOTE — ED Triage Notes (Signed)
Pt c/o heart palpitations that has been ongoing since 7 pm. Reports centralized chest pain.

## 2023-06-28 LAB — BASIC METABOLIC PANEL
Anion gap: 10 (ref 5–15)
BUN: 15 mg/dL (ref 6–20)
CO2: 23 mmol/L (ref 22–32)
Calcium: 9.2 mg/dL (ref 8.9–10.3)
Chloride: 103 mmol/L (ref 98–111)
Creatinine, Ser: 1.21 mg/dL — ABNORMAL HIGH (ref 0.44–1.00)
GFR, Estimated: 60 mL/min — ABNORMAL LOW (ref >60)
Glucose, Bld: 98 mg/dL (ref 70–99)
Potassium: 3.6 mmol/L (ref 3.5–5.1)
Sodium: 136 mmol/L (ref 135–145)

## 2023-06-28 LAB — TROPONIN I (HIGH SENSITIVITY): Troponin I (High Sensitivity): 5 ng/L (ref <18)

## 2023-06-28 NOTE — ED Notes (Signed)
NA for roll call  

## 2023-06-28 NOTE — ED Notes (Signed)
Pt called for repeat trop, no response

## 2024-04-01 ENCOUNTER — Emergency Department (HOSPITAL_COMMUNITY)
Admission: EM | Admit: 2024-04-01 | Discharge: 2024-04-02 | Discharge status: Against Medical Advice | Attending: Emergency Medicine | Admitting: Emergency Medicine

## 2024-04-01 ENCOUNTER — Encounter (HOSPITAL_COMMUNITY): Payer: Self-pay | Admitting: *Deleted

## 2024-04-01 ENCOUNTER — Other Ambulatory Visit: Payer: Self-pay

## 2024-04-01 DIAGNOSIS — R0602 Shortness of breath: Secondary | ICD-10-CM | POA: Insufficient documentation

## 2024-04-01 DIAGNOSIS — Z5321 Procedure and treatment not carried out due to patient leaving prior to being seen by health care provider: Secondary | ICD-10-CM | POA: Insufficient documentation

## 2024-04-01 DIAGNOSIS — R1032 Left lower quadrant pain: Secondary | ICD-10-CM | POA: Diagnosis not present

## 2024-04-01 DIAGNOSIS — R1031 Right lower quadrant pain: Secondary | ICD-10-CM | POA: Diagnosis present

## 2024-04-01 DIAGNOSIS — R350 Frequency of micturition: Secondary | ICD-10-CM | POA: Diagnosis not present

## 2024-04-01 LAB — CBC WITH DIFFERENTIAL/PLATELET
Abs Granulocyte: 7.7 K/uL — ABNORMAL HIGH (ref 1.5–6.5)
Abs Immature Granulocytes: 0.04 K/uL (ref 0.00–0.07)
Basophils Absolute: 0 K/uL (ref 0.0–0.1)
Basophils Relative: 0 %
Eosinophils Absolute: 0.3 K/uL (ref 0.0–0.5)
Eosinophils Relative: 2 %
HCT: 37.1 % (ref 36.0–46.0)
Hemoglobin: 11.5 g/dL — ABNORMAL LOW (ref 12.0–15.0)
Immature Granulocytes: 0 %
Lymphocytes Relative: 22 %
Lymphs Abs: 2.5 K/uL (ref 0.7–4.0)
MCH: 24.4 pg — ABNORMAL LOW (ref 26.0–34.0)
MCHC: 31 g/dL (ref 30.0–36.0)
MCV: 78.8 fL — ABNORMAL LOW (ref 80.0–100.0)
Monocytes Absolute: 1 K/uL (ref 0.1–1.0)
Monocytes Relative: 8 %
Neutro Abs: 7.7 K/uL (ref 1.7–7.7)
Neutrophils Relative %: 68 %
Platelets: 217 K/uL (ref 150–400)
RBC: 4.71 MIL/uL (ref 3.87–5.11)
RDW: 16.3 % — ABNORMAL HIGH (ref 11.5–15.5)
WBC: 11.5 K/uL — ABNORMAL HIGH (ref 4.0–10.5)
nRBC: 0 % (ref 0.0–0.2)

## 2024-04-01 LAB — COMPREHENSIVE METABOLIC PANEL WITH GFR
ALT: 12 U/L (ref 0–44)
AST: 17 U/L (ref 15–41)
Albumin: 3.1 g/dL — ABNORMAL LOW (ref 3.5–5.0)
Alkaline Phosphatase: 89 U/L (ref 38–126)
Anion gap: 11 (ref 5–15)
BUN: 15 mg/dL (ref 6–20)
CO2: 19 mmol/L — ABNORMAL LOW (ref 22–32)
Calcium: 9 mg/dL (ref 8.9–10.3)
Chloride: 104 mmol/L (ref 98–111)
Creatinine, Ser: 1.19 mg/dL — ABNORMAL HIGH (ref 0.44–1.00)
GFR, Estimated: >60 mL/min (ref >60)
Glucose, Bld: 92 mg/dL (ref 70–99)
Potassium: 4 mmol/L (ref 3.5–5.1)
Sodium: 134 mmol/L — ABNORMAL LOW (ref 135–145)
Total Bilirubin: 0.5 mg/dL (ref 0.0–1.2)
Total Protein: 7.2 g/dL (ref 6.5–8.1)

## 2024-04-01 LAB — URINALYSIS, ROUTINE W REFLEX MICROSCOPIC
Bilirubin Urine: NEGATIVE
Glucose, UA: NEGATIVE mg/dL
Ketones, ur: NEGATIVE mg/dL
Leukocytes,Ua: NEGATIVE
Nitrite: NEGATIVE
Protein, ur: 100 mg/dL — AB
Specific Gravity, Urine: 1.019 (ref 1.005–1.030)
pH: 5 (ref 5.0–8.0)

## 2024-04-01 LAB — PREGNANCY, URINE: Preg Test, Ur: NEGATIVE

## 2024-04-01 LAB — LIPASE, BLOOD: Lipase: 37 U/L (ref 11–51)

## 2024-04-01 MED ORDER — ACETAMINOPHEN 325 MG PO TABS: 650.0000 mg | ORAL_TABLET | Freq: Once | ORAL | Status: DC

## 2024-04-01 NOTE — ED Notes (Signed)
 CALLED PT 3X, NO ANSWER

## 2024-04-01 NOTE — ED Notes (Signed)
 Unable to obtain labsx2

## 2024-04-01 NOTE — ED Triage Notes (Addendum)
 Here by POV from home for bilateral lower abd pain. Onset yesterday. Worse today. Believes it is r/t 12-13d late period. LMP 6/28. Mentions nipples tender, and urinary frequency/ urgency after drinking water. Denies fever, back pain, NVD. Denies other urinary or vaginal sx. No relief with ibuprofen . Pt is HOH.

## 2024-04-01 NOTE — ED Notes (Signed)
 PBT attempted blood draw x2, unsuccessful. Urine sent.
